# Patient Record
Sex: Male | Born: 1985 | Race: White | Hispanic: No | Marital: Single | Smoking: Current every day smoker
Health system: Southern US, Community
[De-identification: ages and names within clinical notes are randomized; demographics above are authoritative.]

## PROBLEM LIST (undated history)

## (undated) DIAGNOSIS — Z952 Presence of prosthetic heart valve: Secondary | ICD-10-CM

## (undated) DIAGNOSIS — I2699 Other pulmonary embolism without acute cor pulmonale: Secondary | ICD-10-CM

## (undated) DIAGNOSIS — I519 Heart disease, unspecified: Secondary | ICD-10-CM

## (undated) DIAGNOSIS — B1920 Unspecified viral hepatitis C without hepatic coma: Secondary | ICD-10-CM

## (undated) DIAGNOSIS — D649 Anemia, unspecified: Secondary | ICD-10-CM

## (undated) DIAGNOSIS — F32A Depression, unspecified: Secondary | ICD-10-CM

## (undated) DIAGNOSIS — F1910 Other psychoactive substance abuse, uncomplicated: Secondary | ICD-10-CM

## (undated) DIAGNOSIS — Z8679 Personal history of other diseases of the circulatory system: Secondary | ICD-10-CM

## (undated) DIAGNOSIS — Z8674 Personal history of sudden cardiac arrest: Secondary | ICD-10-CM

## (undated) DIAGNOSIS — R519 Headache, unspecified: Secondary | ICD-10-CM

## (undated) DIAGNOSIS — I428 Other cardiomyopathies: Principal | ICD-10-CM

## (undated) DIAGNOSIS — F329 Major depressive disorder, single episode, unspecified: Secondary | ICD-10-CM

## (undated) DIAGNOSIS — F419 Anxiety disorder, unspecified: Secondary | ICD-10-CM

## (undated) DIAGNOSIS — I219 Acute myocardial infarction, unspecified: Secondary | ICD-10-CM

## (undated) DIAGNOSIS — R51 Headache: Secondary | ICD-10-CM

## (undated) HISTORY — DX: Other cardiomyopathies: I42.8

## (undated) HISTORY — DX: Heart disease, unspecified: I51.9

## (undated) HISTORY — DX: Other pulmonary embolism without acute cor pulmonale: I26.99

## (undated) HISTORY — PX: KNEE ARTHROSCOPY: SHX127

## (undated) HISTORY — DX: Presence of prosthetic heart valve: Z95.2

---

## 2013-01-11 ENCOUNTER — Encounter (HOSPITAL_COMMUNITY): Payer: Self-pay | Admitting: Cardiology

## 2013-01-11 ENCOUNTER — Emergency Department (HOSPITAL_COMMUNITY)
Admission: EM | Admit: 2013-01-11 | Discharge: 2013-01-11 | Disposition: A | Discharge status: Home / Self Care | Payer: BC Managed Care – PPO | Attending: Emergency Medicine | Admitting: Emergency Medicine

## 2013-01-11 DIAGNOSIS — F1910 Other psychoactive substance abuse, uncomplicated: Secondary | ICD-10-CM | POA: Insufficient documentation

## 2013-01-11 DIAGNOSIS — F17200 Nicotine dependence, unspecified, uncomplicated: Secondary | ICD-10-CM | POA: Insufficient documentation

## 2013-01-11 LAB — COMPREHENSIVE METABOLIC PANEL
AST: 31 U/L (ref 0–37)
Albumin: 4.4 g/dL (ref 3.5–5.2)
CO2: 31 mEq/L (ref 19–32)
Calcium: 9.7 mg/dL (ref 8.4–10.5)
Creatinine, Ser: 0.89 mg/dL (ref 0.50–1.35)
GFR calc non Af Amer: >90 mL/min (ref >90)
Total Protein: 7.6 g/dL (ref 6.0–8.3)

## 2013-01-11 LAB — CBC
MCH: 29.5 pg (ref 26.0–34.0)
MCHC: 34 g/dL (ref 30.0–36.0)
MCV: 86.9 fL (ref 78.0–100.0)
Platelets: 292 10*3/uL (ref 150–400)
RDW: 13.5 % (ref 11.5–15.5)

## 2013-01-11 LAB — RAPID URINE DRUG SCREEN, HOSP PERFORMED
Amphetamines: NOT DETECTED
Opiates: NOT DETECTED

## 2013-01-11 LAB — SALICYLATE LEVEL: Salicylate Lvl: <2 mg/dL — ABNORMAL LOW (ref 2.8–20.0)

## 2013-01-11 NOTE — ED Provider Notes (Signed)
Medical screening examination/treatment/procedure(s) were performed by non-physician practitioner and as supervising physician I was immediately available for consultation/collaboration.  Doug Sou, MD 01/11/13 [phone]

## 2013-01-11 NOTE — ED Notes (Signed)
PJ with ACT team at bedside.

## 2013-01-11 NOTE — ED Provider Notes (Signed)
History     CSN: [id number]  Arrival date & time 01/11/13  1204   First MD Initiated Contact with Patient 01/11/13 1232      Chief Complaint  Patient presents with  . Medical Clearance    (Consider location/radiation/quality/duration/timing/severity/associated sxs/prior treatment) HPI Comments: Patient is a 27 year old male who presents for medical clearance for substance abuse. Patient reports abusing prescription pain medication such as Percocet. Patient reports using them for 3 years. He also admits to using crystal meth occasionally. Patient denies alcohol abuse. He reports not using since November 29, 2012 but would like to be medically cleared to enter a treatment program. Patient denies SI/HI. No medical problems. No associated symptoms.    History reviewed. No pertinent past medical history.  History reviewed. No pertinent past surgical history.  History reviewed. No pertinent family history.  History  Substance Use Topics  . Smoking status: Current Every Day Smoker  . Smokeless tobacco: Not on file  . Alcohol Use: No      Review of Systems  Psychiatric/Behavioral:       Substance abuse.   All other systems reviewed and are negative.    Allergies  Penicillins  Home Medications  No current outpatient prescriptions on file.  BP 123/73  Pulse 87  Temp 98.1 F (36.7 C) (Oral)  Resp 20  SpO2 99%  Physical Exam  Nursing note and vitals reviewed. Constitutional: He is oriented to person, place, and time. He appears well-developed and well-nourished. No distress.  HENT:  Head: Normocephalic and atraumatic.  Eyes: Conjunctivae normal and EOM are normal.  Neck: Normal range of motion.  Cardiovascular: Normal rate and regular rhythm.  Exam reveals no gallop and no friction rub.   No murmur heard. Pulmonary/Chest: Effort normal and breath sounds normal. He has no wheezes. He has no rales. He exhibits no tenderness.  Abdominal: Soft. There is no tenderness.    Musculoskeletal: Normal range of motion.  Neurological: He is alert and oriented to person, place, and time. Coordination normal.       Speech is goal-oriented. Moves limbs without ataxia.   Skin: Skin is warm and dry.  Psychiatric: He has a normal mood and affect. His behavior is normal.    ED Course  Procedures (including critical care time)  Labs Reviewed  COMPREHENSIVE METABOLIC PANEL - Abnormal; Notable for the following:    Glucose, Bld 133 (*)     All other components within normal limits  SALICYLATE LEVEL - Abnormal; Notable for the following:    Salicylate Lvl <2.0 (*)     All other components within normal limits  ACETAMINOPHEN LEVEL  CBC  ETHANOL  URINE RAPID DRUG SCREEN (HOSP PERFORMED)   No results found.   1. Polysubstance abuse       MDM  3:26 PM Patient is medically cleared and will move to Pod C for ACT team assessment.    3:50 PM ACT team saw the patient who reports the patient would be most appropriate for outpatient resources. He would not benefit from staying here or inpatient treatment. Baxter Hire will talk to the patient to discuss resources and he will be discharged without further evaluation.      Emilia Beck, PA-C 01/11/13 [address], PA-C 01/11/13 1556

## 2013-01-11 NOTE — ED Notes (Signed)
Pt is here for medical clearance to get into a rehab program.

## 2013-01-11 NOTE — ED Notes (Signed)
Blood tube selection confirmed by lindsay/rn.

## 2013-01-11 NOTE — ED Notes (Signed)
Maxwell Lemen (Mom) [phone], Jachob Mcclean (Dad) [phone] request to be contacted if pt finds placement or change in status.

## 2013-01-11 NOTE — ED Notes (Signed)
Pt here stating that he needs to be medically cleared for acceptance into a rehab facility. States he was using prescription drugs but has been clean since December. States he has been looking into places but has not been accepted yet. Denies any SI/HI. Pt calm and cooperative at triage.

## 2013-01-11 NOTE — ED Notes (Signed)
Discharge instructions reviewed. Pt verbalized understanding of follow up instructions.

## 2014-05-23 ENCOUNTER — Other Ambulatory Visit: Payer: Self-pay | Admitting: Radiology

## 2015-05-22 DIAGNOSIS — B1920 Unspecified viral hepatitis C without hepatic coma: Secondary | ICD-10-CM | POA: Insufficient documentation

## 2015-05-24 DIAGNOSIS — R1011 Right upper quadrant pain: Secondary | ICD-10-CM | POA: Insufficient documentation

## 2015-06-19 DIAGNOSIS — R7881 Bacteremia: Secondary | ICD-10-CM | POA: Insufficient documentation

## 2015-10-16 DIAGNOSIS — I5189 Other ill-defined heart diseases: Secondary | ICD-10-CM

## 2015-10-16 DIAGNOSIS — I428 Other cardiomyopathies: Secondary | ICD-10-CM

## 2015-10-16 HISTORY — DX: Other cardiomyopathies: I42.8

## 2015-10-16 HISTORY — PX: TRANSTHORACIC ECHOCARDIOGRAM: SHX275

## 2015-10-16 HISTORY — DX: Other ill-defined heart diseases: I51.89

## 2015-11-07 DIAGNOSIS — Z87898 Personal history of other specified conditions: Secondary | ICD-10-CM | POA: Insufficient documentation

## 2015-11-07 DIAGNOSIS — Z8679 Personal history of other diseases of the circulatory system: Secondary | ICD-10-CM | POA: Insufficient documentation

## 2015-11-07 DIAGNOSIS — E869 Volume depletion, unspecified: Secondary | ICD-10-CM | POA: Insufficient documentation

## 2015-11-07 DIAGNOSIS — J181 Lobar pneumonia, unspecified organism: Secondary | ICD-10-CM

## 2015-11-07 DIAGNOSIS — J189 Pneumonia, unspecified organism: Secondary | ICD-10-CM | POA: Insufficient documentation

## 2015-11-07 DIAGNOSIS — R609 Edema, unspecified: Secondary | ICD-10-CM | POA: Insufficient documentation

## 2015-11-08 DIAGNOSIS — F1110 Opioid abuse, uncomplicated: Secondary | ICD-10-CM | POA: Insufficient documentation

## 2015-11-08 DIAGNOSIS — I2690 Septic pulmonary embolism without acute cor pulmonale: Secondary | ICD-10-CM | POA: Insufficient documentation

## 2015-11-08 DIAGNOSIS — F1990 Other psychoactive substance use, unspecified, uncomplicated: Secondary | ICD-10-CM | POA: Insufficient documentation

## 2015-11-08 DIAGNOSIS — F419 Anxiety disorder, unspecified: Secondary | ICD-10-CM | POA: Insufficient documentation

## 2015-11-10 DIAGNOSIS — Z9889 Other specified postprocedural states: Secondary | ICD-10-CM | POA: Insufficient documentation

## 2015-11-11 DIAGNOSIS — R52 Pain, unspecified: Secondary | ICD-10-CM | POA: Insufficient documentation

## 2015-11-12 DIAGNOSIS — Z22322 Carrier or suspected carrier of Methicillin resistant Staphylococcus aureus: Secondary | ICD-10-CM | POA: Insufficient documentation

## 2015-11-13 DIAGNOSIS — D62 Acute posthemorrhagic anemia: Secondary | ICD-10-CM | POA: Insufficient documentation

## 2015-11-15 DIAGNOSIS — Z952 Presence of prosthetic heart valve: Secondary | ICD-10-CM

## 2015-11-15 HISTORY — DX: Presence of prosthetic heart valve: Z95.2

## 2015-11-15 HISTORY — PX: CARDIOVERSION: SHX1299

## 2015-11-15 HISTORY — PX: CARDIAC VALVE REPLACEMENT: SHX585

## 2015-11-15 HISTORY — PX: CARDIAC CATHETERIZATION: SHX172

## 2015-11-24 DIAGNOSIS — J90 Pleural effusion, not elsewhere classified: Secondary | ICD-10-CM | POA: Insufficient documentation

## 2016-01-16 HISTORY — PX: TRANSTHORACIC ECHOCARDIOGRAM: SHX275

## 2016-01-21 DIAGNOSIS — R0602 Shortness of breath: Secondary | ICD-10-CM | POA: Insufficient documentation

## 2016-04-07 DIAGNOSIS — I480 Paroxysmal atrial fibrillation: Secondary | ICD-10-CM | POA: Insufficient documentation

## 2017-07-22 DIAGNOSIS — A419 Sepsis, unspecified organism: Secondary | ICD-10-CM

## 2017-07-22 DIAGNOSIS — Z952 Presence of prosthetic heart valve: Secondary | ICD-10-CM | POA: Diagnosis not present

## 2017-07-22 DIAGNOSIS — N179 Acute kidney failure, unspecified: Secondary | ICD-10-CM | POA: Diagnosis not present

## 2017-07-22 DIAGNOSIS — F1510 Other stimulant abuse, uncomplicated: Secondary | ICD-10-CM | POA: Diagnosis not present

## 2017-07-22 DIAGNOSIS — E871 Hypo-osmolality and hyponatremia: Secondary | ICD-10-CM

## 2017-07-22 DIAGNOSIS — I2699 Other pulmonary embolism without acute cor pulmonale: Secondary | ICD-10-CM | POA: Diagnosis not present

## 2017-07-23 DIAGNOSIS — I2699 Other pulmonary embolism without acute cor pulmonale: Secondary | ICD-10-CM | POA: Diagnosis not present

## 2017-07-23 DIAGNOSIS — R509 Fever, unspecified: Secondary | ICD-10-CM | POA: Diagnosis not present

## 2017-07-23 DIAGNOSIS — Z952 Presence of prosthetic heart valve: Secondary | ICD-10-CM | POA: Diagnosis not present

## 2017-07-23 DIAGNOSIS — F1510 Other stimulant abuse, uncomplicated: Secondary | ICD-10-CM | POA: Diagnosis not present

## 2017-07-23 DIAGNOSIS — E871 Hypo-osmolality and hyponatremia: Secondary | ICD-10-CM | POA: Diagnosis not present

## 2017-07-23 DIAGNOSIS — N179 Acute kidney failure, unspecified: Secondary | ICD-10-CM | POA: Diagnosis not present

## 2017-07-23 DIAGNOSIS — A419 Sepsis, unspecified organism: Secondary | ICD-10-CM | POA: Diagnosis not present

## 2017-07-24 DIAGNOSIS — N179 Acute kidney failure, unspecified: Secondary | ICD-10-CM | POA: Diagnosis not present

## 2017-07-24 DIAGNOSIS — Z952 Presence of prosthetic heart valve: Secondary | ICD-10-CM | POA: Diagnosis not present

## 2017-07-24 DIAGNOSIS — E871 Hypo-osmolality and hyponatremia: Secondary | ICD-10-CM | POA: Diagnosis not present

## 2017-07-24 DIAGNOSIS — A419 Sepsis, unspecified organism: Secondary | ICD-10-CM | POA: Diagnosis not present

## 2017-07-25 DIAGNOSIS — A419 Sepsis, unspecified organism: Secondary | ICD-10-CM | POA: Diagnosis not present

## 2017-07-25 DIAGNOSIS — Z952 Presence of prosthetic heart valve: Secondary | ICD-10-CM | POA: Diagnosis not present

## 2017-07-25 DIAGNOSIS — E871 Hypo-osmolality and hyponatremia: Secondary | ICD-10-CM | POA: Diagnosis not present

## 2017-07-25 DIAGNOSIS — N179 Acute kidney failure, unspecified: Secondary | ICD-10-CM | POA: Diagnosis not present

## 2017-07-26 ENCOUNTER — Inpatient Hospital Stay (HOSPITAL_COMMUNITY)
Admission: AD | Admit: 2017-07-26 | Discharge: 2017-07-31 | DRG: 314 | Discharge status: Against Medical Advice | Payer: BLUE CROSS/BLUE SHIELD | Source: Other Acute Inpatient Hospital | Attending: Internal Medicine | Admitting: Internal Medicine

## 2017-07-26 ENCOUNTER — Encounter (HOSPITAL_COMMUNITY): Payer: Self-pay | Admitting: Internal Medicine

## 2017-07-26 DIAGNOSIS — T80219A Unspecified infection due to central venous catheter, initial encounter: Secondary | ICD-10-CM | POA: Diagnosis present

## 2017-07-26 DIAGNOSIS — T826XXA Infection and inflammatory reaction due to cardiac valve prosthesis, initial encounter: Principal | ICD-10-CM | POA: Diagnosis present

## 2017-07-26 DIAGNOSIS — G894 Chronic pain syndrome: Secondary | ICD-10-CM | POA: Diagnosis present

## 2017-07-26 DIAGNOSIS — Z952 Presence of prosthetic heart valve: Secondary | ICD-10-CM

## 2017-07-26 DIAGNOSIS — Z880 Allergy status to penicillin: Secondary | ICD-10-CM | POA: Diagnosis not present

## 2017-07-26 DIAGNOSIS — D696 Thrombocytopenia, unspecified: Secondary | ICD-10-CM | POA: Diagnosis present

## 2017-07-26 DIAGNOSIS — I2699 Other pulmonary embolism without acute cor pulmonale: Secondary | ICD-10-CM | POA: Diagnosis present

## 2017-07-26 DIAGNOSIS — Z9114 Patient's other noncompliance with medication regimen: Secondary | ICD-10-CM | POA: Diagnosis not present

## 2017-07-26 DIAGNOSIS — F17200 Nicotine dependence, unspecified, uncomplicated: Secondary | ICD-10-CM | POA: Diagnosis not present

## 2017-07-26 DIAGNOSIS — Y831 Surgical operation with implant of artificial internal device as the cause of abnormal reaction of the patient, or of later complication, without mention of misadventure at the time of the procedure: Secondary | ICD-10-CM | POA: Diagnosis present

## 2017-07-26 DIAGNOSIS — F1590 Other stimulant use, unspecified, uncomplicated: Secondary | ICD-10-CM | POA: Diagnosis present

## 2017-07-26 DIAGNOSIS — R451 Restlessness and agitation: Secondary | ICD-10-CM | POA: Diagnosis not present

## 2017-07-26 DIAGNOSIS — Z8674 Personal history of sudden cardiac arrest: Secondary | ICD-10-CM

## 2017-07-26 DIAGNOSIS — B9561 Methicillin susceptible Staphylococcus aureus infection as the cause of diseases classified elsewhere: Secondary | ICD-10-CM

## 2017-07-26 DIAGNOSIS — N179 Acute kidney failure, unspecified: Secondary | ICD-10-CM | POA: Diagnosis not present

## 2017-07-26 DIAGNOSIS — Y848 Other medical procedures as the cause of abnormal reaction of the patient, or of later complication, without mention of misadventure at the time of the procedure: Secondary | ICD-10-CM | POA: Diagnosis present

## 2017-07-26 DIAGNOSIS — I330 Acute and subacute infective endocarditis: Secondary | ICD-10-CM | POA: Diagnosis present

## 2017-07-26 DIAGNOSIS — F1910 Other psychoactive substance abuse, uncomplicated: Secondary | ICD-10-CM | POA: Diagnosis not present

## 2017-07-26 DIAGNOSIS — Z79899 Other long term (current) drug therapy: Secondary | ICD-10-CM

## 2017-07-26 DIAGNOSIS — L52 Erythema nodosum: Secondary | ICD-10-CM | POA: Diagnosis present

## 2017-07-26 DIAGNOSIS — R197 Diarrhea, unspecified: Secondary | ICD-10-CM | POA: Diagnosis not present

## 2017-07-26 DIAGNOSIS — A419 Sepsis, unspecified organism: Secondary | ICD-10-CM | POA: Diagnosis present

## 2017-07-26 DIAGNOSIS — B182 Chronic viral hepatitis C: Secondary | ICD-10-CM | POA: Diagnosis present

## 2017-07-26 DIAGNOSIS — I5022 Chronic systolic (congestive) heart failure: Secondary | ICD-10-CM | POA: Diagnosis present

## 2017-07-26 DIAGNOSIS — R791 Abnormal coagulation profile: Secondary | ICD-10-CM | POA: Diagnosis present

## 2017-07-26 DIAGNOSIS — A4101 Sepsis due to Methicillin susceptible Staphylococcus aureus: Secondary | ICD-10-CM | POA: Diagnosis not present

## 2017-07-26 DIAGNOSIS — F419 Anxiety disorder, unspecified: Secondary | ICD-10-CM | POA: Diagnosis not present

## 2017-07-26 DIAGNOSIS — D649 Anemia, unspecified: Secondary | ICD-10-CM | POA: Diagnosis not present

## 2017-07-26 DIAGNOSIS — E871 Hypo-osmolality and hyponatremia: Secondary | ICD-10-CM | POA: Diagnosis present

## 2017-07-26 DIAGNOSIS — T82594A Other mechanical complication of infusion catheter, initial encounter: Secondary | ICD-10-CM

## 2017-07-26 DIAGNOSIS — Z954 Presence of other heart-valve replacement: Secondary | ICD-10-CM

## 2017-07-26 DIAGNOSIS — I480 Paroxysmal atrial fibrillation: Secondary | ICD-10-CM | POA: Diagnosis present

## 2017-07-26 DIAGNOSIS — F1123 Opioid dependence with withdrawal: Secondary | ICD-10-CM | POA: Diagnosis not present

## 2017-07-26 DIAGNOSIS — I38 Endocarditis, valve unspecified: Secondary | ICD-10-CM

## 2017-07-26 HISTORY — DX: Anemia, unspecified: D64.9

## 2017-07-26 HISTORY — DX: Unspecified viral hepatitis C without hepatic coma: B19.20

## 2017-07-26 HISTORY — DX: Other psychoactive substance abuse, uncomplicated: F19.10

## 2017-07-26 HISTORY — DX: Personal history of sudden cardiac arrest: Z86.74

## 2017-07-26 LAB — COMPREHENSIVE METABOLIC PANEL
ALBUMIN: 2.9 g/dL — AB (ref 3.5–5.0)
ALK PHOS: 105 U/L (ref 38–126)
ALT: 12 U/L — ABNORMAL LOW (ref 17–63)
AST: 21 U/L (ref 15–41)
Anion gap: 8 (ref 5–15)
BILIRUBIN TOTAL: 2.7 mg/dL — AB (ref 0.3–1.2)
BUN: 9 mg/dL (ref 6–20)
CALCIUM: 8.1 mg/dL — AB (ref 8.9–10.3)
CO2: 24 mmol/L (ref 22–32)
Chloride: 102 mmol/L (ref 101–111)
Creatinine, Ser: 0.93 mg/dL (ref 0.61–1.24)
GFR calc Af Amer: >60 mL/min (ref >60)
GFR calc non Af Amer: >60 mL/min (ref >60)
GLUCOSE: 114 mg/dL — AB (ref 65–99)
POTASSIUM: 3.6 mmol/L (ref 3.5–5.1)
SODIUM: 134 mmol/L — AB (ref 135–145)
Total Protein: 6.2 g/dL — ABNORMAL LOW (ref 6.5–8.1)

## 2017-07-26 LAB — MAGNESIUM: Magnesium: 1.5 mg/dL — ABNORMAL LOW (ref 1.7–2.4)

## 2017-07-26 LAB — PROTIME-INR
INR: 3.8
Prothrombin Time: 38.4 seconds — ABNORMAL HIGH (ref 11.4–15.2)

## 2017-07-26 MED ORDER — ACETAMINOPHEN 325 MG PO TABS
650.0000 mg | ORAL_TABLET | Freq: Four times a day (QID) | ORAL | Status: DC | PRN
  Administered 2017-07-27 (×2): 650 mg via ORAL
  Filled 2017-07-26 (×2): qty 2

## 2017-07-26 MED ORDER — KETOROLAC TROMETHAMINE 15 MG/ML IJ SOLN
15.0000 mg | Freq: Once | INTRAMUSCULAR | Status: AC
  Administered 2017-07-26: 15 mg via INTRAVENOUS
  Filled 2017-07-26: qty 1

## 2017-07-26 MED ORDER — MAGNESIUM SULFATE 4 GM/100ML IV SOLN
4.0000 g | Freq: Once | INTRAVENOUS | Status: AC
  Administered 2017-07-27: 4 g via INTRAVENOUS
  Filled 2017-07-26: qty 100

## 2017-07-26 MED ORDER — MORPHINE SULFATE (PF) 2 MG/ML IV SOLN
2.0000 mg | Freq: Once | INTRAVENOUS | Status: AC
  Administered 2017-07-26: 2 mg via INTRAVENOUS
  Filled 2017-07-26: qty 1

## 2017-07-26 MED ORDER — DEXTROSE 5 % IV SOLN
2.0000 g | Freq: Two times a day (BID) | INTRAVENOUS | Status: DC
  Administered 2017-07-26 – 2017-07-29 (×6): 2 g via INTRAVENOUS
  Filled 2017-07-26 (×8): qty 2

## 2017-07-26 MED ORDER — SODIUM CHLORIDE 0.9 % IV BOLUS (SEPSIS)
1000.0000 mL | Freq: Once | INTRAVENOUS | Status: AC
  Administered 2017-07-27: 1000 mL via INTRAVENOUS

## 2017-07-26 MED ORDER — PROCHLORPERAZINE EDISYLATE 5 MG/ML IJ SOLN
10.0000 mg | Freq: Four times a day (QID) | INTRAMUSCULAR | Status: DC | PRN
  Filled 2017-07-26: qty 2

## 2017-07-26 MED ORDER — POTASSIUM CHLORIDE IN NACL 20-0.9 MEQ/L-% IV SOLN
INTRAVENOUS | Status: DC
  Administered 2017-07-27: 1000 mL via INTRAVENOUS
  Filled 2017-07-26: qty 1000

## 2017-07-26 MED ORDER — POTASSIUM CHLORIDE CRYS ER 20 MEQ PO TBCR
20.0000 meq | EXTENDED_RELEASE_TABLET | Freq: Once | ORAL | Status: AC
  Administered 2017-07-27: 20 meq via ORAL
  Filled 2017-07-26: qty 1

## 2017-07-26 NOTE — H&P (Signed)
History and Physical    Norman Reed RVU:[id number] DOB: 02/17/1986 DOA: 07/26/2017  PCP: No primary care provider on file.   Patient coming from: [facility] ICU.  I have personally briefly reviewed patient's old medical records in [facility] Health Link  Chief Complaint: MSSA endocarditis.  HPI: Norman Reed is a 31 y.o. male with medical history significant of anemia, chronic systolic heart failure, history of hepatitis C, history of cardiac arrest, history of mitral valve and tricuspid valve repair at [facility] 2 years ago by Dr. Lamonte Sakai, history of IVDA and polysubstance abuse who was admitted on 07/22/2017 to [facility] with complaints of fever, chills, malaise, myalgias, emesis and intermittent chest pain for 2-3 days. Prior to that, he was not taking his carvedilol and warfarin for several weeks. He has been using IV heroin and methamphetamines again.   At [facility], he was treated for the past few days with cefazolin, gentamicin and oral rifampin for and MSSA bacteremia after discussion with infectious diseases. Arrangements for transfer to [facility] were made, but unable to be done due to very tight availability of beds. Echocardiogram showed an EF of 25-30%, a septal bounce, a thickened mechanical mitral valve with agitation measuring 14 x 6 mm. The tricuspid valve appeared to have some thickening as well. He was subsequently transferred to this facility for higher level of care, formal consultation with infectious diseases and consultation with cardiothoracic surgery.  When seen, the patient still complaining of intermittent chest pain, fever, chills, fatigue, body aches, mild nausea and malaise. He denies headache, sore throat, productive cough, abdominal pain, melena, hematochezia, dysuria, frequency, hematuria, polyuria, polydipsia or blurred vision.  Review of Systems: As per HPI otherwise 10 point review of systems negative.    Past  Medical History:  Diagnosis Date  . Anemia   . Chronic systolic heart failure (HCC)   . Hepatitis C   . History of cardiac arrest   . IV drug abuse     Past Surgical History:  Procedure Laterality Date  . MITRAL VALVE REPAIR    . TRICUSPID VALVE REPLACEMENT       reports that he has been smoking.  He has been smoking about 0.50 packs per day. He has never used smokeless tobacco. He reports that he uses drugs, including Methamphetamines and Marijuana. He reports that he does not drink alcohol.  Allergies  Allergen Reactions  . Penicillins     Childhood reaction    Family History  Problem Relation Age of Onset  . Hyperlipidemia Maternal Grandmother   . Hyperlipidemia Paternal Grandfather     Prior to Admission medications   Medication Sig Start Date End Date Taking? Authorizing Provider  acetaminophen (TYLENOL) 325 MG tablet Take 650 mg by mouth every 6 (six) hours as needed for mild pain or fever.   Yes [provider]  bisacodyl (DULCOLAX) 5 MG EC tablet Take 10 mg by mouth daily as needed for moderate constipation.   Yes [provider]  ceFAZolin (ANCEF) IVPB Inject 2 g into the vein every 8 (eight) hours.   Yes [provider]  chlordiazePOXIDE (LIBRIUM) 10 MG capsule Take 10 mg by mouth 3 (three) times daily.   Yes [provider]  gentamicin (GARAMYCIN) IVPB Inject 80 mg into the vein every 12 (twelve) hours.   Yes [provider]  hydrOXYzine (ATARAX/VISTARIL) 25 MG tablet Take 25 mg by mouth every 6 (six) hours as needed for anxiety.   Yes [provider]  LORazepam (ATIVAN) 1 MG tablet Take 1 mg by mouth every 4 (four) hours as needed for anxiety.   Yes [provider]  oxycodone (OXY-IR) 5 MG capsule Take 5 mg by mouth every 4 (four) hours as needed for pain.   Yes [provider]  rifampin (RIFADIN) 300 MG capsule Take 300 mg by mouth 3 (three) times daily.   Yes [provider]    warfarin (COUMADIN) 5 MG tablet Take 5 mg by mouth daily.   Yes [provider]    Physical Exam: Vitals:   07/26/17 2130 07/26/17 2342  BP: (!) 100/46 (!) 105/52  Pulse: 94   Resp: (!) 28   Temp: 100 F (37.8 C) 98.7 F (37.1 C)  TempSrc: Oral Oral  SpO2: 100%     Constitutional: Febrile, looks acutely ill. Eyes: PERRL, lids and conjunctivae normal ENMT: Mucous membranes are mildly dry. Posterior pharynx clear of any exudate or lesions. Neck: normal, supple, no masses, no thyromegaly Respiratory: clear to auscultation bilaterally, no wheezing, no crackles. Normal respiratory effort. No accessory muscle use.  Cardiovascular: Regular rate and rhythm, Soft diastolic murmur, no rubs / gallops. No extremity edema. 2+ pedal pulses. No carotid bruits.  Abdomen: Soft, no tenderness, no masses palpated. No hepatosplenomegaly. Bowel sounds positive.  Musculoskeletal: no clubbing / cyanosis. Good ROM, no contractures. Normal muscle tone.  Skin: Scattered small areas of ecchymosis on extremities, no significant rashes, lesions, ulcers otherwise. Neurologic: CN 2-12 grossly intact. Sensation intact, DTR normal. Strength 5/5 in all 4.  Psychiatric: Normal judgment and insight. Alert and oriented x 4. Mildly anxious mood.     Labs on Admission: I have personally reviewed following labs and imaging studies  CBC:  Recent Labs Lab 07/26/17 2235  WBC 7.3  NEUTROABS 4.0  HGB 10.9*  HCT 31.2*  MCV 77.6*  PLT PENDING   Basic Metabolic Panel:  Recent Labs Lab 07/26/17 2235  NA 134*  K 3.6  CL 102  CO2 24  GLUCOSE 114*  BUN 9  CREATININE 0.93  CALCIUM 8.1*  MG 1.5*   GFR: CrCl cannot be calculated (Unknown ideal weight.). Liver Function Tests:  Recent Labs Lab 07/26/17 2235  AST 21  ALT 12*  ALKPHOS 105  BILITOT 2.7*  PROT 6.2*  ALBUMIN 2.9*   No results for input(s): LIPASE, AMYLASE in the last 168 hours. No results for input(s): AMMONIA in the last 168  hours. Coagulation Profile:  Recent Labs Lab 07/26/17 2213  INR 3.80   Cardiac Enzymes: No results for input(s): CKTOTAL, CKMB, CKMBINDEX, TROPONINI in the last 168 hours. BNP (last 3 results) No results for input(s): PROBNP in the last 8760 hours. HbA1C: No results for input(s): HGBA1C in the last 72 hours. CBG: No results for input(s): GLUCAP in the last 168 hours. Lipid Profile: No results for input(s): CHOL, HDL, LDLCALC, TRIG, CHOLHDL, LDLDIRECT in the last 72 hours. Thyroid Function Tests: No results for input(s): TSH, T4TOTAL, FREET4, T3FREE, THYROIDAB in the last 72 hours. Anemia Panel: No results for input(s): VITAMINB12, FOLATE, FERRITIN, TIBC, IRON, RETICCTPCT in the last 72 hours. Urine analysis: No results found for: COLORURINE, APPEARANCEUR, LABSPEC, PHURINE, GLUCOSEU, HGBUR, BILIRUBINUR, KETONESUR, PROTEINUR, UROBILINOGEN, NITRITE, LEUKOCYTESUR  Radiological Exams on Admission: No results found.    EKG: Independently reviewed.   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 Gwen Her Dam who is familiar with the patient, as  he was seen telephonic contact with Dr. Susa Raring at [facility]. Admit to stepdown/inpatient. Continue gentle IV hydration. Continue rifampicin 300 mg by mouth 3 times a day. Continue gentamicin per pharmacy. Start ceftriaxone 2 g IVPB every 12 hours. Request final blood cultures report from [place] when available.  Active Problems:   Chronic systolic heart failure (HCC) Secondary to valvular heart disease and multiple endocarditis episodes. Echocardiogram at [facility] show severe global hypokinesis of the left ventricle with an EF of 25-30%. There was a septal bounce present, a vegetation of the mitral valve and thickened tricuspid valve.  Continue gentle IV hydration with close intake and output  monitoring.    Anemia Monitor hematocrit and hemoglobin.    Hypomagnesemia Replacement ordered. Follow up magnesium level as needed.    Hyponatremia Improved. Gentle IV hydration. Monitor sodium level, intake and output.     History of mitral valve replacement   History of tricuspid valve replacement Poor surgical candidate given her persistent history of IVDA. Please consult cardiothoracic surgery in a.m. for evaluation.    Thrombocytopenia (HCC) There was suspicion for sepsis related thrombocytopenia. Low suspicion for heparin-induced thrombocytopenia. However antibodies were ordered.  Please follow up results, when available from [facility]. Currently on warfarin with supratherapeutic INR. Monitor platelet count and PT/INR daily.    Polysubstance abuse Initially shows signs of opioid withdrawal at [facility]. Continue low-dose opiates, Librium and when necessary lorazepam. Nicotine replacement therapy was ordered.   DVT prophylaxis: On warfarin. Code Status: Full code. Family Communication:  Disposition Plan: Admit to continue IV antibiotic therapy. ID and CTS evaluation in a.m. Consults called: Dr. Gwen Her Dam (infectious diseases).  Admission status: Inpatient/stepdown.    Bobette Mo MD [facility] Pager [phone].  If 7PM-7AM, please contact night-coverage [email or web address] Password [facility]  07/26/2017, 11:50 PM

## 2017-07-26 NOTE — Progress Notes (Signed)
Pt admitted from [facility]. Pt wipe down with CHG wipes. Pt stated does not feel good, says he is in pains and would like some pain meds, denies any headache, cp, no SOB.  Temp 100. No orders have been put in currently. Call light within reach. Advised pt to call if he need help. Will continue to monitor pt.

## 2017-07-26 NOTE — Progress Notes (Signed)
Pharmacy Antibiotic Note  Norman Reed is a 31 y.o. 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  Pharmacy has been consulted for gentamicin dosing.  Was on Ancef at OSH and did well for 2d then spiked temp >> tx'd for formal ID and CVTS consults.  Labs from OSH: WBC 6.7, Hgb 11.1, Plt 84, Na 135, K 3.4, SCr 0.8, alb 3.0,   Plan: Has been on gentamicin 120mg  IV Q12H with trough of 1.2, last dose 8/12 0820.  Will continue for now. Starting on Rocephin 2g IV Q12H per admitting MD.   Temp (24hrs), Avg:100 F (37.8 C), Min:100 F (37.8 C), Max:100 F (37.8 C)   Recent Labs Lab 07/26/17 2235  WBC 7.3  CREATININE 0.93     Allergies  Allergen Reactions  . Penicillins     Childhood reaction    Thank you for allowing pharmacy to be a part of this patient's care.  Vernard Gambles, PharmD, BCPS  07/26/2017 11:35 PM

## 2017-07-27 ENCOUNTER — Inpatient Hospital Stay (HOSPITAL_COMMUNITY): Payer: BLUE CROSS/BLUE SHIELD

## 2017-07-27 ENCOUNTER — Encounter (HOSPITAL_COMMUNITY): Payer: Self-pay | Admitting: *Deleted

## 2017-07-27 DIAGNOSIS — T826XXA Infection and inflammatory reaction due to cardiac valve prosthesis, initial encounter: Secondary | ICD-10-CM | POA: Diagnosis present

## 2017-07-27 DIAGNOSIS — I350 Nonrheumatic aortic (valve) stenosis: Secondary | ICD-10-CM

## 2017-07-27 DIAGNOSIS — Z880 Allergy status to penicillin: Secondary | ICD-10-CM | POA: Diagnosis not present

## 2017-07-27 DIAGNOSIS — F1123 Opioid dependence with withdrawal: Secondary | ICD-10-CM | POA: Diagnosis present

## 2017-07-27 DIAGNOSIS — I480 Paroxysmal atrial fibrillation: Secondary | ICD-10-CM | POA: Diagnosis present

## 2017-07-27 DIAGNOSIS — A419 Sepsis, unspecified organism: Secondary | ICD-10-CM | POA: Diagnosis present

## 2017-07-27 DIAGNOSIS — D649 Anemia, unspecified: Secondary | ICD-10-CM | POA: Diagnosis present

## 2017-07-27 DIAGNOSIS — I2609 Other pulmonary embolism with acute cor pulmonale: Secondary | ICD-10-CM | POA: Diagnosis not present

## 2017-07-27 DIAGNOSIS — T826XXD Infection and inflammatory reaction due to cardiac valve prosthesis, subsequent encounter: Secondary | ICD-10-CM | POA: Diagnosis not present

## 2017-07-27 DIAGNOSIS — T80219A Unspecified infection due to central venous catheter, initial encounter: Secondary | ICD-10-CM | POA: Diagnosis present

## 2017-07-27 DIAGNOSIS — L52 Erythema nodosum: Secondary | ICD-10-CM | POA: Diagnosis present

## 2017-07-27 DIAGNOSIS — A4101 Sepsis due to Methicillin susceptible Staphylococcus aureus: Secondary | ICD-10-CM | POA: Diagnosis present

## 2017-07-27 DIAGNOSIS — F419 Anxiety disorder, unspecified: Secondary | ICD-10-CM | POA: Diagnosis present

## 2017-07-27 DIAGNOSIS — F17200 Nicotine dependence, unspecified, uncomplicated: Secondary | ICD-10-CM | POA: Diagnosis present

## 2017-07-27 DIAGNOSIS — Y831 Surgical operation with implant of artificial internal device as the cause of abnormal reaction of the patient, or of later complication, without mention of misadventure at the time of the procedure: Secondary | ICD-10-CM | POA: Diagnosis present

## 2017-07-27 DIAGNOSIS — D696 Thrombocytopenia, unspecified: Secondary | ICD-10-CM | POA: Diagnosis present

## 2017-07-27 DIAGNOSIS — T82594A Other mechanical complication of infusion catheter, initial encounter: Secondary | ICD-10-CM | POA: Diagnosis not present

## 2017-07-27 DIAGNOSIS — I330 Acute and subacute infective endocarditis: Secondary | ICD-10-CM | POA: Diagnosis present

## 2017-07-27 DIAGNOSIS — Z9114 Patient's other noncompliance with medication regimen: Secondary | ICD-10-CM | POA: Diagnosis not present

## 2017-07-27 DIAGNOSIS — R791 Abnormal coagulation profile: Secondary | ICD-10-CM | POA: Diagnosis present

## 2017-07-27 DIAGNOSIS — R197 Diarrhea, unspecified: Secondary | ICD-10-CM | POA: Diagnosis not present

## 2017-07-27 DIAGNOSIS — G894 Chronic pain syndrome: Secondary | ICD-10-CM | POA: Diagnosis present

## 2017-07-27 DIAGNOSIS — R451 Restlessness and agitation: Secondary | ICD-10-CM | POA: Diagnosis present

## 2017-07-27 DIAGNOSIS — E871 Hypo-osmolality and hyponatremia: Secondary | ICD-10-CM | POA: Diagnosis present

## 2017-07-27 DIAGNOSIS — F1910 Other psychoactive substance abuse, uncomplicated: Secondary | ICD-10-CM | POA: Diagnosis present

## 2017-07-27 DIAGNOSIS — B182 Chronic viral hepatitis C: Secondary | ICD-10-CM | POA: Diagnosis present

## 2017-07-27 DIAGNOSIS — Z954 Presence of other heart-valve replacement: Secondary | ICD-10-CM

## 2017-07-27 DIAGNOSIS — F1590 Other stimulant use, unspecified, uncomplicated: Secondary | ICD-10-CM | POA: Diagnosis present

## 2017-07-27 DIAGNOSIS — M79609 Pain in unspecified limb: Secondary | ICD-10-CM | POA: Diagnosis not present

## 2017-07-27 DIAGNOSIS — Z952 Presence of prosthetic heart valve: Secondary | ICD-10-CM | POA: Diagnosis not present

## 2017-07-27 DIAGNOSIS — I2699 Other pulmonary embolism without acute cor pulmonale: Secondary | ICD-10-CM | POA: Diagnosis present

## 2017-07-27 DIAGNOSIS — I339 Acute and subacute endocarditis, unspecified: Secondary | ICD-10-CM

## 2017-07-27 DIAGNOSIS — B9561 Methicillin susceptible Staphylococcus aureus infection as the cause of diseases classified elsewhere: Secondary | ICD-10-CM | POA: Diagnosis not present

## 2017-07-27 DIAGNOSIS — I38 Endocarditis, valve unspecified: Secondary | ICD-10-CM

## 2017-07-27 DIAGNOSIS — I5022 Chronic systolic (congestive) heart failure: Secondary | ICD-10-CM | POA: Diagnosis present

## 2017-07-27 DIAGNOSIS — I351 Nonrheumatic aortic (valve) insufficiency: Secondary | ICD-10-CM | POA: Diagnosis not present

## 2017-07-27 DIAGNOSIS — Y848 Other medical procedures as the cause of abnormal reaction of the patient, or of later complication, without mention of misadventure at the time of the procedure: Secondary | ICD-10-CM | POA: Diagnosis present

## 2017-07-27 LAB — CBC WITH DIFFERENTIAL/PLATELET
BASOS ABS: 0 10*3/uL (ref 0.0–0.1)
Basophils Absolute: 0.1 K/uL (ref 0.0–0.1)
Basophils Relative: 0 %
Basophils Relative: 1 %
EOS ABS: 0.1 10*3/uL (ref 0.0–0.7)
Eosinophils Absolute: 0.1 K/uL (ref 0.0–0.7)
Eosinophils Relative: 1 %
Eosinophils Relative: 1 %
HCT: 29.8 % — ABNORMAL LOW (ref 39.0–52.0)
HEMATOCRIT: 31.2 % — AB (ref 39.0–52.0)
HEMOGLOBIN: 10.9 g/dL — AB (ref 13.0–17.0)
Hemoglobin: 10.3 g/dL — ABNORMAL LOW (ref 13.0–17.0)
Lymphocytes Relative: 21 %
Lymphocytes Relative: 16 %
Lymphs Abs: 1.5 10*3/uL (ref 0.7–4.0)
Lymphs Abs: 1.5 K/uL (ref 0.7–4.0)
MCH: 27.1 pg (ref 26.0–34.0)
MCH: 26.9 pg (ref 26.0–34.0)
MCHC: 34.9 g/dL (ref 30.0–36.0)
MCHC: 34.6 g/dL (ref 30.0–36.0)
MCV: 77.6 fL — ABNORMAL LOW (ref 78.0–100.0)
MCV: 77.8 fL — ABNORMAL LOW (ref 78.0–100.0)
Monocytes Absolute: 1.6 10*3/uL — ABNORMAL HIGH (ref 0.1–1.0)
Monocytes Absolute: 2 K/uL — ABNORMAL HIGH (ref 0.1–1.0)
Monocytes Relative: 23 %
Monocytes Relative: 22 %
NEUTROS ABS: 4 10*3/uL (ref 1.7–7.7)
NEUTROS PCT: 55 %
Neutro Abs: 5.4 K/uL (ref 1.7–7.7)
Neutrophils Relative %: 60 %
Platelets: 107 10*3/uL — ABNORMAL LOW (ref 150–400)
Platelets: 123 K/uL — ABNORMAL LOW (ref 150–400)
RBC: 4.02 MIL/uL — ABNORMAL LOW (ref 4.22–5.81)
RBC: 3.83 MIL/uL — ABNORMAL LOW (ref 4.22–5.81)
RDW: 15 % (ref 11.5–15.5)
RDW: 15 % (ref 11.5–15.5)
WBC: 7.3 10*3/uL (ref 4.0–10.5)
WBC: 9.1 K/uL (ref 4.0–10.5)

## 2017-07-27 LAB — PROTIME-INR
INR: 3.13
INR: 2.89
PROTHROMBIN TIME: 30.9 s — AB (ref 11.4–15.2)
Prothrombin Time: 32.9 seconds — ABNORMAL HIGH (ref 11.4–15.2)

## 2017-07-27 LAB — MRSA PCR SCREENING: MRSA by PCR: NEGATIVE

## 2017-07-27 LAB — BASIC METABOLIC PANEL
Anion gap: 7 (ref 5–15)
BUN: 9 mg/dL (ref 6–20)
CHLORIDE: 103 mmol/L (ref 101–111)
CO2: 23 mmol/L (ref 22–32)
CREATININE: 1 mg/dL (ref 0.61–1.24)
Calcium: 7.9 mg/dL — ABNORMAL LOW (ref 8.9–10.3)
GFR calc Af Amer: >60 mL/min (ref >60)
GFR calc non Af Amer: >60 mL/min (ref >60)
GLUCOSE: 114 mg/dL — AB (ref 65–99)
POTASSIUM: 3.7 mmol/L (ref 3.5–5.1)
SODIUM: 133 mmol/L — AB (ref 135–145)

## 2017-07-27 LAB — HIV ANTIBODY (ROUTINE TESTING W REFLEX): HIV Screen 4th Generation wRfx: NONREACTIVE

## 2017-07-27 MED ORDER — KETOROLAC TROMETHAMINE 15 MG/ML IJ SOLN
15.0000 mg | Freq: Four times a day (QID) | INTRAMUSCULAR | Status: DC | PRN
  Administered 2017-07-27 – 2017-07-31 (×11): 15 mg via INTRAVENOUS
  Filled 2017-07-27 (×12): qty 1

## 2017-07-27 MED ORDER — LORAZEPAM 1 MG PO TABS
1.0000 mg | ORAL_TABLET | ORAL | Status: DC | PRN
  Administered 2017-07-27 (×2): 1 mg via ORAL
  Filled 2017-07-27 (×2): qty 1

## 2017-07-27 MED ORDER — VITAMIN B-1 100 MG PO TABS
100.0000 mg | ORAL_TABLET | Freq: Every day | ORAL | Status: DC
  Administered 2017-07-27 – 2017-07-31 (×5): 100 mg via ORAL
  Filled 2017-07-27 (×5): qty 1

## 2017-07-27 MED ORDER — CLONIDINE HCL 0.1 MG PO TABS: 0.1000 mg | ORAL_TABLET | ORAL | Status: DC | PRN

## 2017-07-27 MED ORDER — BISACODYL 5 MG PO TBEC: 10.0000 mg | DELAYED_RELEASE_TABLET | Freq: Every day | ORAL | Status: DC | PRN

## 2017-07-27 MED ORDER — ADULT MULTIVITAMIN W/MINERALS CH
1.0000 | ORAL_TABLET | Freq: Every day | ORAL | Status: DC
  Administered 2017-07-27 – 2017-07-31 (×5): 1 via ORAL
  Filled 2017-07-27 (×6): qty 1

## 2017-07-27 MED ORDER — LORAZEPAM 2 MG/ML IJ SOLN: 2.0000 mg | INTRAMUSCULAR | Status: DC | PRN

## 2017-07-27 MED ORDER — IBUPROFEN 200 MG PO TABS
400.0000 mg | ORAL_TABLET | ORAL | Status: DC | PRN
  Administered 2017-07-29 – 2017-07-31 (×5): 400 mg via ORAL
  Filled 2017-07-27 (×5): qty 2

## 2017-07-27 MED ORDER — RIFAMPIN 300 MG PO CAPS
300.0000 mg | ORAL_CAPSULE | Freq: Three times a day (TID) | ORAL | Status: DC
  Administered 2017-07-27 – 2017-07-31 (×14): 300 mg via ORAL
  Filled 2017-07-27 (×18): qty 1

## 2017-07-27 MED ORDER — IOPAMIDOL (ISOVUE-370) INJECTION 76%
INTRAVENOUS | Status: AC
  Administered 2017-07-27: 100 mL via INTRAVENOUS
  Filled 2017-07-27: qty 100

## 2017-07-27 MED ORDER — NICOTINE 14 MG/24HR TD PT24
14.0000 mg | MEDICATED_PATCH | Freq: Every day | TRANSDERMAL | Status: DC | PRN
  Administered 2017-07-27 – 2017-07-28 (×2): 14 mg via TRANSDERMAL
  Filled 2017-07-27 (×3): qty 1

## 2017-07-27 MED ORDER — ACETAMINOPHEN 325 MG PO TABS
650.0000 mg | ORAL_TABLET | Freq: Three times a day (TID) | ORAL | Status: DC
  Administered 2017-07-27 – 2017-07-31 (×11): 650 mg via ORAL
  Filled 2017-07-27 (×12): qty 2

## 2017-07-27 MED ORDER — GENTAMICIN SULFATE 40 MG/ML IJ SOLN
120.0000 mg | INTRAVENOUS | Status: AC
  Administered 2017-07-27: 120 mg via INTRAVENOUS
  Filled 2017-07-27: qty 3

## 2017-07-27 MED ORDER — FOLIC ACID 1 MG PO TABS
1.0000 mg | ORAL_TABLET | Freq: Every day | ORAL | Status: DC
Start: 2017-07-27 — End: 2017-07-31
  Administered 2017-07-27 – 2017-07-31 (×5): 1 mg via ORAL
  Filled 2017-07-27 (×5): qty 1

## 2017-07-27 MED ORDER — GENTAMICIN SULFATE 40 MG/ML IJ SOLN
120.0000 mg | Freq: Two times a day (BID) | INTRAVENOUS | Status: DC
  Administered 2017-07-27 – 2017-07-28 (×4): 120 mg via INTRAVENOUS
  Filled 2017-07-27 (×5): qty 3

## 2017-07-27 MED ORDER — CHLORDIAZEPOXIDE HCL 5 MG PO CAPS
10.0000 mg | ORAL_CAPSULE | Freq: Three times a day (TID) | ORAL | Status: DC
  Administered 2017-07-27 (×2): 10 mg via ORAL
  Filled 2017-07-27 (×2): qty 2

## 2017-07-27 MED ORDER — PANTOPRAZOLE SODIUM 40 MG PO TBEC
40.0000 mg | DELAYED_RELEASE_TABLET | Freq: Every day | ORAL | Status: DC
  Administered 2017-07-27 – 2017-07-31 (×6): 40 mg via ORAL
  Filled 2017-07-27 (×6): qty 1

## 2017-07-27 MED ORDER — HYDROXYZINE HCL 25 MG PO TABS
25.0000 mg | ORAL_TABLET | Freq: Four times a day (QID) | ORAL | Status: DC | PRN
  Administered 2017-07-27 – 2017-07-28 (×2): 25 mg via ORAL
  Filled 2017-07-27 (×2): qty 1

## 2017-07-27 MED ORDER — OXYCODONE HCL 5 MG PO TABS
5.0000 mg | ORAL_TABLET | ORAL | Status: DC | PRN
  Administered 2017-07-27 (×3): 5 mg via ORAL
  Filled 2017-07-27 (×3): qty 1

## 2017-07-27 NOTE — Consult Note (Signed)
[facility]    Date of Admission:  07/26/2017   Total days of antibiotics at [facility] 1        Day 2 Ceftriaxone        Day 2 Gentamicin        Day 2 Rifampin       Reason for Consult: MSSA Endocarditis    Referring Provider: Dr. 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  He is pcn allergic from childhood and therefore being treated with gentamicin, ceftriaxone, and rifampin for mssa infective endocarditis. The patient does not complain of pain in any joints or bones for possible metastatic spread. The source of the patient's infection is likely to be due to ivdu. -Pending blood culture report from [facility]  -Continue rifampin, gentamicin, and ceftriaxone -Get TEE for better visualization of heart valves -Consult cardiology for evaluation regarding vegetations on valve -The patient currently has a tunneled cath in place that should be removed once peripheral access it obtained. If peripheral access is not able to be obtained then may consider po zyvox and rifampin. -MRI to be done today 8/13 to determine if spread to cns. Can d/c ceftriaxone if negative. -Perform repeat blood cultures to note clearance      [place] Antimicrobial Management Team Staphylococcus aureus bacteremia   Staphylococcus aureus bacteremia (SAB) is associated with a high rate of complications and mortality.  Specific aspects of clinical management are critical to optimizing the outcome of patients with SAB.  Therefore, the [facility] Antimicrobial Management Team [facility]) has initiated an intervention aimed at improving the management of SAB at [facility].  To do so, Infectious Diseases physicians are providing an evidence-based consult for the management of all patients with SAB.     Yes No Comments  Perform follow-up blood  cultures (even if the patient is afebrile) to ensure clearance of bacteremia [x]  []    Remove vascular catheter and obtain follow-up blood cultures after the removal of the catheter [x]  []    Perform echocardiography to evaluate for endocarditis (transthoracic ECHO is 40-50% sensitive, TEE is > 90% sensitive) [x]  []  Please keep in mind, that neither test can definitively EXCLUDE endocarditis, and that should clinical suspicion remain high for endocarditis the patient should then still be treated with an "endocarditis" duration of therapy = 6 weeks  Consult electrophysiologist to evaluate implanted cardiac device (pacemaker, ICD) []  []  Does not have a pacemaker  Ensure source control []  []  Have all abscesses been drained effectively? Have deep seeded infections (septic joints or osteomyelitis) had appropriate surgical debridement?  Investigate for "metastatic" sites of infection []  [x]  Does the patient have ANY symptom or physical exam finding that would suggest a deeper infection (back or neck pain that may be suggestive of vertebral osteomyelitis or epidural abscess, muscle pain that could be a symptom of pyomyositis)?  Keep in mind that for deep seeded infections MRI imaging with contrast is preferred rather than other often insensitive tests such as plain x-rays, especially early in a patient's presentation.  Change antibiotic therapy to __________________ []  []  Beta-lactam antibiotics are preferred for MSSA due to higher cure rates.   If on Vancomycin, goal trough should be 15 - 20 mcg/mL  Estimated duration of IV antibiotic therapy:   []  []  Consult case management for probably prolonged outpatient IV antibiotic therapy     .  chlordiazePOXIDE  10 mg Oral TID  . pantoprazole  40 mg Oral Daily  . rifampin  300 mg Oral TID    HPI: Norman Reed is a 31 y.o. male with pmh of substance abuse (percocet, crystal meth), chronic systolic heart failure, mitral and tricuspid valve repair in 2016, history  of hepatitis c, and history of cardiac arrest who presented from [facility] with complaints of fever/chills, myalgia, fatigue, and chest pain. The chest pain was intermittent in nature and present for 2-3 days prior to him going to [facility]. While at [facility] the patient was treated for mssa bacteremia with cefazolin, gentamicin, and oral rifampin due to penicillin allerty. Cardiac echo was done and he was found to have an ef=25-30%, thickened mechanical mitral and tricuspid bioprosthetic valves with a 14 x6 mm vegetation on the mitral valve. The patient has not been compliant with his carvedilol and warfarin medication.   The patient stated that he has some sob today 07/27/17, but denied any chest pain or myalgias.   Review of Systems: ROS  as above  Past Medical History:  Diagnosis Date  . Anemia   . Chronic systolic heart failure (HCC)   . Hepatitis C   . History of cardiac arrest   . IV drug abuse     Social History  Substance Use Topics  . Smoking status: Current Every Day Smoker    Packs/day: 0.50  . Smokeless tobacco: Never Used  . Alcohol use No    Family History  Problem Relation Age of Onset  . Hyperlipidemia Maternal Grandmother   . Hyperlipidemia Paternal Grandfather    Allergies  Allergen Reactions  . Penicillins Other (See Comments)    Childhood reaction    OBJECTIVE: Blood pressure (!) 115/41, pulse 100, temperature 98.7 F (37.1 C), temperature source Oral, resp. rate (!) 28, SpO2 98 %.  Physical Exam  Constitutional: He appears lethargic. He appears not jaundiced. He has a sickly appearance.  HENT:  Head: Normocephalic and atraumatic.  Cardiovascular: Normal rate, regular rhythm, normal heart sounds and intact distal pulses.   Pulmonary/Chest: Effort normal and breath sounds normal. No respiratory distress. He has no wheezes.  Abdominal: Soft. Bowel sounds are normal. There is tenderness (generalized).  Neurological: He appears lethargic.    Skin: No rash noted. No erythema.  Psychiatric: Mood, memory, affect and judgment normal.    Lab Results Lab Results  Component Value Date   WBC 7.3 07/26/2017   HGB 10.9 (L) 07/26/2017   HCT 31.2 (L) 07/26/2017   MCV 77.6 (L) 07/26/2017   PLT 107 (L) 07/26/2017    Lab Results  Component Value Date   CREATININE 0.93 07/26/2017   BUN 9 07/26/2017   NA 134 (L) 07/26/2017   K 3.6 07/26/2017   CL 102 07/26/2017   CO2 24 07/26/2017    Lab Results  Component Value Date   ALT 12 (L) 07/26/2017   AST 21 07/26/2017   ALKPHOS 105 07/26/2017   BILITOT 2.7 (H) 07/26/2017     Microbiology: Recent Results (from the past 240 hour(s))  MRSA PCR Screening     Status: None   Collection Time: 07/26/17  9:51 PM  Result Value Ref Range Status   MRSA by PCR NEGATIVE NEGATIVE Final    Comment:        The GeneXpert MRSA Assay (FDA approved for NASAL specimens only), is one component of a comprehensive MRSA colonization surveillance program. It is not intended to diagnose MRSA infection nor  to guide or monitor treatment for MRSA infections.     Lorenso Courier, MD [facility] [phone] pager   [phone] cell 07/27/2017, 8:10 AM

## 2017-07-27 NOTE — Progress Notes (Signed)
Patient threatening to leave if not given morphine for pain. Paged MD and will continue to monitor.

## 2017-07-27 NOTE — Progress Notes (Addendum)
PROGRESS NOTE  Norman Reed  ZOX:[id number] DOB: [birth year] DOA: 07/26/2017 PCP: No primary care provider on file.  Brief Narrative:   Norman Reed is a 31 y.o. 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 Lamonte Sakai, history of IVDA and polysubstance abuse who was admitted on 07/22/2017 to [facility] with complaints of fever, chills, malaise, myalgias, emesis and intermittent chest pain for 2-3 days. Prior to that, he was not taking his carvedilol and warfarin for several weeks. He has been using IV heroin and methamphetamines again.  He last used 2 days prior to arrival at [place].    At [facility], he was treated for the past few days with cefazolin, gentamicin and oral rifampin for and MSSA bacteremia after discussion with infectious diseases. Arrangements for transfer to [facility] were made, but unable to be done due to very tight availability of beds. Echocardiogram showed an EF of 25-30%, a septal bounce, a thickened mitral valve with 14 x 6 mm vegetation but no evidence of regurgitation. The tricuspid valve appeared to have some thickening as well with trace TR.  He was subsequently transferred to this facility for higher level of care, formal consultation with infectious diseases and consultation with cardiothoracic surgery.    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 -  Continue gentamicin per pharmacy. -  Continue ceftriaxone 2 g IVPB every 12 hours.  -  Will follow up with [facility] regarding blood cultures 8/10:  1 prelim no growth 8/9 2 sets:  1 Prelim no growth, 1 MSSA 8/8 2/2 sets:  MSSA  -  CT surgery consulted  -  CT surgery recommending TEE and repeat CT angio chest  Active Problems:   Chronic systolic heart failure (HCC) Secondary to valvular heart disease and multiple endocarditis episodes. Echocardiogram at [facility] show severe global hypokinesis of the left ventricle with an EF of 25-30%. There was a septal bounce present, a vegetation of the mitral valve and thickened tricuspid valve.  -  D/c IVF    Anemia, hemoglobin approximately stable near 10g/dl Monitor hematocrit and hemoglobin.    Hypomagnesemia Replacement ordered. Follow up magnesium level as needed.    Hyponatremia Improved. - d/c IVF Monitor sodium level, intake and output.     History of mitral valve replacement   History of tricuspid valve replacement Poor surgical candidate given his persistent history of IVDA.    Thrombocytopenia (HCC) There was suspicion for sepsis related thrombocytopenia. Low suspicion for heparin-induced thrombocytopenia.  -  antibodies were ordered at [facility] and were 0.188 negative. -  Dc warfarin    Polysubstance abuse, with increasing agitation -  shows signs of opioid withdrawal at [facility]. -  COWS with clonidine prn -  Start CIWA with q1h prn ativan -  D/c  librium Nicotine replacement therapy was ordered.  DVT prophylaxis:  D/c warfarin Code Status:  Full code Family Communication:  Patient alone Disposition Plan:  Awaiting ID and CT surgery consultations. He will need placement for IV antibiotics due to ongoing IVDA.  Spoke with [facility] transfer center again on  07/27/2017.  The patient's case was reviewed including ECHO results by [facility]'s Drs. Heney and Pickett who jointly declined the patient's transfer stating that he did not need urgent surgery and should be treated as an outpatient with IV antibiotics with follow up in clinic.    Consultants:   ID consult  CT surgery  Procedures:  none  Antimicrobials:  Anti-infectives    Start     Dose/Rate Route Frequency Ordered Stop   07/27/17 1200  gentamicin (GARAMYCIN) 120 mg in dextrose 5 % 50 mL IVPB     120 mg 106 mL/hr over 30 Minutes Intravenous Every 12 hours 07/27/17 0001     07/27/17 0015  gentamicin (GARAMYCIN) 120 mg in dextrose 5 % 50 mL IVPB     120 mg 106 mL/hr over 30 Minutes Intravenous STAT 07/27/17 0001 07/27/17 0141   07/27/17 0015  rifampin (RIFADIN) capsule 300 mg     300 mg Oral 3 times daily 07/27/17 0009     07/26/17 2315  cefTRIAXone (ROCEPHIN) 2 g in dextrose 5 % 50 mL IVPB    Comments:  He was on cefazolin at [facility].   2 g 100 mL/hr over 30 Minutes Intravenous Every 12 hours 07/26/17 2301         Subjective:  Denies SOB, chest pain.  Having some nausea and diarrhea and anxiety.  Last used 6-7 days ago.    Objective: Vitals:   07/26/17 2130 07/26/17 2342 07/27/17 0338 07/27/17 0741  BP: (!) 100/46 (!) 105/52 107/60 (!) 115/41  Pulse: 94 98 93 100  Resp: (!) 28 (!) 26 (!) 26 (!) 28  Temp: 100 F (37.8 C) 98.7 F (37.1 C) 98.4 F (36.9 C) 98.7 F (37.1 C)  TempSrc: Oral Oral Oral Oral  SpO2: 100% 100% 97% 98%    Intake/Output Summary (Last 24 hours) at 07/27/17 0817 Last data filed at 07/27/17 0600  Gross per 24 hour  Intake              720 ml  Output              400 ml  Net              320 ml   There were no vitals filed for this visit.  Examination:  General exam:  Adult male.  No acute distress.  HEENT:  NCAT, MMM Respiratory system:  Rales at the bilateral bases, no rhonchi or wheeze Cardiovascular system: Regular rate and  rhythm, systolic and diastolic murmurs present with a gallop.   Gastrointestinal system: Normal active bowel sounds, soft, nondistended, nontender. MSK:  Normal tone and bulk, 1+ lower extremity edema Neuro:  Grossly intact  Derm:  Track marks on bilateral arms.  Was difficult IV access and has RIGHT IJ in place, placed in ER at [facility] on date of admission    Data Reviewed: I have personally reviewed following labs and imaging studies  CBC:  Recent Labs Lab 07/26/17 2235 07/27/17 0752  WBC 7.3 9.1  NEUTROABS 4.0 PENDING  HGB 10.9* 10.3*  HCT 31.2* 29.8*  MCV 77.6* 77.8*  PLT 107* 123*   Basic Metabolic Panel:  Recent Labs Lab 07/26/17 2235  NA 134*  K 3.6  CL 102  CO2 24  GLUCOSE 114*  BUN 9  CREATININE 0.93  CALCIUM 8.1*  MG 1.5*   GFR: CrCl cannot be calculated (Unknown ideal weight.). Liver Function Tests:  Recent Labs Lab 07/26/17 2235  AST 21  ALT 12*  ALKPHOS 105  BILITOT 2.7*  PROT 6.2*  ALBUMIN 2.9*   No results for input(s): LIPASE, AMYLASE in the last 168 hours. No results for input(s): AMMONIA in the last 168 hours. Coagulation Profile:  Recent Labs Lab 07/26/17 2213  INR 3.80   Sepsis Labs: @LABRCNTIP (procalcitonin:4,lacticidven:4)  ) Recent Results (from the past 240 hour(s))  MRSA PCR Screening     Status: None   Collection Time: 07/26/17  9:51 PM  Result Value Ref Range Status   MRSA by PCR NEGATIVE NEGATIVE Final    Comment:        The GeneXpert MRSA Assay (FDA approved for NASAL specimens only), is one component of a comprehensive MRSA colonization surveillance program. It is not intended to diagnose MRSA infection nor to guide or monitor treatment for MRSA infections.       Radiology Studies: No results found.   Scheduled Meds: . chlordiazePOXIDE  10 mg Oral TID  . pantoprazole  40 mg Oral Daily  . rifampin  300 mg Oral TID   Continuous Infusions: . 0.9 % NaCl with KCl 20 mEq / L 1,000 mL (07/27/17 0319)   . cefTRIAXone (ROCEPHIN)  IV Stopped (07/27/17 0027)  . gentamicin       LOS: 1 day    Time spent: 30 min    Renae Fickle, MD [facility] Pager [phone]  If 7PM-7AM, please contact night-coverage [email or web address] Password [facility] 07/27/2017, 8:17 AM

## 2017-07-27 NOTE — Consult Note (Signed)
[address]       [place] [address]             [phone]        Norman Reed [facility] Medical Record #[id number] Date of Birth: 04/04/1986  Referring: Renae Fickle Primary Care: No primary care provider on file.  Chief Complaint:  Endocarditis  History of Present Illness:      Norman Reed is a 31 yo white male with known history of IV Drug abuse, anemia, Chronic Systolic HF, Hepatitis C, and S/P Mitral Valve (tissue )  and Tricuspid Valve replacement and placement of permanent epicardial leads performed at [facility] in 2016 bu Dr. Lamonte Sakai.  The patient presented to [facility] on 07/22/2017 with complaints of fever, chills, myalgias, nausea, vomiting and intermittent chest pain.  These symptoms had been present for 2-3 days at time of presentation.  Workup during that hospitalization found the patient to have MSSA bacteremia.  He was started on Cefazolin, gentamicin and oral rifampin.  He underwent CT scan which showed Pulmonary embolism to the segmental and subsegmental pulmonary arteries of the right lower lobe, small right pleural effusion, and a right upper lobe subpleural nodular density.   He also underwent Transthoracic echocardiogram which showed severe global hypokinesis of LV, significantly reduced EF of 25-30%, there was no mitral regurgitation, there was a 14mm x 6 mm vegetation on a thickened mitral valve, mild TV thickening, and mild aortic eccentric regurgitation and vegetation could not be ruled out.  Transfer to [facility] was arranged and he was accepted by Dr. Lamonte Sakai, however upon attempted transfer, they were told they would not be able to accept the patient.  He was transferred to [facility] hospital for a higher level of care. Since primary care team discussed with Dr Ambrose Mantle who did not think needed urgent surgery    Patient is a poor participant in evaluation today.  He was in and out of sleep at times and would not answer all questions.  He did admit that  he has been an IV drug abuser off and on for the past 8 years.  He states his first heart surgery performed in 2016 was due to Endocarditis from IV drug abuse.  He continues to abuse IV drugs currently, admitting to using Methamphetamines and Subaxone.  He last used 2 days prior to admission and his toxicology screen was positive on admission.  At [facility], he states his suffered his cardiac arrest after surgery, after building up too much fluid.  It appears he was taken back to OR for re-exploration and sub xiphoid window at that time.  He states he remained clean about 6 months after that hospital discharge, but has used regularly off and on since.  He stopped taking his coumadin and coreg weeks ago.    Current Activity/ Functional Status: Patient is independent with mobility/ambulation, transfers, ADL's, IADL's.   Zubrod Score: At the time of surgery this patient's most appropriate activity status/level should be described as: []     0    Normal activity, no symptoms [x]     1    Restricted in physical strenuous activity but ambulatory, able to do out light work []     2    Ambulatory and capable of self care, unable to do work activities, up and about                 more than 50%  Of the time                            []   3    Only limited self care, in bed greater than 50% of waking hours []     4    Completely disabled, no self care, confined to bed or chair []     5    Moribund  Past Medical History:  Diagnosis Date  . Anemia   . Chronic systolic heart failure (HCC)   . Hepatitis C   . History of cardiac arrest   . IV drug abuse     Past Surgical History:  Procedure Laterality Date  . MITRAL VALVE REPAIR    . TRICUSPID VALVE REPLACEMENT      History  Smoking Status  . Current Every Day Smoker  . Packs/day: 0.50  Smokeless Tobacco  . Never Used    History  Alcohol Use No    Social History   Social History  . Marital status: Single    Spouse name: N/A  . Number of  children: N/A  . Years of education: N/A   Occupational History  . Not on file.   Social History Main Topics  . Smoking status: Current Every Day Smoker    Packs/day: 0.50  . Smokeless tobacco: Never Used  . Alcohol use No  . Drug use: Yes    Types: Methamphetamines, Marijuana  . Sexual activity: Yes   Other Topics Concern  . Not on file   Social History Narrative  . No narrative on file    Allergies  Allergen Reactions  . Penicillins Other (See Comments)    Childhood reaction    Current Facility-Administered Medications  Medication Dose Route Frequency Provider Last Rate Last Dose  . acetaminophen (TYLENOL) tablet 650 mg  650 mg Oral Q6H PRN Bobette Mo, MD   650 mg at 07/27/17 0603  . bisacodyl (DULCOLAX) EC tablet 10 mg  10 mg Oral Daily PRN Bobette Mo, MD      . cefTRIAXone (ROCEPHIN) 2 g in dextrose 5 % 50 mL IVPB  2 g Intravenous Q12H Bobette Mo, MD   Stopped at 07/27/17 [phone]  . chlordiazePOXIDE (LIBRIUM) capsule 10 mg  10 mg Oral TID Bobette Mo, MD      . gentamicin (GARAMYCIN) 120 mg in dextrose 5 % 50 mL IVPB  120 mg Intravenous Q12H Juliette Mangle, RPH      . hydrOXYzine (ATARAX/VISTARIL) tablet 25 mg  25 mg Oral Q6H PRN Bobette Mo, MD      . LORazepam (ATIVAN) tablet 1 mg  1 mg Oral Q4H PRN Bobette Mo, MD      . nicotine (NICODERM CQ - dosed in mg/24 hours) patch 14 mg  14 mg Transdermal Daily PRN Bobette Mo, MD   14 mg at 07/27/17 [phone]  . oxyCODONE (Oxy IR/ROXICODONE) immediate release tablet 5 mg  5 mg Oral Q4H PRN Bobette Mo, MD   5 mg at 07/27/17 5409  . pantoprazole (PROTONIX) EC tablet 40 mg  40 mg Oral Daily Bobette Mo, MD   40 mg at 07/27/17 0102  . prochlorperazine (COMPAZINE) injection 10 mg  10 mg Intravenous Q6H PRN Bobette Mo, MD      . rifampin (RIFADIN) capsule 300 mg  300 mg Oral TID Bobette Mo, MD   300 mg at 07/27/17 8119    Prescriptions Prior to  Admission  Medication Sig Dispense Refill Last Dose  . acetaminophen (TYLENOL) 325 MG tablet Take 650 mg by mouth every 6 (six) hours as needed for  mild pain or fever.     . bisacodyl (DULCOLAX) 5 MG EC tablet Take 10 mg by mouth daily as needed for moderate constipation.     Marland Kitchen ceFAZolin (ANCEF) IVPB Inject 2 g into the vein every 8 (eight) hours.     . chlordiazePOXIDE (LIBRIUM) 10 MG capsule Take 10 mg by mouth 3 (three) times daily.     Marland Kitchen gentamicin (GARAMYCIN) IVPB Inject 80 mg into the vein every 12 (twelve) hours.     . hydrOXYzine (ATARAX/VISTARIL) 25 MG tablet Take 25 mg by mouth every 6 (six) hours as needed for anxiety.     Marland Kitchen LORazepam (ATIVAN) 1 MG tablet Take 1 mg by mouth every 4 (four) hours as needed for anxiety.     Marland Kitchen oxycodone (OXY-IR) 5 MG capsule Take 5 mg by mouth every 4 (four) hours as needed for pain.     . rifampin (RIFADIN) 300 MG capsule Take 300 mg by mouth 3 (three) times daily.     Marland Kitchen warfarin (COUMADIN) 5 MG tablet Take 5 mg by mouth daily.       Family History  Problem Relation Age of Onset  . Hyperlipidemia Maternal Grandmother   . Hyperlipidemia Paternal Grandfather    Review of Systems:  Pertinent items are noted in HPI.     Cardiac Review of Systems: Y or N  Chest Pain [  y  ]  Resting SOB Cove.Etienne   ] Exertional SOB  [ y ]  Orthopnea [  ]   Pedal Edema [ y  ]    Palpitations [  ] Syncope  [  ]   Presyncope [   ]  General Review of Systems: [Y] = yes [  ]=no Constitional: recent weight change [  ]; anorexia [  ]; fatigue [  ]; nausea [ y ]; night sweats [  ]; fever Cove.Etienne  ]; or chills Cove.Etienne  ]                                                               Dental: poor dentition[ y ]; Last Dentist visit:   Eye : blurred vision [  ]; diplopia [   ]; vision changes [  ];  Amaurosis fugax[  ]; Resp: cough Cove.Etienne  ];  wheezing[  ];  hemoptysis[  ]; shortness of breath[ y ]; paroxysmal nocturnal dyspnea[  ]; dyspnea on exertion[  y]; or orthopnea[  ];  GI:  gallstones[  ],  vomiting[y  ];  dysphagia[  ]; melena[  ];  hematochezia [  ]; heartburn[  ];   Hx of  Colonoscopy[  ]; GU: kidney stones [  ]; hematuria[  ];   dysuria [  ];  nocturia[  ];  history of     obstruction [  ]; urinary frequency [  ]             Skin: rash, swelling[  ];, hair loss[  ];  peripheral edema[ y ];  or itching[  ]; Musculosketetal: myalgias[  ];  joint swelling[  ];  joint erythema[  ];  joint pain[ y ];  back pain[  ];  Heme/Lymph: bruising[  ];  bleeding[  ];  anemia[  ];  Neuro: TIA[  ];  headaches[  ];  stroke[  ];  vertigo[  ];  seizures[  ];   paresthesias[  ];  difficulty walking[  ];  Psych:depression[  ]; anxiety[  ];  Endocrine: diabetes[  ];  thyroid dysfunction[  ];  Immunizations: Flu [  ]; Pneumococcal[  ];  Other:  Physical Exam: BP (!) 115/41 (BP Location: Left Arm)   Pulse 100   Temp 98.7 F (37.1 C) (Oral)   Resp (!) 28   SpO2 98%   General appearance: alert, uncooperative and appears ill Head: Normocephalic, without obvious abnormality, atraumatic Neck: no adenopathy, no carotid bruit, no JVD, supple, symmetrical, trachea midline and thyroid not enlarged, symmetric, no tenderness/mass/nodules Resp: clear to auscultation bilaterally Cardio: regular rate and rhythm and diminshed valve click heard, no murmurs appreciated GI: soft, non-tender; bowel sounds normal; no masses,  no organomegaly Extremities: edema mild bilaterally, painful to palpation over lower extremities  Neurologic: Grossly normal  Diagnostic Studies & Laboratory data:     Recent Radiology Findings:    CLINICAL DATA: Chest pain and dyspnea with fever times 24 hours. Intravenous drug use.  EXAM: CT ANGIOGRAPHY CHEST WITH CONTRAST  TECHNIQUE: Multidetector CT imaging of the chest was performed using the standard protocol during bolus administration of intravenous contrast. Multiplanar CT image reconstructions and MIPs were obtained to evaluate the vascular anatomy.  CONTRAST: 70 cc  Isovue 370 IV  COMPARISON: 01/24/2016 CT  FINDINGS: Cardiovascular: The segmental and subsegmental pulmonary arteries to the right lower lobe appear occluded consistent with pulmonary emboli. Dilatation of the main pulmonary artery to 3.5 cm consistent pulmonary hypertension. No evidence of right heart strain. IJ central line catheter terminates in the distal SVC. Heart is enlarged with evidence of prior tricuspid and mitral valvular replacements. No pericardial effusion. Median sternotomy sutures are noted. The thoracic aorta is not aneurysmal. No aortic dissection.  Mediastinum/Nodes: 12 mm short axis right lower paratracheal and 2 cm short axis subcarinal lymphadenopathy possibly reactive in etiology are identified. Smaller prevascular lymph nodes are present the largest approximately 1 cm short axis. Small bilateral hilar lymph nodes are present. These appear stable to slightly smaller than on the 2017 exam. Trachea mainstem bronchi are patent. No acute esophageal abnormality. No thyromegaly.  Lungs/Pleura: 5 mm nodular subpleural density in the right upper lobe possibly representing some residual scarring since prior exam as it appears much smaller than on prior. Small right effusion with right lower lobe bronchiectasis, atelectasis and/or scarring. Left lung is relatively clear apart from some left basilar atelectasis or scarring.  Upper Abdomen: No acute abnormality.  Musculoskeletal: No chest wall abnormality. No acute or significant osseous findings.  Review of the MIP images confirms the above findings.  IMPRESSION: 1. Unopacified segmental and subsegmental pulmonary arteries to the right lower lobe consistent with pulmonary emboli. No right heart strain. Critical Value/emergent results were called by telephone at the time of interpretation on 07/22/2017 at 8:10 pm to Dr. Janeece Fitting DO, who verbally acknowledged these results. 2. Status post mitral and tricuspid  valve replacement. 3. Small right effusion with atelectasis and bronchiectasis. 4. Stable to smaller mediastinal and hilar adenopathy possibly reactive. 5. 5 mm right upper lobe subpleural nodular density may reflect a small residual focus of scarring and postinflammatory change since prior. Small pulmonary nodule is not excluded. No follow-up needed if patient is low-risk. Non-contrast chest CT can be considered in 12 months if patient is high-risk. This recommendation follows the consensus statement: Guidelines for Management of Incidental Pulmonary Nodules Detected on CT Images:  From the Fleischner Society 2017; Radiology 2017; [phone].   Electronically Signed By: Tollie Eth M.D. On: 07/22/2017 20:10  Patient has xray finding or tissue mitral rep[lacewment , and placement of permenent epicardial leads , the xray of tricuspid replacement is not evident  I have independently reviewed the above radiologic studies.  Recent Lab Findings: Lab Results  Component Value Date   WBC 9.1 07/27/2017   HGB 10.3 (L) 07/27/2017   HCT 29.8 (L) 07/27/2017   PLT 123 (L) 07/27/2017   GLUCOSE 114 (H) 07/27/2017   ALT 12 (L) 07/26/2017   AST 21 07/26/2017   NA 133 (L) 07/27/2017   K 3.7 07/27/2017   CL 103 07/27/2017   CREATININE 1.00 07/27/2017   BUN 9 07/27/2017   CO2 23 07/27/2017   INR 3.13 07/27/2017   Assessment / Plan:      1. Mitral Valve endocarditis- reduced EF, no mitral insufficiency---- patient is S/P Mitral Valve Replacement with tissue  MVR, Tricuspid valve repair---- stopped taking coumadin, coreg, several weeks ago 2. Right side Pulmonary embolism- not on anticoagulation.. Will defer to primary service, but appears more segment clot rather then septic embloi  3. ID- following, patient is afebrile, remains on broad spectrum ABX coverage 4. Joint pain- likely has septic emboli 5. IV Drug abuse- ongoing over past 8 years, current use 2 days prior to admission 6. Dispo-  Consider repeat ct a of chest , will need to decide if needs anticoagulation, consider TEE , But would treat patient with IV anabiotics for endocarditis , with ongoing iv drug use would not re operate on the patient. Follow up at [facility] outpatient clinic   To make record clear  Patient has tissue mitral valve replacement no mechanical based on review of chest xray   I  spent45 minutes counseling the patient face to face and 50% or more the  time was spent in counseling and coordination of care. The total time spent in the appointment was 60 minutes.  Delight Ovens MD      [address] [place] [place] [address] Office [phone]   Beeper [phone]

## 2017-07-27 NOTE — Progress Notes (Signed)
Pt. exhibiting signs of withdrawal including diaphoresis, anxiety, agitation, generalized pain, tremors, and tachycardia. Will continue to monitor.

## 2017-07-27 NOTE — Progress Notes (Addendum)
ANTICOAGULATION CONSULT NOTE - Initial Consult  Pharmacy Consult for warfarin  Indication: mechanical MVR/TVR  Vital Signs: Temp: 98.4 F (36.9 C) (08/13 0338) Temp Source: Oral (08/13 0338) BP: 107/60 (08/13 0338) Pulse Rate: 93 (08/13 0338)  Labs:  Recent Labs  07/26/17 2213 07/26/17 2235  HGB  --  10.9*  HCT  --  31.2*  PLT  --  107*  LABPROT 38.4*  --   INR 3.80  --   CREATININE  --  0.93    CrCl cannot be calculated (Unknown ideal weight.).   Medical History: Past Medical History:  Diagnosis Date  . Anemia   . Chronic systolic heart failure (HCC)   . Hepatitis C   . History of cardiac arrest   . IV drug abuse    Assessment: 31 yo male admitted with fever, chills, malaise, and chest pain. Pharmacy consulted to continue PTA warfarin for history of mechanical valve. Patient transferred from [facility], with last dose of warfarin (10 mg) documented on 8/11. Per OSH records, patient also has recent CT indicating possible PE vs. septic embolus.   INR at OSH was 1.8 on 8/11 and 5.9 on 8/12. Of note, PO rifampin was recently started. Records also report patient was previously on heparin bridge until INR >2, which was changed to Arixtra for drop in platelets. Heparin dose was held on 8/12 due to increase in INR.   INR today remains supratherapeutic at 3.8. Hgb is slightly low at 10.9 and platelets low at 107. No overt s/s bleeding noted.    PTA dose per OSH from [place]: 8 mg daily   Goal of Therapy:  INR 2-3 per paper records (need to confirm INR goal)  Monitor platelets by anticoagulation protocol: Yes   Plan:  No warfarin dose at this time  Repeat INR this afternoon Monitor daily INR and CBC as needed Watch for s/s bleeding   York Cerise, PharmD Clinical Pharmacist 07/27/17 5:47 AM

## 2017-07-28 ENCOUNTER — Inpatient Hospital Stay (HOSPITAL_COMMUNITY): Payer: BLUE CROSS/BLUE SHIELD

## 2017-07-28 DIAGNOSIS — A4101 Sepsis due to Methicillin susceptible Staphylococcus aureus: Secondary | ICD-10-CM

## 2017-07-28 LAB — BASIC METABOLIC PANEL
ANION GAP: 7 (ref 5–15)
BUN: 9 mg/dL (ref 6–20)
CALCIUM: 8.1 mg/dL — AB (ref 8.9–10.3)
CHLORIDE: 102 mmol/L (ref 101–111)
CO2: 25 mmol/L (ref 22–32)
Creatinine, Ser: 0.94 mg/dL (ref 0.61–1.24)
GFR calc non Af Amer: >60 mL/min (ref >60)
GLUCOSE: 110 mg/dL — AB (ref 65–99)
Potassium: 3.8 mmol/L (ref 3.5–5.1)
Sodium: 134 mmol/L — ABNORMAL LOW (ref 135–145)

## 2017-07-28 LAB — CBC WITH DIFFERENTIAL/PLATELET
BASOS PCT: 1 %
Basophils Absolute: 0.1 10*3/uL (ref 0.0–0.1)
EOS ABS: 0.1 10*3/uL (ref 0.0–0.7)
EOS PCT: 1 %
HEMATOCRIT: 30.2 % — AB (ref 39.0–52.0)
HEMOGLOBIN: 10.2 g/dL — AB (ref 13.0–17.0)
LYMPHS PCT: 20 %
Lymphs Abs: 1.7 10*3/uL (ref 0.7–4.0)
MCH: 26.5 pg (ref 26.0–34.0)
MCHC: 33.8 g/dL (ref 30.0–36.0)
MCV: 78.4 fL (ref 78.0–100.0)
MONOS PCT: 16 %
Monocytes Absolute: 1.3 10*3/uL — ABNORMAL HIGH (ref 0.1–1.0)
NEUTROS ABS: 5.2 10*3/uL (ref 1.7–7.7)
NEUTROS PCT: 62 %
PLATELETS: 180 10*3/uL (ref 150–400)
RBC: 3.85 MIL/uL — ABNORMAL LOW (ref 4.22–5.81)
RDW: 15.4 % (ref 11.5–15.5)
WBC: 8.4 10*3/uL (ref 4.0–10.5)

## 2017-07-28 LAB — PROTIME-INR
INR: 2.07
Prothrombin Time: 23.6 seconds — ABNORMAL HIGH (ref 11.4–15.2)

## 2017-07-28 LAB — PATHOLOGIST SMEAR REVIEW

## 2017-07-28 MED ORDER — IOPAMIDOL (ISOVUE-300) INJECTION 61%
INTRAVENOUS | Status: AC
  Administered 2017-07-28: 75 mL
  Filled 2017-07-28: qty 100

## 2017-07-28 MED ORDER — SODIUM CHLORIDE 0.9% FLUSH: 10.0000 mL | INTRAVENOUS | Status: DC | PRN

## 2017-07-28 MED ORDER — BUPRENORPHINE HCL-NALOXONE HCL 8-2 MG SL SUBL
1.0000 | SUBLINGUAL_TABLET | Freq: Two times a day (BID) | SUBLINGUAL | Status: DC
  Administered 2017-07-29 – 2017-07-31 (×4): 1 via SUBLINGUAL
  Filled 2017-07-28 (×5): qty 1

## 2017-07-28 MED ORDER — BUPRENORPHINE HCL-NALOXONE HCL 2-0.5 MG SL SUBL
2.0000 | SUBLINGUAL_TABLET | Freq: Once | SUBLINGUAL | Status: AC
  Administered 2017-07-28: 2 via SUBLINGUAL
  Filled 2017-07-28 (×2): qty 2

## 2017-07-28 MED ORDER — BUPRENORPHINE HCL-NALOXONE HCL 2-0.5 MG SL SUBL
2.0000 | SUBLINGUAL_TABLET | Freq: Once | SUBLINGUAL | Status: AC
  Administered 2017-07-28: 2 via SUBLINGUAL
  Filled 2017-07-28: qty 2

## 2017-07-28 NOTE — Care Management Note (Signed)
Case Management Note  Patient Details  Name: Norman Reed MRN: [id number] Date of Birth: Jun 04, 1986  Subjective/Objective:     Pt transferred from [facility] with endocarditis and withdrawl              Action/Plan:  PTA from home.  IVDA.  [facility] referred during time at [facility] however pt has was denied LTACH benefit on 8/13,   Attending has declined to perform peer to peer.  CM will continue to follow for discharge needs   Expected Discharge Date:                  Expected Discharge Plan:     In-House Referral:     Discharge planning Services  CM Consult  Post Acute Care Choice:    Choice offered to:     DME Arranged:    DME Agency:     HH Arranged:    HH Agency:     Status of Service:     If discussed at [employer], dates discussed:    Additional Comments:  Cherylann Parr, RN 07/28/2017, 12:10 PM

## 2017-07-28 NOTE — Progress Notes (Addendum)
PROGRESS NOTE [facility]   Norman Reed   AVW:[id number] DOB: November 20, 1986  DOA: 07/26/2017 PCP: No primary care provider on file.   Brief Narrative:  Norman Reed a 31 y.o.malewith medical history significant of anemia, chronic systolic heart failure, history of hepatitis C, history of cardiac arrest, history of [facility]rost[facility] Dr. Lamonte Sakai, history of IVDA and polysubstance abusewho was admitted on 07/22/2017 to [facility] with complaints of fever, chills, malaise, myalgias, emesis and intermittent chest pain for 2-3 days. Prior to that, he was not taking his carvedilol and warfarin for several weeks. He has been using IV heroin and methamphetamines again.  He last used 2 days prior to arrival at [facility].    At [facility], he was treated for the past few days with cefazolin, gentamicin and oral rifampin for and MSSAbacteremia after discussion with infectious diseases. Arrangements for transfer to [facility] were made, but unable to be done due to very tight availability of beds. Echocardiogram showed an EF of 25-30%,a septal bounce, a thickened mitral valve with 14 x 6 mm vegetation but no evidence of regurgitation. The tricuspid valve appeared to havesome thickening as well with trace TR.  He was subsequently transferred to this facility for higher level of care, formal consultation with infectious diseases and consultation with cardiothoracic surgery. TEE was ordered, on schedule for Thursday.     Subjective: Patient seen and examined, he is feeling anxious, nauseous and diaphoretic, he is going to be started on Suboxone by Dr Para March.  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 No safe to discharge patient home with a PICC line given ongoing IV drug use  Chronic systolic heart failure Secondary to valvular heart disease and multiple endocarditis episodes. Echocardiogram at [facility] shows severe global hypokinesis of the left ventricle with EF of 25-30%. No signs of decompensated heart failure. Monitor daily weights If there are signs of CHF exacerbation will consider cardiology consult  Right sided pulmonary embolism ? Septic embolus versus actual PE Likely need anticoagulation, although we'll hold starting now, as patient will need a PICC line. Probably can start Eliquis or Xarelto after PICC line placement, patient does not need to be on warfarin as he does not have a mechanical valve as per CT surgery INR 2.07 so he is somewhat anticoagulated Monitor INR daily  History of mitral valve and tricuspid valve replacement CT surgeon would not operate given ongoing IV drug use  Thrombocytopenia - resolved Likely secondary to infection   Polysubstance abuse Patient was showing signs of opiate withdrawal He has been started on Suboxone by Dr. Para March Continue to monitor Nicotine patch CIWA protocol  DVT prophylaxis: SCDs Code Status: Full code Family Communication: None at bedside Disposition Plan: SNF when medically stable  Consultants:   Cardiothoracic surgeon  Infectious disease  Addiction medicine  Procedures:   None  Antimicrobials: Anti-infectives    Start     Dose/Rate Route Frequency Ordered Stop   07/27/17 1200  gentamicin (GARAMYCIN) 120 mg in dextrose 5 % 50 mL IVPB  120 mg 106 mL/hr over 30 Minutes Intravenous Every 12 hours 07/27/17 0001     07/27/17 0015  gentamicin (GARAMYCIN) 120 mg in  dextrose 5 % 50 mL IVPB     120 mg 106 mL/hr over 30 Minutes Intravenous STAT 07/27/17 0001 07/27/17 0141   07/27/17 0015  rifampin (RIFADIN) capsule 300 mg     300 mg Oral 3 times daily 07/27/17 0009     07/26/17 2315  cefTRIAXone (ROCEPHIN) 2 g in dextrose 5 % 50 mL IVPB    Comments:  He was on cefazolin at [facility].   2 g 100 mL/hr over 30 Minutes Intravenous Every 12 hours 07/26/17 2301           Objective: Vitals:   07/27/17 2024 07/28/17 0012 07/28/17 0331 07/28/17 0821  BP: (!) 88/64 98/73 (!) 102/47 114/66  Pulse: 85 99 87 86  Resp: (!) 29 (!) 26 (!) 24 (!) 25  Temp: 99.1 F (37.3 C) 99.2 F (37.3 C) 98.9 F (37.2 C) 98.9 F (37.2 C)  TempSrc: Oral Oral Oral Oral  SpO2: 100% 100% 100% 100%    Intake/Output Summary (Last 24 hours) at 07/28/17 0908 Last data filed at 07/28/17 1478  Gross per 24 hour  Intake              856 ml  Output             3545 ml  Net            -2689 ml   There were no vitals filed for this visit.  Examination:  General exam: Mild anxious  HEENT: OP moist and clear Respiratory system: Clear to auscultation. No wheezes,crackle or rhonchi Cardiovascular system: S1S2 systolic murmur, no rub  Gastrointestinal system: Abdomen is nondistended, soft and nontender.  Central nervous system: Alert and oriented.  Extremities: No LE pedal edema.  Skin: No rashes, lesions or ulcers Psychiatry: Judgement and insight appear normal. Mood & affect appropriate.    Data Reviewed: I have personally reviewed following labs and imaging studies  CBC:  Recent Labs Lab 07/26/17 2235 07/27/17 0752 07/28/17 0450  WBC 7.3 9.1 8.4  NEUTROABS 4.0 5.4 PENDING  HGB 10.9* 10.3* 10.2*  HCT 31.2* 29.8* 30.2*  MCV 77.6* 77.8* 78.4  PLT 107* 123* 180   Basic Metabolic Panel:  Recent Labs Lab 07/26/17 2235 07/27/17 0752 07/28/17 0450  NA 134* 133* 134*  K 3.6 3.7 3.8  CL 102 103 102  CO2 24 23 25   GLUCOSE 114* 114* 110*  BUN 9 9 9     CREATININE 0.93 1.00 0.94  CALCIUM 8.1* 7.9* 8.1*  MG 1.5*  --   --    GFR: CrCl cannot be calculated (Unknown ideal weight.). Liver Function Tests:  Recent Labs Lab 07/26/17 2235  AST 21  ALT 12*  ALKPHOS 105  BILITOT 2.7*  PROT 6.2*  ALBUMIN 2.9*   No results for input(s): LIPASE, AMYLASE in the last 168 hours. No results for input(s): AMMONIA in the last 168 hours. Coagulation Profile:  Recent Labs Lab 07/26/17 2213 07/27/17 0752 07/27/17 1738 07/28/17 0450  INR 3.80 3.13 2.89 2.07   Cardiac Enzymes: No results for input(s): CKTOTAL, CKMB, CKMBINDEX, TROPONINI in the last 168 hours. BNP (last 3 results) No results for input(s): PROBNP in the last 8760 hours. HbA1C: No results for input(s): HGBA1C in the last 72 hours. CBG: No results for input(s): GLUCAP in the last 168 hours. Lipid Profile: No results for  input(s): CHOL, HDL, LDLCALC, TRIG, CHOLHDL, LDLDIRECT in the last 72 hours. Thyroid Function Tests: No results for input(s): TSH, T4TOTAL, FREET4, T3FREE, THYROIDAB in the last 72 hours. Anemia Panel: No results for input(s): VITAMINB12, FOLATE, FERRITIN, TIBC, IRON, RETICCTPCT in the last 72 hours. Sepsis Labs: No results for input(s): PROCALCITON, LATICACIDVEN in the last 168 hours.  Recent Results (from the past 240 hour(s))  MRSA PCR Screening     Status: None   Collection Time: 07/26/17  9:51 PM  Result Value Ref Range Status   MRSA by PCR NEGATIVE NEGATIVE Final    Comment:        The GeneXpert MRSA Assay (FDA approved for NASAL specimens only), is one component of a comprehensive MRSA colonization surveillance program. It is not intended to diagnose MRSA infection nor to guide or monitor treatment for MRSA infections.          Radiology Studies: Ct Angio Chest Pe W Or Wo Contrast  Result Date: 07/28/2017 CLINICAL DATA:  History of IV drug abuse, valvular replacement, tachycardia and dyspnea, history of known pulmonary emboli EXAM:  CT ANGIOGRAPHY CHEST WITH CONTRAST TECHNIQUE: Multidetector CT imaging of the chest was performed using the standard protocol during bolus administration of intravenous contrast. Multiplanar CT image reconstructions and MIPs were obtained to evaluate the vascular anatomy. CONTRAST:  100 mL Isovue 370 intravenous COMPARISON:  CT 08/08/ 2018 FINDINGS: Cardiovascular: Satisfactory opacification of the pulmonary arteries to the segmental level. Non filling of right lower lobe, segmental and subsegmental arterial branches, presumed occluded. Not much interval change compared to the prior CT. Under filling of subsegmental left lower lobe pulmonary arterial branches but suspect that this is related to excessive respiratory motion. Non aneurysmal aorta. Cardiomegaly. Small pericardial effusion. Mitral and tricuspid valve replacements. Central venous catheter tip in the SVC. Mediastinum/Nodes: Midline trachea. No thyroid mass. Increasing mediastinal adenopathy, 1.9 cm right paratracheal lymph node. Multiple enlarged paratracheal, AP window, and precarinal nodes. Esophagus within normal limits. Lungs/Pleura: Small bilateral pleural effusions, worsened in the interim. Increasing air space consolidation in the right middle and lower lobes compared to the prior CT. No pneumothorax. Upper Abdomen: No acute abnormality is seen Musculoskeletal: Post sternotomy changes. No acute or suspicious bone lesion Review of the MIP images confirms the above findings. IMPRESSION: 1. Nonfilling of right lower lobe, segmental and subsegmental pulmonary arterial branches which are presumably occluded, uncertain if this is acute or chronic but no interval change from recent comparison CT. No convincing evidence for new embolus. 2. Worsening bilateral pleural effusions. Worsening right middle lobe and right lower lobe airspace consolidation, possibly pneumonia 3. Increasing mediastinal adenopathy, could be reactive 4. Cardiomegaly with continued  small pericardial effusion Electronically Signed   By: Jasmine Pang M.D.   On: 07/28/2017 02:02      Scheduled Meds: . acetaminophen  650 mg Oral TID  . buprenorphine-naloxone  2 tablet Sublingual Once  . folic acid  1 mg Oral Daily  . multivitamin with minerals  1 tablet Oral Daily  . pantoprazole  40 mg Oral Daily  . rifampin  300 mg Oral TID  . thiamine  100 mg Oral Daily   Continuous Infusions: . cefTRIAXone (ROCEPHIN)  IV Stopped (07/27/17 2334)  . gentamicin Stopped (07/28/17 0029)     LOS: 2 days    Time spent: Total of 25 minutes spent with pt, greater than 50% of which was spent in discussion of  treatment, counseling and coordination of care    Latrelle Dodrill,  MD Pager: Text Page via [email or web address]  [phone]  If 7PM-7AM, please contact night-coverage [email or web address] Password [email or web address] 07/28/2017, 9:08 AM

## 2017-07-28 NOTE — Care Management (Addendum)
CM spoke with pharmacy to clarify consult regarding sending pt home on Zyvox (PO or IV).  Per pharmacy the final plan for antibiotics at discharge has not been determined.  Pt may need IV antibiotics or PO - the specific antibiotic may also change.  CM placed Zyvox discount card on shadow chart (pt has active insurance verified in [facility] and therefore MATCH can not be utilized) in case final discharge plan is PO Zyvox.  A new CM consult will be ordered once discharge antibiotics is determined

## 2017-07-28 NOTE — Progress Notes (Addendum)
Date: 07/28/2017  Patient name: Norman Reed  Medical record number: [id number]  Date of birth: 05/01/1986   I was asked to see Norman Reed by Dr. Daiva Eves for evaluation of acute opioid withdrawal and consideration of treatment with buprenorphine. [name] is a 31 year old man who has had a severe opioid use disorder since the age of 53. He tells me that it started with pills and then he transitioned to injecting heroin. 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 He was abstinent from opioids for about 6 months after that acute illness, however did not go through a formal treatment program. Over the last year he has been treating himself with Suboxone that he obtains from illicit sources. Tells me that he uses between 4-8 mg daily and generally feels well. There are days when he injects the Suboxone to give himself more coverage. He also reports injecting methamphetamines on occasion. Denies illicit benzo use.  He was admitted to [facility] 5 days ago with fevers. Blood cultures have grown a methicillin sensitive staph aureus. Reportedly he has a vegetation on his mechanical mitral valve seen on transthoracic echo. He also has new reduced left ventricular function. He has not used Suboxone or other opioids since approximately 2 days prior to presenting to [facility]. He was not treated with Suboxone as an inpatient. He reports currently feeling significant withdrawal type symptoms. He feels very nauseous, having vomiting, feels agitated. He's been unable to sleep. Last night he threatened to leave AMA if he was not given morphine, according to the chart. He was given 3 doses of oxycodone 5 mg yesterday, last dose around noon. He is eager to restart Suboxone and says "he knows it will help."  Patient lives in [place]. 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 His parents live in [place] and  they are aware of his opioid use disorder. He says that his mother is supportive. It's unclear what his living arrangements are currently, but he says that when he is ready to leave here he wants to find a halfway house for sober living. He is willing to do medication assisted therapy as an outpatient.  On exam his temperature is 98.9, heart rate 86, blood pressure 114/66. He is anxious appearing, diaphoretic. Appears very uncomfortable. Abdomen is soft and nontender. Extremities are warm and well-perfused with no joint swelling, no erythema or abscess. Heart is regular rate and rhythm. Lungs are clear throughout.  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  OUD with Acute Withdrawal: I think he is a good candidate to restart Suboxone. His COWS score is 17 consistent with moderate withdrawal. Last dose of short acting opioid was yesterday midday. I think it's okay to start Suboxone 4 mg now. I talked with Dr. Edward Jolly from [facility] who is in agreement. I will check back up with him in a few hours, we can repeat the Suboxone 2-4 milligram dose if he is still having withdrawal symptoms later today. I think our goal Suboxone dose would be 16 mg daily which we can probably start tomorrow. I discontinued oxycodone, I don't think he'll need short-acting opioids, he does not have an acute pain generator. I'm hopeful that the Suboxone will help the patient engage in his care and be less likely to leave the hospital before completing treatment.  [name], MD 07/28/2017, 8:44 AM  Pager: [phone]

## 2017-07-28 NOTE — Progress Notes (Signed)
  Date: 07/28/2017  Patient name: Norman Reed  Medical record number: [id number]  Date of birth: December 18, 1985   I evaluated [name] again about 2 hours after his induction dose of suboxone, which we are using to treat his acute withdrawal from severe opioid use disorder. He is looking much better, he feels his withdrawal symptoms are now controlled. He was very grateful for restarting suboxone. Agitation, diaphoresis, nausea are all resolved. So far today he has had 4mg  of Suboxone, I am going to give him another 4mg  dose this afternoon. Tomorrow he will start on Suboxone 8mg  bid. The pharmacist alerted me that Rifampin will cause reduced buprenorphine concentrations by up to 70%, so he may require a higher dose than usual of Suboxone while he is on Rifampin. I will keep an eye on his dosing and check in with him tomorrow. Please call me if you have any questions.    [name], MD 07/28/2017, 11:36 AM Pager: [phone]

## 2017-07-28 NOTE — Progress Notes (Signed)
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  -Blood culture collected 8/8 shows mssa -Blood culture collected 8/9 shows 1/2 staph aureus growth  -Blood culture collected 8/0 shows 2/2 no growth -Continue gentamicin, ceftriaxone, and rifampin   -renal function stable cr=0.94  -does not complain of any dizziness or hearing loss  Has tunneled cath in place currently, needs peripheral access if can be obtained -Would appreciate IV team assistance to get peripheral access  The patient does not complain of pain in any joints or bones for possible metastatic spread.   MRI could not be completed due to presence of epicardial leads. Ordered CT with contrast. Will d/c ceftriaxone if negative.  TTE on 8/9 showed severe global hypokinesis of lv, ef of 25-30%, mild aortic valve eccentric regurgitation, mitral valve ve31get208 ErneLinwo64od [place]: 1[facility]30les270-ErneLinwo24od [place]: 1[facility]47829Al61mtaTerrebonne Gene52raB78erTelecare SaGeanni 7019mKoreaStepR92micBaystate 63MaB55erBrooks CouGeanni 55502(250) ErneLinwo57od [place] [employer]: 1[facility]47829Al78mtaSurgery Center Of Eye Special73isB2erGunnison ValGean 92i06223-ErneLinwo33od [place] [employer]: 1[facility]47829Al64mtaHiLLCr59esB21erMadison Valley MeGeanni 70205938-ErneLinwo22od [place] [employer]: 1[facility]47829Al35mtaMontclair Hospi59taB61erFairfax CommunGeanni 38607(267)ErneLinwo55od [place] [employer]: 1[facility]47829Al68mtaWi57ndB84erKosciusko CommunGeanni 2303940 ErneLinwo42od [place] [employer]: 1[facility]47829Al67mtaBaton Rouge B2ehB72erSelect Specialty Hospital - ClevelGeanni 67206(320)ErneLinwo32od [place] [employer]: 1[facility]47829Al46mtaCr74osB65erMemorial Hermann Surgery CentGeanni 7062mKor632: 1[facility] Jack19son(351)ErneLinwo46od [place] [employer]: 1[facility]47829Al54mtaSouthcoast Hospitals Group - S24t.B79erPriscilla Chan & [facility] & TGeanni<MEASUREMENT30>20867 ErneLinwo41od [place] [employer]: 1[facility] Ce97nte(251)ErneLinwoo22d DLaurence Co: 1[facility]47829Al50mtaPlano56 SB33erAdventhealthGeannie( 34102(512)ErneLinwo82od [place] [employer]: 1[facility]47829Al23mtaAvalon Surgery And 55RoB40erSt [facility] Geanni 70504602-ErneLinwo68od [place] [employer]: 1[facility]47829Al12mtaMerced Ambulator34y B58erSan Diego EndoGeanni<MEASUREMENT55>40959-ErneLinwo41od [place] [employer]: 1[facility]47829Al41mtaEast Bay Division - Martinez32 OB79erOrthopaedicGeannie<MEASUREMENT41>90682-ErneLinwo70od Laurence [employer]: 1[facility]47829Al39mtaWyomin14g B9erAz West EndoscopGeanni 67704617-ErneLinwo58od Lau[place] [employer]: 1[facility]47829Al6mtaDigestive He66alB34erEastern Long IslGean 65i01970-ErneLinwo70od [place] [employer]: 1[facility]47829Al103mtaUnio46n B74erSebastian River MeGeannie 6083mKoreaStepR2micSt Anthony 63CoB65erNovi SuGean 60i09252-ErneLinwo23od [place] [employer]: 1[facility]47829Al37mtaCompass67 BB71erSt. JaGeannie 67507352-ErneLinwo81od [place] [employer]: 1[facility]47829Al77mtaMason51 DB59erPanola MeGeanni 55205(847)ErneLinwo81od [place] [employer]: 1[facility]47829Al88mtaEncompass Health [facility] Rehab53ilB39erAmbulatory Surgery Center OfGeanni 62804(253) ErneLinwo37od [place] [employer]: 1[facility]47829Al89mtaKentfield Rehab57ilB45erNorth Star Hospital - BGeanni 73802585-ErneLinwo45od [place] [employer]: 1[facility]47829Al77mtaColorectal Surgical And Gastroent18erB55erSurgicare Surgical Associates Of EnglewooGeanni 780565ErneLinwo29od [place] [employer]: 1[facility]47829Al81mtaSelect Speciality59 HB86erLegacy TransplGeannie 87907570 ErneLinwo68od [place] [employer]: 1[facility]47829Al63mtaMen[place]44 SB71erScripps GrGeannie( 87101559-ErneLinwo28od Laurence [employer]: 1[facility]47829Al18mtaSanford Health [facility] Abe10rdB74erSerenity Springs SpeciaGean 75i0261ErneLinwo21od [place]: 1[facility]47829Al64mtaBluegrass Surger61y B13erTri-City MeGeanni 53506(916)ErneLinwo93od [place]: 1[facility]47829Al71mtaVa Medical Cent75erB24erCalifornia Pacific Med Ctr-PaGeannie 24907815 ErneLinwo46od [place]: 1[facility] SapuLPa47829Al35mtaTristar Por42tlB78erMedstar Saint MarGeanni 19503(352)ErneLinwo82od [place]: 1[facility]47829Al25mtaBanner Goldfi75elB56erTexas Health Presbyterian HospiGeanni<MEASUREMENT6>8061ErneLinwo33od [place] [employer]: 1[facility]47829Al46mtaCarolinas Continuecare93 AB44erSt. Mary'S [facility], SGeanni 639010630-ErneLinwo9od [place]: 1[facility]47829Al57mtaSt Francis75 MB51erRegional Hand Center Of Central CaGeannie 4039mKoreaStepRicharda ONewell Coralid regurgitation and some thickening present on tv. Per cardiology 8/13 patient will not undergo surgical intervention due to his repeated ivdu and they recommended continue iv antibiotics.  The patient has epicardial leads (screw lead in RV and bipolar lead in RA) being used to stimulate the myocardium post his complete heart block note in 12/16 at [facility]. The epicardial leads are unlikely to get infected.  -The patient currently has a tunneled cath in place that should be removed once peripheral access it obtained. If peripheral access is not able to be obtained then may consider po zyvox and rifampin.  -Patient has been started on suboxone therapy. Will need to monitor dosage requirements due to concomitant rifampin use which can interfere.      . acetaminophen  650 mg Oral TID  . folic acid  1 mg Oral Daily  . multivitamin with minerals  1 tablet Oral Daily  . pantoprazole  40 mg Oral Daily  . rifampin  300 mg Oral TID  . thiamine  100 mg Oral Daily    SUBJECTIVE: Mr. Christoe was seen sitting up in his bed. He states that he has some sob, myalgias, and was feeling chilly yesterday. He also mentioned that he has epigastric and ruq pain. He denies any chest pain, isolated joint pain.  Review of Systems: ROS as above   Allergies  Allergen Reactions  . Penicillins Other (See Comments)    Childhood reaction    OBJECTIVE: Vitals:   07/27/17 1740 07/27/17 2024 07/28/17 0012 07/28/17 0331  BP: (!) 112/58 (!) 88/64 98/73 (!) 102/47  Pulse: 93 85 99 87  Resp: (!) 29 (!) 29 (!) 26 (!) 24  Temp:  99.1 F (37.3 C) 99.2 F (37.3 C) 98.9 F (37.2 C)  TempSrc:  Oral Oral Oral  SpO2: 98% 100% 100% 100%   There is no height or weight on file to calculate BMI.  Physical Exam  Constitutional: He has a sickly appearance. He appears distressed.  HENT:  Head: Normocephalic and atraumatic.  Cardiovascular: Normal rate and regular rhythm.   Pulmonary/Chest: Breath sounds normal. No respiratory distress.  Abdominal: Bowel sounds are normal. There is tenderness in the right upper quadrant and epigastric area.  Palpable epicardial leads  Skin: No  rash noted.  Psychiatric: His mood appears not anxious. His affect is not inappropriate. He is not agitated.    Lab Results Lab Results  Component Value Date   WBC 8.4 07/28/2017   HGB 10.2 (L) 07/28/2017   HCT 30.2 (L) 07/28/2017   MCV 78.4 07/28/2017   PLT 180 07/28/2017    Lab Results  Component Value Date   CREATININE 0.94 07/28/2017   BUN 9 07/28/2017   NA 134 (L) 07/28/2017   K 3.8 07/28/2017   CL 102 07/28/2017   CO2 25 07/28/2017    Lab Results  Component Value Date   ALT 12 (L) 07/26/2017   AST 21 07/26/2017   ALKPHOS 105 07/26/2017   BILITOT 2.7 (H)  07/26/2017     Microbiology: Recent Results (from the past 240 hour(s))  MRSA PCR Screening     Status: None   Collection Time: 07/26/17  9:51 PM  Result Value Ref Range Status   MRSA by PCR NEGATIVE NEGATIVE Final    Comment:        The GeneXpert MRSA Assay (FDA approved for NASAL specimens only), is one component of a comprehensive MRSA colonization surveillance program. It is not intended to diagnose MRSA infection nor to guide or monitor treatment for MRSA infections.     Lorenso Courier, MD [facility] [phone] pager   [phone] cell 07/28/2017, 8:17 AM

## 2017-07-28 NOTE — Progress Notes (Signed)
Per Dr. Chase Picket (radiologist) he does not want to proceed at this time with the MRI due to the leads in pt's chest. Leads are not attached to a generator therefore making them incompatible with MRI.

## 2017-07-29 ENCOUNTER — Inpatient Hospital Stay (HOSPITAL_COMMUNITY): Payer: BLUE CROSS/BLUE SHIELD

## 2017-07-29 DIAGNOSIS — D649 Anemia, unspecified: Secondary | ICD-10-CM

## 2017-07-29 DIAGNOSIS — T80219A Unspecified infection due to central venous catheter, initial encounter: Secondary | ICD-10-CM

## 2017-07-29 DIAGNOSIS — T826XXD Infection and inflammatory reaction due to cardiac valve prosthesis, subsequent encounter: Secondary | ICD-10-CM

## 2017-07-29 DIAGNOSIS — D696 Thrombocytopenia, unspecified: Secondary | ICD-10-CM

## 2017-07-29 LAB — CBC WITH DIFFERENTIAL/PLATELET
Basophils Absolute: 0 10*3/uL (ref 0.0–0.1)
Basophils Relative: 0 %
EOS PCT: 1 %
Eosinophils Absolute: 0.1 10*3/uL (ref 0.0–0.7)
HEMATOCRIT: 30.6 % — AB (ref 39.0–52.0)
Hemoglobin: 10.2 g/dL — ABNORMAL LOW (ref 13.0–17.0)
LYMPHS PCT: 18 %
Lymphs Abs: 1.8 10*3/uL (ref 0.7–4.0)
MCH: 26.4 pg (ref 26.0–34.0)
MCHC: 33.3 g/dL (ref 30.0–36.0)
MCV: 79.1 fL (ref 78.0–100.0)
MONO ABS: 1.2 10*3/uL — AB (ref 0.1–1.0)
Monocytes Relative: 12 %
NEUTROS PCT: 69 %
Neutro Abs: 6.7 10*3/uL (ref 1.7–7.7)
PLATELETS: 278 10*3/uL (ref 150–400)
RBC: 3.87 MIL/uL — AB (ref 4.22–5.81)
RDW: 15.5 % (ref 11.5–15.5)
WBC: 9.8 10*3/uL (ref 4.0–10.5)

## 2017-07-29 LAB — BASIC METABOLIC PANEL
ANION GAP: 8 (ref 5–15)
BUN: 11 mg/dL (ref 6–20)
CHLORIDE: 103 mmol/L (ref 101–111)
CO2: 24 mmol/L (ref 22–32)
CREATININE: 0.97 mg/dL (ref 0.61–1.24)
Calcium: 8.2 mg/dL — ABNORMAL LOW (ref 8.9–10.3)
GFR calc non Af Amer: >60 mL/min (ref >60)
Glucose, Bld: 105 mg/dL — ABNORMAL HIGH (ref 65–99)
POTASSIUM: 4.2 mmol/L (ref 3.5–5.1)
SODIUM: 135 mmol/L (ref 135–145)

## 2017-07-29 LAB — PROTIME-INR
INR: 1.31
Prothrombin Time: 16.4 seconds — ABNORMAL HIGH (ref 11.4–15.2)

## 2017-07-29 MED ORDER — GENTAMICIN IN SALINE 1.6-0.9 MG/ML-% IV SOLN
80.0000 mg | Freq: Three times a day (TID) | INTRAVENOUS | Status: DC
  Administered 2017-07-29 – 2017-07-30 (×3): 80 mg via INTRAVENOUS
  Filled 2017-07-29 (×6): qty 50

## 2017-07-29 MED ORDER — CEFAZOLIN SODIUM-DEXTROSE 2-4 GM/100ML-% IV SOLN
2.0000 g | Freq: Three times a day (TID) | INTRAVENOUS | Status: DC
  Administered 2017-07-30 – 2017-07-31 (×5): 2 g via INTRAVENOUS
  Filled 2017-07-29 (×7): qty 100

## 2017-07-29 MED ORDER — GENTAMICIN IN SALINE 1.6-0.9 MG/ML-% IV SOLN
80.0000 mg | Freq: Three times a day (TID) | INTRAVENOUS | Status: DC
  Filled 2017-07-29 (×2): qty 50

## 2017-07-29 MED ORDER — CEFAZOLIN SODIUM-DEXTROSE 2-4 GM/100ML-% IV SOLN
2.0000 g | Freq: Three times a day (TID) | INTRAVENOUS | Status: DC
  Filled 2017-07-29: qty 100

## 2017-07-29 MED ORDER — CHLORHEXIDINE GLUCONATE 4 % EX LIQD
CUTANEOUS | Status: AC
  Filled 2017-07-29: qty 15

## 2017-07-29 MED ORDER — LIDOCAINE HCL (PF) 1 % IJ SOLN
INTRAMUSCULAR | Status: AC
  Filled 2017-07-29: qty 30

## 2017-07-29 MED ORDER — SODIUM CHLORIDE 0.9 % IV SOLN
INTRAVENOUS | Status: DC
  Administered 2017-07-29: 23:00:00 via INTRAVENOUS

## 2017-07-29 NOTE — Progress Notes (Signed)
PROGRESS NOTE    Norman Reed  ZOX:[id number] DOB: 06-25-1986 DOA: 07/26/2017 PCP: No primary care provider on file.    Brief Narrative:  31 year old male who presented with MSSA endocarditis, transferred from [facility]. Patient is known to have chronic anemia, systolic heart failure, hepatitis C, history of mitral and tricuspid valve repair, and history of drug abuse. Patient was admitted on August 8th at the [facility], with a working diagnosis of endocarditis, apparently his mitral and tricuspid valve were involved, his ejection fraction was 25-30% of the left ventricle. Patient was transferred for further infection disease and cardiothoracic surgical evaluation.  On initial physical examination blood pressure 100/46, pulse rate 94, respiratory rate 28, temperature 100, oxygen saturation 100%. He was acutely ill, mucous membranes were dry, lungs were clear to auscultation bilaterally, heart S1-S2 present, soft diastolic murmur, the abdomen was soft nontender, no lower extremity edema.  Patient was admitted to hospital with acute endocarditis.  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 Acute infectious endocarditis due to MSSA. 14 mm to 6mm vegetation on thickened mitral valve, tricuspid valve thickening, mild aortic eccentric regurgitation, vegetation cannot rule out. Case discussed with Dr Daiva Eves, will attempt peripheral IV to continue IV antibiotic if possible. Patient has remained  afebrile.  2. Systolic heart failure. Clinically euvolemic, will continue to hold on IV fluids, continue telemetry monitoring, patient will have TEE in am, depending on LV function, patient may benefit from b blocker and ace inhibitor.   3. Mitral and tricuspid valve replacement. No clinical signs of valvular dysfunction, positive diastolic murmur at the mitral position. Will follow on TEE. Patient not candidate for new valve replacement due to risk of infection.  4. Polysubstance abuse. No clinical signs of withdrawal will continue with as needed lorazepam, patient has been placed on Suboxone with food toletration.   5. Chronic pain syndrome. Patient resumed on suboxone, with improvement of symptoms  6. Pulmonary embolism. CT chest suggestive embolism, patient with significant endocarditis, and high likely to be septic embolism, will repeat CT chest before discharge to define the need for anticoagulation.    DVT prophylaxis: scd Code Status: full  Family Communication: Disposition Plan:    Consultants:   I.D  Cardiothoracic Surgery   Addiction medicine  Procedures:     Antimicrobials:    cefazolin  Gnetamicin  Rifampin.    Subjective: No chest pain, no nausea or vomiting. No dyspnea.   Objective: Vitals:   07/29/17 0003 07/29/17 0433 07/29/17 0858 07/29/17 1121  BP: 117/62 109/60    Pulse:    (!) 102  Resp: 19   14  Temp: 99.2 F (37.3 C) (!) 100.5 F (38.1 C) (!) 97.5 F (36.4 C) 100.2 F (37.9 C)  TempSrc: Oral Oral Oral Oral  SpO2:      Weight:      Height:        Intake/Output Summary (Last 24 hours) at 07/29/17 1355 Last data filed at 07/29/17 1122  Gross per 24 hour  Intake              446 ml  Output             1575 ml  Net            -1129 ml   Filed Weights   07/28/17 1100  Weight: 81.3 kg (179 lb 3.7 oz)    Examination:  General exam: deconditioned E ENT: mild pallor, no icterus, oral mucosa moist.  Respiratory system: No  wheezing, rales or rhonchi,  Cardiovascular system: S1 & S2 heard, RRR. No JVD, positive diastolic murmur at the left lower sternal border, 3/6, no radiation, rubs, gallops or clicks. No pedal edema. Gastrointestinal system: Abdomen is nondistended, soft and nontender. No organomegaly or masses felt. Normal bowel sounds heard. Central nervous system: Alert and oriented. No focal neurological deficits. Extremities: Symmetric 5 x 5 power. Skin: No rashes, lesions or ulcers    Data Reviewed: I have personally reviewed following labs and imaging studies  CBC:  Recent Labs Lab 07/26/17 2235 07/27/17 0752 07/28/17 0450 07/29/17 0535  WBC 7.3 9.1 8.4 9.8  NEUTROABS 4.0 5.4 5.2 6.7  HGB 10.9* 10.3* 10.2* 10.2*  HCT 31.2* 29.8* 30.2* 30.6*  MCV 77.6* 77.8* 78.4 79.1  PLT 107* 123* 180 278   Basic Metabolic Panel:  Recent Labs Lab 07/26/17 2235 07/27/17 0752 07/28/17 0450 07/29/17 0535  NA 134* 133* 134* 135  K 3.6 3.7 3.8 4.2  CL 102 103 102 103  CO2 24 23 25 24   GLUCOSE 114* 114* 110* 105*  BUN 9 9 9 11   CREATININE 0.93 1.00 0.94 0.97  CALCIUM 8.1* 7.9* 8.1* 8.2*  MG 1.5*  --   --   --    GFR: Estimated Creatinine Clearance: 123.3 mL/min (by C-G formula based on SCr of 0.97 mg/dL). Liver Function Tests:  Recent Labs Lab 07/26/17 2235  AST 21  ALT 12*  ALKPHOS 105  BILITOT 2.7*  PROT 6.2*  ALBUMIN 2.9*   No results for input(s): LIPASE, AMYLASE in the last 168 hours. No results for input(s): AMMONIA in the last 168 hours. Coagulation Profile:  Recent Labs Lab 07/26/17 2213 07/27/17 0752 07/27/17 1738 07/28/17 0450 07/29/17 0535  INR 3.80 3.13 2.89 2.07 1.31   Cardiac Enzymes: No results for input(s): CKTOTAL, CKMB, CKMBINDEX, TROPONINI in the last 168 hours. BNP (last 3 results) No results for input(s): PROBNP in the last 8760 hours. HbA1C: No results for input(s): HGBA1C in the last 72 hours. CBG: No results for input(s): GLUCAP in the last 168  hours. Lipid Profile: No results for input(s): CHOL, HDL, LDLCALC, TRIG, CHOLHDL, LDLDIRECT in the last 72 hours. Thyroid Function Tests: No results for input(s): TSH, T4TOTAL, FREET4, T3FREE, THYROIDAB in the last 72 hours. Anemia Panel: No results for input(s): VITAMINB12, FOLATE, FERRITIN, TIBC, IRON, RETICCTPCT in the last 72 hours. Sepsis Labs: No results for input(s): PROCALCITON, LATICACIDVEN in the last 168 hours.  Recent Results (from the past 240 hour(s))  MRSA PCR Screening     Status: None   Collection Time: 07/26/17  9:51 PM  Result Value Ref Range Status   MRSA by PCR NEGATIVE NEGATIVE Final    Comment:        The GeneXpert MRSA Assay (FDA approved for NASAL specimens only), is one component of a comprehensive MRSA colonization surveillance program. It is not intended to diagnose MRSA infection nor to guide or monitor treatment for MRSA infections.  Radiology Studies: Ct Head W Contrast  Result Date: 07/28/2017 CLINICAL DATA:  Headaches and bacteremia EXAM: CT HEAD WITH CONTRAST TECHNIQUE: Contiguous axial images were obtained from the base of the skull through the vertex with intravenous contrast. CONTRAST:  75mL ISOVUE-300 IOPAMIDOL (ISOVUE-300) INJECTION 61% COMPARISON:  None. FINDINGS: Brain: No mass lesion, intraparenchymal hemorrhage or extra-axial collection. No evidence of acute cortical infarct. Brain parenchyma and CSF-containing spaces are normal for age. Vascular: No hyperdense vessel or unexpected calcification. Skull: Normal visualized skull base, calvarium and extracranial soft tissues. Sinuses/Orbits: No sinus fluid levels or advanced mucosal thickening. No mastoid effusion. Normal orbits. IMPRESSION: Normal head CT.  No abnormal enhancement. Electronically Signed   By: Deatra Robinson M.D.   On: 07/28/2017 19:30   Ct Angio Chest Pe W Or Wo Contrast  Result Date: 07/28/2017 CLINICAL DATA:  History of IV drug abuse, valvular replacement,  tachycardia and dyspnea, history of known pulmonary emboli EXAM: CT ANGIOGRAPHY CHEST WITH CONTRAST TECHNIQUE: Multidetector CT imaging of the chest was performed using the standard protocol during bolus administration of intravenous contrast. Multiplanar CT image reconstructions and MIPs were obtained to evaluate the vascular anatomy. CONTRAST:  100 mL Isovue 370 intravenous COMPARISON:  CT 08/08/ 2018 FINDINGS: Cardiovascular: Satisfactory opacification of the pulmonary arteries to the segmental level. Non filling of right lower lobe, segmental and subsegmental arterial branches, presumed occluded. Not much interval change compared to the prior CT. Under filling of subsegmental left lower lobe pulmonary arterial branches but suspect that this is related to excessive respiratory motion. Non aneurysmal aorta. Cardiomegaly. Small pericardial effusion. Mitral and tricuspid valve replacements. Central venous catheter tip in the SVC. Mediastinum/Nodes: Midline trachea. No thyroid mass. Increasing mediastinal adenopathy, 1.9 cm right paratracheal lymph node. Multiple enlarged paratracheal, AP window, and precarinal nodes. Esophagus within normal limits. Lungs/Pleura: Small bilateral pleural effusions, worsened in the interim. Increasing air space consolidation in the right middle and lower lobes compared to the prior CT. No pneumothorax. Upper Abdomen: No acute abnormality is seen Musculoskeletal: Post sternotomy changes. No acute or suspicious bone lesion Review of the MIP images confirms the above findings. IMPRESSION: 1. Nonfilling of right lower lobe, segmental and subsegmental pulmonary arterial branches which are presumably occluded, uncertain if this is acute or chronic but no interval change from recent comparison CT. No convincing evidence for new embolus. 2. Worsening bilateral pleural effusions. Worsening right middle lobe and right lower lobe airspace consolidation, possibly pneumonia 3. Increasing  mediastinal adenopathy, could be reactive 4. Cardiomegaly with continued small pericardial effusion Electronically Signed   By: Jasmine Pang M.D.   On: 07/28/2017 02:02        Scheduled Meds: . acetaminophen  650 mg Oral TID  . buprenorphine-naloxone  1 tablet Sublingual BID  . chlorhexidine      . folic acid  1 mg Oral Daily  . lidocaine (PF)      . multivitamin with minerals  1 tablet Oral Daily  . pantoprazole  40 mg Oral Daily  . rifampin  300 mg Oral TID  . thiamine  100 mg Oral Daily   Continuous Infusions: .  ceFAZolin (ANCEF) IV    . gentamicin       LOS: 3 days      Adelia Baptista [name], MD [facility] Pager [phone]  If 7PM-7AM, please contact night-coverage [email or web address] Password [email or web address] 07/29/2017, 1:55 PM

## 2017-07-29 NOTE — Progress Notes (Signed)
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  -Blood culture collected 8/8 shows mssa -Blood culture collected 8/9 shows 1/2 staph aureus growth  -Blood culture collected 8/0 shows 2/2 no growth  Patient's tunneled line removed today 8/15. Peripheral iv access placed -continue with iv gentamicin, iv cefazolin, and po rifampin  CT with contrast 8/14 was normal with no indication of an intracranial process.  -changed ceftriaxone to cefazolin  Repeat blood culture for clearance now that patient is on "line holiday"   Superficial Lower Extremity Nodules The patient is noted to have two small tender 1-2 cm nodules present on the posterior aspect of his left calf. Unlikely to be a alternate presentation of oslers nodes. Can possibly be due to superficial clot or possible abscess from site of ivdu.  -currently not on any anticoagulation. Previously there was suspicion for sepsis induced thrombocytopenia, however platelet levels are normal today (8/15) at 278   IVDU -Continue patient's suboxone therapy to prevent withdrawal. Close monitoring of dose is required due to concomitant rifampin use which can interfere.    Marland Kitchen acetaminophen  650 mg Oral TID  . buprenorphine-naloxone  1 tablet Sublingual BID  . folic acid  1 mg Oral Daily  . multivitamin with minerals  1 tablet Oral Daily  . pantoprazole  40 mg Oral Daily  . rifampin  300 mg Oral TID  . thiamine  100 mg Oral Daily    SUBJECTIVE: Mr. Laudon was asleep in his bed with his friends at his bedside. Overnight his temperature was 99-100 and he stated that he has been having chills. He has also noted two 1-2cm nodules on the  posterior aspect of his left calf. He denied any headaches, sob, chest pain, or anxiety.   Review of Systems: ROS as above  Allergies  Allergen Reactions  . Penicillins Other (See Comments)    Childhood reaction    OBJECTIVE: Vitals:   07/28/17 2000 07/29/17 0000 07/29/17 0003 07/29/17 0433  BP: (!) 122/59 117/62 117/62 109/60  Pulse: 77 86    Resp: 18  19   Temp: 98.8 F (37.1 C)  99.2 F (37.3 C) (!) 100.5 F (38.1 C)  TempSrc: Oral  Oral Oral  SpO2: 100% 100%    Weight:      Height:       Body mass index is 24.31 kg/m.  Physical Exam  Constitutional: He appears lethargic. He appears not jaundiced. He appears healthy.  Non-toxic appearance. He does not have a sickly appearance. No distress.  HENT:  Head: Normocephalic and atraumatic.  Cardiovascular: Normal rate, regular rhythm, normal heart sounds and intact distal pulses.   Pulmonary/Chest: Effort normal and breath sounds normal. No respiratory distress. He has no wheezes.  Abdominal: Soft. Bowel sounds are normal. He exhibits no distension. There is no tenderness.  Neurological: He appears lethargic.  Skin:  Two 1-2 cm nodules noted on the posterior aspect of the left calf.  Psychiatric: Mood, memory, affect and judgment normal.    Lab Results Lab Results  Component Value Date   WBC 9.8 07/29/2017  HGB 10.2 (L) 07/29/2017   HCT 30.6 (L) 07/29/2017   MCV 79.1 07/29/2017   PLT 278 07/29/2017    Lab Results  Component Value Date   CREATININE 0.97 07/29/2017   BUN 11 07/29/2017   NA 135 07/29/2017   K 4.2 07/29/2017   CL 103 07/29/2017   CO2 24 07/29/2017    Lab Results  Component Value Date   ALT 12 (L) 07/26/2017   AST 21 07/26/2017   ALKPHOS 105 07/26/2017   BILITOT 2.7 (H) 07/26/2017     Microbiology: Recent Results (from the past 240 hour(s))  MRSA PCR Screening     Status: None   Collection Time: 07/26/17  9:51 PM  Result Value Ref Range Status   MRSA by PCR NEGATIVE NEGATIVE Final     Comment:        The GeneXpert MRSA Assay (FDA approved for NASAL specimens only), is one component of a comprehensive MRSA colonization surveillance program. It is not intended to diagnose MRSA infection nor to guide or monitor treatment for MRSA infections.     Lorenso Courier, MD [facility] [phone] pager   [phone] cell 07/29/2017, 8:52 AM

## 2017-07-29 NOTE — Progress Notes (Signed)
    [facility] has been requested to perform a transesophageal echocardiogram on 07/30/17 for Bacteremia.  After careful review of history and examination, the risks and benefits of transesophageal echocardiogram have been explained including risks of esophageal damage, perforation (1:10,000 risk), bleeding, pharyngeal hematoma as well as other potential complications associated with conscious sedation including aspiration, arrhythmia, respiratory failure and death. Alternatives to treatment were discussed, questions were answered. Patient is willing to proceed.   Laverda Page, NP-C 07/29/2017 2:20 PM

## 2017-07-29 NOTE — Progress Notes (Addendum)
Pharmacy Antibiotic Note  Norman Reed is a 31 y.o. 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  Pharmacy managing gentamicin dosing.   WBC wnl, Tmax 100.81F, with stable renal function   Plan: Continue gentamicin 120mg  IV Q12H with trough of 1.2 at OSH Will redraw trough tomorrow to confirm OSH results and r/o accumulation  Continue ceftriaxone 2 gm IV Q 12 hours Continue rifampin 300 mg three times daily    Temp (24hrs), Avg:98.9 F (37.2 C), Min:97.5 F (36.4 C), Max:100.5 F (38.1 C)   Recent Labs Lab 07/26/17 2235 07/27/17 0752 07/28/17 0450 07/29/17 0535  WBC 7.3 9.1 8.4 9.8  CREATININE 0.93 1.00 0.94 0.97     Allergies  Allergen Reactions  . Penicillins Other (See Comments)    Childhood reaction    Thank you for allowing pharmacy to be a part of this patient's care.  Vinnie Level, PharmD., BCPS Clinical Pharmacist Pager [phone]  Addendum: Will change gentamicin to 1 mg/kg (80 mg) IV Q 8 hours as this is optimal synergy dosing. Will check levels to ensure appropriate troughs. Per ID, will also change ceftriaxone to cefazolin 2 gm IV Q 8 hours for bacteremia.  Vinnie Level, PharmD., BCPS Clinical Pharmacist Pager [phone]

## 2017-07-29 NOTE — Progress Notes (Signed)
Went down  to IR for removal of cvc cath.

## 2017-07-29 NOTE — Progress Notes (Signed)
Transferred to 3East room20, stable, report given to RN, belongings with pt.

## 2017-07-29 NOTE — Procedures (Addendum)
Requested to remove tunneled central line placed at outside facility. Pt brought to radiology holding. Standard non-tunneled triple lumen central venous catheter identified in (R)IJ  Sterile prep and drape and standard protocol followed. Catheter removed without difficulty. Pressure applied to achieve hemostasis. Dressing applied.  Brayton El PA-C Interventional Radiology 07/29/2017 12:31 PM

## 2017-07-29 NOTE — Progress Notes (Addendum)
      INFECTIOUS DISEASE ATTENDING ADDENDUM:   Date: 07/29/2017  Patient name: Norman Reed  Medical record number: [id number]  Date of birth: 07/19/1986    Mr. Crisco feels a bit better today. His CT of the head was negative for CNS infection so he can go back from ceftriaxone to cefazolin.  On exam he is fairly comfortable his lungs were actually relatively clear to auscultation. Cardiac exam did not reveal new murmurs  His CT of the chest had shown fairly extensive pulmonary disease and pleural effusions that I think may need to be drained for diagnostic and more reportedly therapeutic reasons.  Today I'm hoping that interventional radiology can remove his tunneled central line.  When that happens we will have him on oral Zyvox and oral rifampin and see if we can document clearance of his bacteremia with out any plastic in his bloodstream.  Once this has happened we can then proceed to place an IV again and resume more textbook antibiotics with cefazolin and rifampin and gentamicin. He claims to be open to being discharged to a skilled nursing facility to be treated with IV antibiotics.   NOTE SINCE TIME OF WRITING THIS NOTE, IV TEAM WERE ABLE TO GAIN ACCESS.  THEREFORE WE WILL CONTINUE IV ANCEF, GENT AND ORAL RIFAMPIN RATHER THAN TRY ORAL ZYVOX  REPEAT BLOOD CULTURES NOW THAT LINE IS OUT  Cardiology to perform TEE  Would also ask CCM vs IR re therapeutic and  diagnostic thoracentesis  Dr. Campbell take over the service tomorrow.  Cornelius Van Dam 07/29/2017, 11:56 AM  

## 2017-07-29 NOTE — Progress Notes (Signed)
Patient just started complaining of sudden cramping pain in the left calf. There was no swelling or redness noted, but 2 small knots could be felt right below the popliteal fossa. Toradol given as ordered for pain and hospitalist paged. Will continue to monitor. 

## 2017-07-29 NOTE — Progress Notes (Signed)
Cane back from the IR right neck dressing dry and intact. Continue to monitor.

## 2017-07-30 ENCOUNTER — Inpatient Hospital Stay (HOSPITAL_COMMUNITY): Payer: BLUE CROSS/BLUE SHIELD | Admitting: Anesthesiology

## 2017-07-30 ENCOUNTER — Inpatient Hospital Stay (HOSPITAL_COMMUNITY): Payer: BLUE CROSS/BLUE SHIELD

## 2017-07-30 ENCOUNTER — Encounter (HOSPITAL_COMMUNITY): Payer: Self-pay | Admitting: *Deleted

## 2017-07-30 ENCOUNTER — Encounter (HOSPITAL_COMMUNITY)
Admission: AD | Discharge status: Against Medical Advice | Payer: Self-pay | Source: Other Acute Inpatient Hospital | Attending: Internal Medicine

## 2017-07-30 DIAGNOSIS — M79609 Pain in unspecified limb: Secondary | ICD-10-CM

## 2017-07-30 DIAGNOSIS — A4101 Sepsis due to Methicillin susceptible Staphylococcus aureus: Secondary | ICD-10-CM

## 2017-07-30 DIAGNOSIS — I2699 Other pulmonary embolism without acute cor pulmonale: Secondary | ICD-10-CM

## 2017-07-30 DIAGNOSIS — I351 Nonrheumatic aortic (valve) insufficiency: Secondary | ICD-10-CM

## 2017-07-30 HISTORY — PX: TEE WITHOUT CARDIOVERSION: SHX5443

## 2017-07-30 LAB — CBC WITH DIFFERENTIAL/PLATELET
BASOS ABS: 0 10*3/uL (ref 0.0–0.1)
Basophils Relative: 0 %
EOS PCT: 1 %
Eosinophils Absolute: 0.2 10*3/uL (ref 0.0–0.7)
HCT: 33.5 % — ABNORMAL LOW (ref 39.0–52.0)
Hemoglobin: 11.5 g/dL — ABNORMAL LOW (ref 13.0–17.0)
LYMPHS PCT: 16 %
Lymphs Abs: 1.9 10*3/uL (ref 0.7–4.0)
MCH: 27.3 pg (ref 26.0–34.0)
MCHC: 34.3 g/dL (ref 30.0–36.0)
MCV: 79.6 fL (ref 78.0–100.0)
MONO ABS: 1.6 10*3/uL — AB (ref 0.1–1.0)
Monocytes Relative: 13 %
Neutro Abs: 8.3 10*3/uL — ABNORMAL HIGH (ref 1.7–7.7)
Neutrophils Relative %: 70 %
Platelets: 329 10*3/uL (ref 150–400)
RBC: 4.21 MIL/uL — ABNORMAL LOW (ref 4.22–5.81)
RDW: 15.4 % (ref 11.5–15.5)
WBC: 11.9 10*3/uL — ABNORMAL HIGH (ref 4.0–10.5)

## 2017-07-30 LAB — BASIC METABOLIC PANEL
ANION GAP: 10 (ref 5–15)
BUN: 11 mg/dL (ref 6–20)
CALCIUM: 8.7 mg/dL — AB (ref 8.9–10.3)
CO2: 23 mmol/L (ref 22–32)
CREATININE: 0.96 mg/dL (ref 0.61–1.24)
Chloride: 100 mmol/L — ABNORMAL LOW (ref 101–111)
GFR calc non Af Amer: >60 mL/min (ref >60)
Glucose, Bld: 97 mg/dL (ref 65–99)
Potassium: 4.4 mmol/L (ref 3.5–5.1)
SODIUM: 133 mmol/L — AB (ref 135–145)

## 2017-07-30 LAB — PROTIME-INR
INR: 1.18
Prothrombin Time: 15.1 seconds (ref 11.4–15.2)

## 2017-07-30 SURGERY — ECHOCARDIOGRAM, TRANSESOPHAGEAL
Anesthesia: Monitor Anesthesia Care

## 2017-07-30 MED ORDER — SODIUM CHLORIDE 0.9 % IV SOLN: INTRAVENOUS | Status: DC | PRN

## 2017-07-30 MED ORDER — LIDOCAINE HCL (CARDIAC) 20 MG/ML IV SOLN
INTRAVENOUS | Status: DC | PRN
  Administered 2017-07-30: 40 mg via INTRATRACHEAL

## 2017-07-30 MED ORDER — PROPOFOL 10 MG/ML IV BOLUS
INTRAVENOUS | Status: DC | PRN
  Administered 2017-07-30: 40 mg via INTRAVENOUS
  Administered 2017-07-30: 50 mg via INTRAVENOUS
  Administered 2017-07-30: 40 mg via INTRAVENOUS
  Administered 2017-07-30: 50 mg via INTRAVENOUS

## 2017-07-30 MED ORDER — LACTATED RINGERS IV SOLN
INTRAVENOUS | Status: DC | PRN
  Administered 2017-07-30: 13:00:00 via INTRAVENOUS

## 2017-07-30 MED ORDER — IOPAMIDOL (ISOVUE-370) INJECTION 76%
INTRAVENOUS | Status: AC
  Administered 2017-07-30: 100 mL
  Filled 2017-07-30: qty 100

## 2017-07-30 MED ORDER — PROPOFOL 500 MG/50ML IV EMUL
INTRAVENOUS | Status: DC | PRN
  Administered 2017-07-30: 100 ug/kg/min via INTRAVENOUS

## 2017-07-30 MED ORDER — GENTAMICIN IN SALINE 1.6-0.9 MG/ML-% IV SOLN
80.0000 mg | Freq: Three times a day (TID) | INTRAVENOUS | Status: DC
  Administered 2017-07-30 – 2017-07-31 (×3): 80 mg via INTRAVENOUS
  Filled 2017-07-30 (×4): qty 50

## 2017-07-30 MED ORDER — PHENYLEPHRINE HCL 10 MG/ML IJ SOLN
INTRAMUSCULAR | Status: DC | PRN
  Administered 2017-07-30: 40 ug via INTRAVENOUS
  Administered 2017-07-30 (×2): 80 ug via INTRAVENOUS

## 2017-07-30 NOTE — H&P (View-Only) (Signed)
      INFECTIOUS DISEASE ATTENDING ADDENDUM:   Date: 07/29/2017  Patient name: Norman Reed  Medical record number: [id number]  Date of birth: [birth year]    Mr. Lollie Marrow feels a bit better today. His CT of the head was negative for CNS infection so he can go back from ceftriaxone to cefazolin.  On exam he is fairly comfortable his lungs were actually relatively clear to auscultation. Cardiac exam did not reveal new murmurs  His CT of the chest had shown fairly extensive pulmonary disease and pleural effusions that I think may need to be drained for diagnostic and more reportedly therapeutic reasons.  Today I'm hoping that interventional radiology can remove his tunneled central line.  When that happens we will have him on oral Zyvox and oral rifampin and see if we can document clearance of his bacteremia with out any plastic in his bloodstream.  Once this has happened we can then proceed to place an IV again and resume more textbook antibiotics with cefazolin and rifampin and gentamicin. He claims to be open to being discharged to a skilled nursing facility to be treated with IV antibiotics.   NOTE SINCE TIME OF WRITING THIS NOTE, IV TEAM WERE ABLE TO GAIN ACCESS.  THEREFORE WE WILL CONTINUE IV ANCEF, GENT AND ORAL RIFAMPIN RATHER THAN TRY ORAL ZYVOX  REPEAT BLOOD CULTURES NOW THAT LINE IS OUT  Cardiology to perform TEE  Would also ask CCM vs IR re therapeutic and  diagnostic thoracentesis  Dr. Orvan Falconer take over the service tomorrow.  Acey Lav 07/29/2017, 11:56 AM

## 2017-07-30 NOTE — Interval H&P Note (Signed)
History and Physical Interval Note:  07/30/2017 1:47 PM  Norman Reed  has presented today for surgery, with the diagnosis of BACTEREMIA  The various methods of treatment have been discussed with the patient and family. After consideration of risks, benefits and other options for treatment, the patient has consented to  Procedure(s): TRANSESOPHAGEAL ECHOCARDIOGRAM (TEE) (N/A) as a surgical intervention .  The patient's history has been reviewed, patient examined, no change in status, stable for surgery.  I have reviewed the patient's chart and labs.  Questions were answered to the patient's satisfaction.     Bensimhon, Reuel Boom

## 2017-07-30 NOTE — Progress Notes (Signed)
*  Preliminary Results* Bilateral lower extremity venous duplex completed. Bilateral lower extremities are negative for deep vein thrombosis. There is no evidence of Baker's cyst bilaterally.  07/30/2017 4:19 PM Gertie Fey, BS, RVT, RDCS, RDMS

## 2017-07-30 NOTE — Transfer of Care (Signed)
Immediate Anesthesia Transfer of Care Note  Patient: Norman Reed  Procedure(s) Performed: Procedure(s): TRANSESOPHAGEAL ECHOCARDIOGRAM (TEE) (N/A)  Patient Location: Endoscopy Unit  Anesthesia Type:MAC  Level of Consciousness: awake, alert  and oriented  Airway & Oxygen Therapy: Patient Spontanous Breathing and Patient connected to nasal cannula oxygen  Post-op Assessment: Report given to RN, Post -op Vital signs reviewed and stable and Patient moving all extremities X 4  Post vital signs: Reviewed and stable  Last Vitals:  Vitals:   07/30/17 1200 07/30/17 1307  BP: (!) 120/58 (!) 125/53  Pulse: 94   Resp: 20 17  Temp: 36.8 C 36.9 C  SpO2: 96% 100%    Last Pain:  Vitals:   07/30/17 1307  TempSrc: Oral  PainSc:       Patients Stated Pain Goal: 2 (74/93/55 2174)  Complications: No apparent anesthesia complications

## 2017-07-30 NOTE — Anesthesia Preprocedure Evaluation (Signed)
Anesthesia Evaluation  Patient identified by MRN, date of birth, ID band Patient awake    Reviewed: Allergy & Precautions, NPO status , Patient's Chart, lab work & pertinent test results, reviewed documented beta blocker date and time   Airway Mallampati: I  TM Distance: >3 FB Neck ROM: Full    Dental  (+) Teeth Intact   Pulmonary neg pulmonary ROS, Current Smoker,    breath sounds clear to auscultation       Cardiovascular Pt. on home beta blockers +CHF   Rhythm:Regular Rate:Normal     Neuro/Psych negative neurological ROS  negative psych ROS   GI/Hepatic negative GI ROS, (+) Hepatitis -, C  Endo/Other  negative endocrine ROS  Renal/GU negative Renal ROS     Musculoskeletal negative musculoskeletal ROS (+)   Abdominal   Peds  Hematology negative hematology ROS (+)   Anesthesia Other Findings Day of surgery medications reviewed with the patient.  Reproductive/Obstetrics                             Anesthesia Physical Anesthesia Plan  ASA: III  Anesthesia Plan: MAC   Post-op Pain Management:    Induction: Intravenous  PONV Risk Score and Plan: Propofol infusion  Airway Management Planned: Natural Airway  Additional Equipment:   Intra-op Plan:   Post-operative Plan:   Informed Consent: I have reviewed the patients History and Physical, chart, labs and discussed the procedure including the risks, benefits and alternatives for the proposed anesthesia with the patient or authorized representative who has indicated his/her understanding and acceptance.     Plan Discussed with: CRNA  Anesthesia Plan Comments:         Anesthesia Quick Evaluation

## 2017-07-30 NOTE — CV Procedure (Signed)
    TRANSESOPHAGEAL ECHOCARDIOGRAM   NAME:  Norman Reed   MRN: [id number] DOB:  1986/07/08   ADMIT DATE: 07/26/2017  INDICATIONS:   PROCEDURE:   Informed consent was obtained prior to the procedure. The risks, benefits and alternatives for the procedure were discussed and the patient comprehended these risks.  Risks include, but are not limited to, cough, sore throat, vomiting, nausea, somnolence, esophageal and stomach trauma or perforation, bleeding, low blood pressure, aspiration, pneumonia, infection, trauma to the teeth and death.    After a procedural time-out, the patient was sedated by the anesthesia service. Once an appropriate level of sedation was achieved, the transesophageal probe was inserted in the esophagus and stomach without difficulty and multiple views were obtained.    COMPLICATIONS:    There were no immediate complications.  FINDINGS:  LEFT VENTRICLE: EF = 40-45%. Global HK  Dilated. 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   RIGHT ATRIUM: Dialted  AORTIC VALVE:  Trileaflet. There seems to be an area of malcoaptation between the [facility] and NCC with moderate AI. No evidence of vegetation, abscess or valve destruction.   MITRAL VALVE:    Mechanical bioprosthesis. Functioning normally. There is a small mobile vegetation on the MV prosthesis. Trivial MR  TRICUSPID VALVE: Mechanical bioprosthesis. Functionally normally. No vegetation. No TR  PULMONIC VALVE: Grossly normal. No vegetation   INTERATRIAL SEPTUM: No PFO or ASD.  PERICARDIUM: No effusion  DESCENDING AORTA: Trivial plaque   CONCLUSION:  Small mobile vegetation on mechanical mitral valve.   Bensimhon, Daniel,MD 2:31 PM

## 2017-07-30 NOTE — Progress Notes (Signed)
  Echocardiogram Echocardiogram Transesophageal has been performed.  Arvil Chaco 07/30/2017, 2:32 PM

## 2017-07-30 NOTE — Anesthesia Postprocedure Evaluation (Signed)
Anesthesia Post Note  Patient: Norman Reed  Procedure(s) Performed: Procedure(s) (LRB): TRANSESOPHAGEAL ECHOCARDIOGRAM (TEE) (N/A)     Patient location during evaluation: PACU Anesthesia Type: MAC Level of consciousness: awake and alert Pain management: pain level controlled Vital Signs Assessment: post-procedure vital signs reviewed and stable Respiratory status: spontaneous breathing, nonlabored ventilation, respiratory function stable and patient connected to nasal cannula oxygen Cardiovascular status: stable and blood pressure returned to baseline Anesthetic complications: no    Last Vitals:  Vitals:   07/30/17 1307 07/30/17 1420  BP: (!) 125/53 (!) 94/40  Pulse:    Resp: 17 18  Temp: 36.9 C 37.1 C  SpO2: 100% 100%    Last Pain:  Vitals:   07/30/17 1420  TempSrc: Oral  PainSc:                  [name]

## 2017-07-30 NOTE — Anesthesia Procedure Notes (Signed)
Procedure Name: MAC Date/Time: 07/30/2017 1:37 PM Performed by: Neldon Newport Pre-anesthesia Checklist: Timeout performed, Patient being monitored, Suction available, Emergency Drugs available and Patient identified Patient Re-evaluated:Patient Re-evaluated prior to induction Oxygen Delivery Method: Nasal cannula

## 2017-07-30 NOTE — Progress Notes (Addendum)
Benefit check sent for Linezolid 600mg  BID x 30 days  So team knows what to expect with cost to assist in making decision about abx at DC   Lia Hopping, RN        S/W TELENA @ [facility] # [phone]   LINEZOLID 600 MG BID    COVER- YES  CO-PAY- $ 80.00  TIER- 2 DRUG  PRIOR APPROVAL- NO   PREFERRED PHARMACY : [facility]  MAIL-ORDER FOR 90 DAYS SUPPLY $ 100.11
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
## 2017-07-30 NOTE — Progress Notes (Signed)
PROGRESS NOTE    Norman Reed  ZOX:[id number] DOB: [birth year] DOA: 07/26/2017 PCP: No primary care provider on file.    Brief Narrative:  31 year old male who presented with MSSA endocarditis, transferred from [facility]. Patient is known to have chronic anemia, systolic heart failure, hepatitis C, history of mitral and tricuspid valve repair, and history of drug abuse. Patient was admitted on August 8th at the [facility], with a working diagnosis of endocarditis, apparently his mitral and tricuspid valve were involved, his ejection fraction was 25-30% of the left ventricle. Patient was transferred for further infection disease and cardiothoracic surgical evaluation.  On initial physical examination blood pressure 100/46, pulse rate 94, respiratory rate 28, temperature 100, oxygen saturation 100%. He was acutely ill, mucous membranes were dry, lungs were clear to auscultation bilaterally, heart S1-S2 present, soft diastolic murmur, the abdomen was soft nontender, no lower extremity edema.  Patient was admitted to hospital with acute endocarditis.   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 Acute infectious endocarditis due to MSSA. Patient for TEE today, evaluation and follow up on valvular vegetation, mitral, tricuspid and possible aortic. Noted erythema nodosum on the right lower extremity. Will continue to follow up on blood cultures, will continue on cefazolin, gentamycin and rifampin,  with close follow up of renal function.   2. Systolic heart failure. Continue clinically euvolemic, continue telemetry monitoring. Follow on LV systolic function per TEE.  3. Mitral and tricuspid valve replacement. Will continue to follow CT surgery recommendations, no signs of decompensation, apparently not candidate for second surgical intervention.   4. Polysubstance abuse. No clinical signs of withdrawal, on as needed lorazepam. On Suboxone with good toletration.   5. Chronic pain syndrome. On suboxone.   6. Pulmonary embolism. Will repeat chest CT, and do dopplers US, to assess the need for anticoagulation. Patient oxygenating well, no chest pain or hypotension. High pretest probability for septic emboli.   DVT prophylaxis: scd Code Status: full  Family Communication: Disposition Plan:    Consultants:   I.D  Cardiothoracic Surgery   Addiction medicine  Procedures:     Antimicrobials:    cefazolin  Gnetamicin  Rifampin  Subjective: Patient with significant pain on his right lower extremity noted new painful nodules at the inner leg, no erythema but trace edema. No dyspnea or chest pain, no nausea or vomiting, has been NPO after midnight, positive headache.   Objective: Vitals:   07/29/17 1900 07/29/17 1959 07/30/17 0521 07/30/17 0755  BP: 117/61 (!) 119/59 (!) 102/57 114/62  Pulse: 88 (!) 175 95 87  Resp: 20 20 18 18   Temp: 99.2 F (37.3 C) 99.7 F (37.6 C) 98.1 F (36.7 C) 98.4 F (36.9 C)  TempSrc: Oral Oral Oral Oral  SpO2: 100% 100% 100% 100%  Weight: 80.3 kg (177 lb)  79 kg (174 lb 3.2 oz)   Height: 6' (1.829 m)       Intake/Output Summary (Last 24 hours) at 07/30/17 1106 Last data filed at 07/30/17 1042  Gross per 24 hour  Intake              200 ml  Output             2975 ml  Net            -2775 ml   Filed Weights   07/28/17 1100 07/29/17 1900 07/30/17 0521  Weight: 81.3 kg (179 lb 3.7 oz) 80.3 kg (177 lb) 79 kg (174 lb 3.2  oz)    Examination:  General exam: not in pain or dyspnea E ENT. Mild pallor, no icterus, oral mucosa moist.  Respiratory system: Clear to auscultation. Respiratory effort normal. Decreased breath sounds at the right side, no wheezing, rales or rhonchi.  Cardiovascular system: S1 & S2 heard, RRR. No JVD, + diastolic murmur at the left sternal border, rubs, gallops or clicks. No pedal edema. Gastrointestinal system: Abdomen is nondistended, soft and nontender. No organomegaly or masses felt. Normal bowel sounds heard. Central nervous system: Alert and oriented. No focal neurological deficits. Extremities: Symmetric 5 x 5 power. Skin: positive tender nodules at the right leg, about 1 cm diameter, with no rash or increase local temperature.     Data Reviewed: I have personally reviewed following labs and imaging studies  CBC:  Recent Labs Lab 07/26/17 2235 07/27/17 0752 07/28/17 0450 07/29/17 0535 07/30/17 0450  WBC 7.3 9.1 8.4 9.8 11.9*  NEUTROABS 4.0 5.4 5.2 6.7 8.3*  HGB 10.9* 10.3* 10.2* 10.2* 11.5*  HCT 31.2* 29.8* 30.2* 30.6* 33.5*  MCV 77.6* 77.8* 78.4 79.1 79.6  PLT 107* 123* 180 278 329   Basic Metabolic Panel:  Recent Labs Lab 07/26/17 2235 07/27/17 0752 07/28/17 0450 07/29/17 0535 07/30/17 0450  NA 134* 133* 134* 135 133*  K 3.6 3.7 3.8 4.2 4.4  CL 102 103 102 103 100*  CO2 24 23 25 24 23   GLUCOSE 114* 114* 110* 105* 97  BUN 9 9 9 11 11   CREATININE 0.93 1.00 0.94 0.97 0.96  CALCIUM 8.1* 7.9* 8.1* 8.2* 8.7*  MG 1.5*  --   --   --   --    GFR: Estimated Creatinine Clearance: 124.6 mL/min (by C-G formula based on SCr of 0.96 mg/dL). Liver Function Tests:  Recent Labs Lab 07/26/17 2235  AST 21  ALT 12*  ALKPHOS 105  BILITOT 2.7*  PROT 6.2*  ALBUMIN 2.9*   No results for input(s): LIPASE, AMYLASE in the last 168 hours. No results for input(s): AMMONIA in the last 168 hours. Coagulation Profile:  Recent Labs Lab 07/27/17 0752 07/27/17 1738  07/28/17 0450 07/29/17 0535 07/30/17 0450  INR 3.13 2.89 2.07 1.31 1.18   Cardiac Enzymes: No results for input(s): CKTOTAL, CKMB, CKMBINDEX, TROPONINI in the last 168 hours. BNP (last 3 results) No results for input(s): PROBNP in the last 8760 hours. HbA1C: No results for input(s): HGBA1C in the last 72 hours. CBG: No results for input(s): GLUCAP in the last 168 hours. Lipid Profile: No results for input(s): CHOL, HDL, LDLCALC, TRIG, CHOLHDL, LDLDIRECT in the last 72 hours. Thyroid Function Tests: No results for input(s): TSH, T4TOTAL, FREET4, T3FREE, THYROIDAB in the last 72 hours. Anemia Panel: No results for input(s): VITAMINB12, FOLATE, FERRITIN, TIBC, IRON, RETICCTPCT in the last 72 hours. Sepsis Labs: No results for input(s): PROCALCITON, LATICACIDVEN in the last 168 hours.  Recent Results (from the past 240 hour(s))  MRSA PCR Screening     Status: None   Collection Time: 07/26/17  9:51 PM  Result Value Ref Range Status   MRSA by PCR NEGATIVE NEGATIVE Final    Comment:        The GeneXpert MRSA Assay (FDA approved for NASAL specimens only), is one component of a comprehensive MRSA colonization surveillance program. It is not intended to diagnose MRSA infection nor to guide or monitor treatment for MRSA infections.   Culture, blood (routine x 2)     Status: None (Preliminary result)   Collection Time: 07/29/17  3:27 PM  Result Value Ref Range Status   Specimen Description BLOOD LEFT ARM  Final   Special Requests IN PEDIATRIC BOTTLE Blood Culture adequate volume  Final   Culture NO GROWTH < 24 HOURS  Final   Report Status PENDING  Incomplete  Culture, blood (routine x 2)     Status: None (Preliminary result)   Collection Time: 07/29/17  3:32 PM  Result Value Ref Range Status   Specimen Description BLOOD LEFT HAND  Final   Special Requests IN PEDIATRIC BOTTLE Blood Culture adequate volume  Final   Culture NO GROWTH < 24 HOURS  Final   Report Status PENDING   Incomplete         Radiology Studies: Ct Head W Contrast  Result Date: 07/28/2017 CLINICAL DATA:  Headaches and bacteremia EXAM: CT HEAD WITH CONTRAST TECHNIQUE: Contiguous axial images were obtained from the base of the skull through the vertex with intravenous contrast. CONTRAST:  6mL ISOVUE-300 IOPAMIDOL (ISOVUE-300) INJECTION 61% COMPARISON:  None. FINDINGS: Brain: No mass lesion, intraparenchymal hemorrhage or extra-axial collection. No evidence of acute cortical infarct. Brain parenchyma and CSF-containing spaces are normal for age. Vascular: No hyperdense vessel or unexpected calcification. Skull: Normal visualized skull base, calvarium and extracranial soft tissues. Sinuses/Orbits: No sinus fluid levels or advanced mucosal thickening. No mastoid effusion. Normal orbits. IMPRESSION: Normal head CT.  No abnormal enhancement. Electronically Signed   By: Deatra Robinson M.D.   On: 07/28/2017 19:30        Scheduled Meds: . acetaminophen  650 mg Oral TID  . buprenorphine-naloxone  1 tablet Sublingual BID  . folic acid  1 mg Oral Daily  . multivitamin with minerals  1 tablet Oral Daily  . pantoprazole  40 mg Oral Daily  . rifampin  300 mg Oral TID  . thiamine  100 mg Oral Daily   Continuous Infusions: . sodium chloride 20 mL/hr at 07/29/17 2302  .  ceFAZolin (ANCEF) IV Stopped (07/30/17 0713)  . gentamicin 80 mg (07/30/17 1034)     LOS: 4 days     Kendy Haston [name], MD [facility] Pager [phone]  If 7PM-7AM, please contact night-coverage [email or web address] Password [email or web address] 07/30/2017, 11:06 AM

## 2017-07-30 NOTE — Plan of Care (Signed)
Problem: Education: Goal: Knowledge of [facility] Education information/materials will improve Outcome: Progressing POC reviewed with pt.; NPO at [place]n for TEE.  Problem: Pain Managment: Goal: General experience of comfort will improve Outcome: Progressing Pain management discussed with pt.

## 2017-07-31 ENCOUNTER — Encounter (HOSPITAL_COMMUNITY): Payer: Self-pay | Admitting: Internal Medicine

## 2017-07-31 DIAGNOSIS — I2609 Other pulmonary embolism with acute cor pulmonale: Secondary | ICD-10-CM

## 2017-07-31 LAB — CBC WITH DIFFERENTIAL/PLATELET
BASOS ABS: 0 10*3/uL (ref 0.0–0.1)
BASOS PCT: 0 %
EOS PCT: 1 %
Eosinophils Absolute: 0.1 10*3/uL (ref 0.0–0.7)
HCT: 31.3 % — ABNORMAL LOW (ref 39.0–52.0)
Hemoglobin: 10.5 g/dL — ABNORMAL LOW (ref 13.0–17.0)
Lymphocytes Relative: 15 %
Lymphs Abs: 1.4 10*3/uL (ref 0.7–4.0)
MCH: 27.1 pg (ref 26.0–34.0)
MCHC: 33.5 g/dL (ref 30.0–36.0)
MCV: 80.9 fL (ref 78.0–100.0)
MONO ABS: 0.9 10*3/uL (ref 0.1–1.0)
Monocytes Relative: 10 %
Neutro Abs: 6.8 10*3/uL (ref 1.7–7.7)
Neutrophils Relative %: 74 %
PLATELETS: 342 10*3/uL (ref 150–400)
RBC: 3.87 MIL/uL — ABNORMAL LOW (ref 4.22–5.81)
RDW: 15.8 % — AB (ref 11.5–15.5)
WBC: 9.3 10*3/uL (ref 4.0–10.5)

## 2017-07-31 LAB — PROTIME-INR
INR: 1.17
PROTHROMBIN TIME: 15 s (ref 11.4–15.2)

## 2017-07-31 LAB — BASIC METABOLIC PANEL
ANION GAP: 8 (ref 5–15)
BUN: 15 mg/dL (ref 6–20)
CALCIUM: 8.4 mg/dL — AB (ref 8.9–10.3)
CO2: 24 mmol/L (ref 22–32)
Chloride: 100 mmol/L — ABNORMAL LOW (ref 101–111)
Creatinine, Ser: 1.02 mg/dL (ref 0.61–1.24)
GFR calc Af Amer: >60 mL/min (ref >60)
GLUCOSE: 137 mg/dL — AB (ref 65–99)
Potassium: 4.4 mmol/L (ref 3.5–5.1)
SODIUM: 132 mmol/L — AB (ref 135–145)

## 2017-07-31 LAB — GENTAMICIN LEVEL, TROUGH: GENTAMICIN TR: 1.8 ug/mL (ref 0.5–2.0)

## 2017-07-31 MED ORDER — ENOXAPARIN SODIUM 80 MG/0.8ML ~~LOC~~ SOLN
80.0000 mg | Freq: Two times a day (BID) | SUBCUTANEOUS | Status: DC
  Filled 2017-07-31: qty 0.8

## 2017-07-31 MED ORDER — RIVAROXABAN 15 MG PO TABS: 15.0000 mg | ORAL_TABLET | Freq: Two times a day (BID) | ORAL | Status: DC

## 2017-07-31 MED ORDER — GENTAMICIN IN SALINE 1.6-0.9 MG/ML-% IV SOLN
80.0000 mg | Freq: Two times a day (BID) | INTRAVENOUS | Status: DC
  Filled 2017-07-31: qty 50

## 2017-07-31 NOTE — Progress Notes (Signed)
Benefit check for Lovenox pending

## 2017-07-31 NOTE — Progress Notes (Signed)
Pt wants to leave AMA, called MD  MD returned page and is now talking to pt via phone

## 2017-07-31 NOTE — Progress Notes (Signed)
Patient provided w $1 copay card for Zyvox
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
## 2017-07-31 NOTE — Progress Notes (Signed)
  Date: 07/31/2017  Patient name: Norman Reed  Medical record number: [id number]  Date of birth: 1986-07-08   I am following up on [name] who is a 31 year old man with severe opioid use disorder complicated by MSSA endocarditis of a mechanical mitral valve. I started him on Suboxone on 8/14 for medication assisted therapy of opioid withdrawal. This week he has done very well at the 16 mg daily dose. Currently he denies any withdrawal symptoms, and says that his cravings are very well controlled. It looks like he is going to be discharged to home today. He is hopeful that he will be going to a facility for further medications, but it looks like by chart review that a facility placement is not an option for him. So it looks like he is going to be doing oral Linezolid and Rifampin at home. He tells me that he will go live with his parents in [place] for the time being. Eventually he wants to move back to [place]. He is very interested in pursuing treatment for his opioid use disorder. He feels very stable on current Suboxone dosing and would prefer to continue with this instead of transitioning to methadone. We talked about the various treatment options in [place] including [facility]. I think he is also a good candidate for our Suboxone clinic in the [facility]. He would prefer to follow up with [place].   I gave Chalmus a paper prescription for Suboxone 8 mg film was, take 2 films daily, dispense #24 for a 12 day supply. He is to follow up with [place] in the [facility] on Tuesday 8/28 at 8:15am. I gave him directions to our clinic and our phone number if he has any questions. He has insurance so filling the suboxone prescription should be fine.    [name], MD 07/31/2017, 3:06 PM

## 2017-07-31 NOTE — Plan of Care (Signed)
Problem: Pain Managment: Goal: General experience of comfort will improve Outcome: Progressing Monitored pain level and provided medication.   Reassessed pt afterward.  Pt is satisfied with pain level at this time.

## 2017-07-31 NOTE — Progress Notes (Signed)
PROGRESS NOTE    Norman Reed  BJY:[id number] DOB: 09-12-1986 DOA: 07/26/2017 PCP: No primary care provider on file.    Brief Narrative:  31 year old male who presented with MSSA endocarditis,transferred from [facility]. Patient is known to have chronic anemia, systolic heart failure, hepatitis C, history of mitral and tricuspid valve repair, and history of drug abuse.Patient was admitted on August 8th at the [facility], with a working diagnosis of endocarditis, apparently his mitral and tricuspid valve were involved, his ejection fraction was 25-30% of the left ventricle. Patient was transferred for further infection disease and cardiothoracic surgical evaluation. On initial physical examination blood pressure 100/46, pulse rate 94, respiratory rate 28, temperature 100, oxygen saturation 100%. He was acutely ill, mucous membranes were dry, lungs were clear to auscultation bilaterally, heart S1-S2 present, soft diastolic murmur, the abdomen was soft nontender, no lower extremity edema.  Patient was admitted to hospital with acute endocarditis.   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 Acute infectious endocarditis due to MSSA. Patient had TEE today, evaluation and follow up on valvular vegetation, mitral, tricuspid and possible aortic. Patient will need to continue IV antibiotic therapy, will plan to discharge to facility.   2. Systolic heart failure. Clinically euvolemic, will continue  to close monitoring of volume status, follow on echocardography.  3.Pulmonary embolism.  CT chest with pulmonary embolism and changes suggestive pulmonary hypertension, will start patient on therapeutic enoxaparin for anticoagulation, not able to to rivaroxaban due to interaction with rifampin.   4. Polysubstance abuse.  On Suboxone with good toletration. No agitation or confusion.   5. Chronic pain syndrome. Continue with  suboxone.    Late entry: Patient decided to leave the hospital AGAINST MEDICAL ADVICE. I personally talked to him about his decision  of leaving the hospital, he claims that he needs to take care of his belongings, and needs to leave the hospital immediately. I did offer help in terms of out-of-hospital arrangements, offered social services assistance, that he declined. I explain him in detail the severity of his infection and deep vein thrombosis with high risk of complication including death. He fully understands the consequences but he is adamant about leaving the hospital. I spent significant amount of time counseling patient about not leaving the hospital, importance of completing medical therapy, risk of death. He fully understands the consequences.   DVT prophylaxis:scd Code Status:full  Family Communication: Disposition Plan:   Consultants:  I.D  Cardiothoracic Surgery   Addiction medicine  Procedures:    Antimicrobials:   cefazolin  Gnetamicin  Rifampin  Subjective: Patient feeling better, chest pain has improved, no nausea or vomiting.   Objective: Vitals:   07/30/17 1420 07/30/17 2028 07/31/17 0015 07/31/17 0451  BP: (!) 94/40 (!) 113/56 102/64 113/61  Pulse:  90 73 83  Resp: 18 18 18 18   Temp: 98.7 F (37.1 C) 98 F (36.7 C) 98.6 F (37 C) 98.4 F (36.9 C)  TempSrc: Oral Oral Oral Oral  SpO2: 100% 100% 100% 100%  Weight:    78.9 kg (173 lb 14.4 oz)  Height:        Intake/Output Summary (Last 24 hours) at  07/31/17 1221 Last data filed at 07/31/17 0931  Gross per 24 hour  Intake           544.85 ml  Output              935 ml  Net          -390.15 ml   Filed Weights   07/29/17 1900 07/30/17 0521 07/31/17 0451  Weight: 80.3 kg (177 lb) 79 kg (174 lb 3.2 oz) 78.9 kg (173 lb 14.4 oz)    Examination:  General exam: not in pain or dyspnea E ENT. No pallor or icterus Respiratory system: Clear to auscultation. Respiratory effort normal. Cardiovascular system: S1 & S2 heard, RRR. No JVD, positive diastolic murmur at the right sternal border, rubs, gallops or clicks. No pedal edema. Gastrointestinal system: Abdomen is nondistended, soft and nontender. No organomegaly or masses felt. Normal bowel sounds heard. Central nervous system: Alert and oriented. No focal neurological deficits. Extremities: Symmetric 5 x 5 power. Skin: No rashes, lesions or ulcers     Data Reviewed: I have personally reviewed following labs and imaging studies  CBC:  Recent Labs Lab 07/27/17 0752 07/28/17 0450 07/29/17 0535 07/30/17 0450 07/31/17 0440  WBC 9.1 8.4 9.8 11.9* 9.3  NEUTROABS 5.4 5.2 6.7 8.3* 6.8  HGB 10.3* 10.2* 10.2* 11.5* 10.5*  HCT 29.8* 30.2* 30.6* 33.5* 31.3*  MCV 77.8* 78.4 79.1 79.6 80.9  PLT 123* 180 278 329 342   Basic Metabolic Panel:  Recent Labs Lab 07/26/17 2235 07/27/17 0752 07/28/17 0450 07/29/17 0535 07/30/17 0450 07/31/17 0440  NA 134* 133* 134* 135 133* 132*  K 3.6 3.7 3.8 4.2 4.4 4.4  CL 102 103 102 103 100* 100*  CO2 24 23 25 24 23 24   GLUCOSE 114* 114* 110* 105* 97 137*  BUN 9 9 9 11 11 15   CREATININE 0.93 1.00 0.94 0.97 0.96 1.02  CALCIUM 8.1* 7.9* 8.1* 8.2* 8.7* 8.4*  MG 1.5*  --   --   --   --   --    GFR: Estimated Creatinine Clearance: 117.3 mL/min (by C-G formula based on SCr of 1.02 mg/dL). Liver Function Tests:  Recent Labs Lab 07/26/17 2235  AST 21  ALT 12*  ALKPHOS 105  BILITOT 2.7*  PROT 6.2*  ALBUMIN 2.9*   No results for input(s):  LIPASE, AMYLASE in the last 168 hours. No results for input(s): AMMONIA in the last 168 hours. Coagulation Profile:  Recent Labs Lab 07/27/17 1738 07/28/17 0450 07/29/17 0535 07/30/17 0450 07/31/17 0440  INR 2.89 2.07 1.31 1.18 1.17   Cardiac Enzymes: No results for input(s): CKTOTAL, CKMB, CKMBINDEX, TROPONINI in the last 168 hours. BNP (last 3 results) No results for input(s): PROBNP in the last 8760 hours. HbA1C: No results for input(s): HGBA1C in the last 72 hours. CBG: No results for input(s): GLUCAP in the last 168 hours. Lipid Profile: No results for input(s): CHOL, HDL, LDLCALC, TRIG, CHOLHDL, LDLDIRECT in the last 72 hours. Thyroid Function Tests: No results for input(s): TSH, T4TOTAL, FREET4, T3FREE, THYROIDAB in the last 72 hours. Anemia Panel: No results for input(s): VITAMINB12, FOLATE, FERRITIN, TIBC, IRON, RETICCTPCT in the last 72 hours. Sepsis Labs: No results for input(s): PROCALCITON, LATICACIDVEN in the last 168 hours.  Recent Results (from the past 240 hour(s))  MRSA PCR Screening  Status: None   Collection Time: 07/26/17  9:51 PM  Result Value Ref Range Status   MRSA by PCR NEGATIVE NEGATIVE Final    Comment:        The GeneXpert MRSA Assay (FDA approved for NASAL specimens only), is one component of a comprehensive MRSA colonization surveillance program. It is not intended to diagnose MRSA infection nor to guide or monitor treatment for MRSA infections.   Culture, blood (routine x 2)     Status: None (Preliminary result)   Collection Time: 07/29/17  3:27 PM  Result Value Ref Range Status   Specimen Description BLOOD LEFT ARM  Final   Special Requests IN PEDIATRIC BOTTLE Blood Culture adequate volume  Final   Culture NO GROWTH < 24 HOURS  Final   Report Status PENDING  Incomplete  Culture, blood (routine x 2)     Status: None (Preliminary result)   Collection Time: 07/29/17  3:32 PM  Result Value Ref Range Status   Specimen Description  BLOOD LEFT HAND  Final   Special Requests IN PEDIATRIC BOTTLE Blood Culture adequate volume  Final   Culture NO GROWTH < 24 HOURS  Final   Report Status PENDING  Incomplete         Radiology Studies: Ct Angio Chest Pe W Or Wo Contrast  Result Date: 07/30/2017 CLINICAL DATA:  31 year old male with history of right-sided endocarditis. Evaluate for septic emboli. EXAM: CT ANGIOGRAPHY CHEST WITH CONTRAST TECHNIQUE: Multidetector CT imaging of the chest was performed using the standard protocol during bolus administration of intravenous contrast. Multiplanar CT image reconstructions and MIPs were obtained to evaluate the vascular anatomy. CONTRAST:  100 mL of Isovue 370. COMPARISON:  Chest CTA 07/28/2017. FINDINGS: Comment: Portions of the study are limited by considerable patient respiratory motion, most notable for the inferior aspect of the left lower lobe which is completely obscured by motion, which limits assessment for pulmonary embolism in affected areas. Cardiovascular: There is again abrupt termination of pulmonary artery branches to the right lower lobe, including lobar, segmental and subsegmental sized branches. The right lower lobe appears completely avascular at this time. In addition, there is termination of a segmental sized branch to the medial segment of the right middle lobe which appears completely occluded. These findings are unchanged compared to the recent prior examinations. Other pulmonary artery branches otherwise appear widely patent bilaterally, although branches in the inferior aspect of the left lower lobe cannot be adequately evaluated secondary to patient respiratory motion. Heart size is mildly enlarged. There is no significant pericardial fluid, thickening or pericardial calcification. Dilatation of the pulmonic trunk (3.7 cm in diameter). There is aortic atherosclerosis, as well as atherosclerosis of the great vessels of the mediastinum and the coronary arteries, including  calcified atherosclerotic plaque in the left anterior descending coronary artery. Status post tricuspid and mitral valve replacements. Epicardial pacing wires noted overlying the right atrium and right ventricle in the region of the right atrioventricular groove. Mediastinum/Nodes: Multiple borderline enlarged and mildly enlarged mediastinal and bilateral hilar lymph nodes measuring up to 1.4 cm in short axis in the right paratracheal nodal station, similar to the prior examination. Esophagus is unremarkable in appearance. No axillary lymphadenopathy. Lungs/Pleura: Extensive thickening of the peribronchovascular interstitium again noted throughout the right middle and lower lobes. Considerable volume loss is present within the right lower lobe, similar to the prior study. Small areas of subsegmental atelectasis are noted elsewhere throughout the lung bases, particularly in the left lower lobe. Trace bilateral pleural  effusions lying dependently. 9 mm nodular lesion in the periphery of the right upper lobe anteriorly (axial image 75 of series 6) is stable compared to the prior examination. Likewise, a pleural-based lesion near the apex of the left upper lobe (axial image 18 of series 6) measuring 1 cm also unchanged. 6 mm nodule in the periphery of the right upper lobe (axial image 55 of series 6) is new compared to the prior examination. No other new pulmonary nodules. Upper Abdomen: Mild diffuse low attenuation throughout the visualized hepatic parenchyma, indicative of hepatic steatosis. Musculoskeletal: Median sternotomy wires. There are no aggressive appearing lytic or blastic lesions noted in the visualized portions of the skeleton. Review of the MIP images confirms the above findings. IMPRESSION: 1. No definite acute pulmonary embolism identified. 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 2. Three small nodular areas are noted in the lungs bilaterally, one of which is new compared to the prior examination (6 mm nodule in the periphery of the right upper lobe). These are nonspecific, but favored to be infectious/inflammatory in etiology. The possibility of septic emboli is not excluded, but there are no distinguishing features of these to strongly indicate septic emboli at this time. 3. Trace bilateral pleural effusions are unchanged. 4. Dilatation of the pulmonic trunk (3.7 cm in diameter), concerning for pulmonary arterial hypertension. 5. Mild hepatic steatosis. 6. Aortic atherosclerosis, in addition to left anterior descending coronary artery disease. Please note that although the presence of coronary artery calcium documents the presence of coronary artery disease, the severity of this disease and any potential stenosis cannot be assessed on this non-gated CT examination. Assessment for potential risk factor modification, dietary therapy or pharmacologic therapy may be warranted, if clinically indicated. 7. Additional incidental findings, as above. Aortic Atherosclerosis (ICD10-I70.0). Electronically Signed   By: Trudie Reed M.D.   On: 07/30/2017 17:44        Scheduled Meds: . acetaminophen  650 mg Oral TID  . buprenorphine-naloxone  1 tablet Sublingual BID  . folic acid  1 mg Oral Daily  . multivitamin with minerals  1 tablet Oral Daily  . pantoprazole  40 mg Oral Daily  . rifampin  300 mg Oral TID  . thiamine  100 mg Oral Daily   Continuous Infusions: .  ceFAZolin (ANCEF) IV 2 g (07/31/17 0616)  . gentamicin Stopped (07/31/17 1152)     LOS: 5 days     Josslin Sanjuan [name], MD [facility] Pager [phone]  If 7PM-7AM, please contact night-coverage [email or web address] Password [facility] 07/31/2017, 12:21 PM

## 2017-07-31 NOTE — Progress Notes (Addendum)
Pt signed AMA form , witness Joni Reining, [place] MD aware Pt states he sent money to safe, informed pt that there was no form for belongings sent to safe Pt then states "my family took it home" Pt left AMA, pt has all belongings

## 2017-08-03 LAB — CULTURE, BLOOD (ROUTINE X 2)
CULTURE: NO GROWTH
CULTURE: NO GROWTH
SPECIAL REQUESTS: ADEQUATE
Special Requests: ADEQUATE

## 2017-08-04 MED ORDER — RIFAMPIN 300 MG PO CAPS: 300.0000 mg | ORAL_CAPSULE | Freq: Two times a day (BID) | ORAL | 0 refills | Status: DC

## 2017-08-04 MED ORDER — ENOXAPARIN SODIUM 40 MG/0.4ML ~~LOC~~ SOLN: 1.0000 mg/kg | Freq: Two times a day (BID) | SUBCUTANEOUS | 0 refills | Status: DC

## 2017-08-04 MED ORDER — LINEZOLID 600 MG PO TABS: 600.0000 mg | ORAL_TABLET | Freq: Two times a day (BID) | ORAL | 0 refills | Status: DC

## 2017-08-04 NOTE — Discharge Summary (Signed)
Physician Discharge Summary  Norman Reed VWU:[id number] DOB: [birth year] DOA: 07/26/2017  PCP: Norman Morton, MD  Admit date: 07/26/2017 Discharge date: 08/04/2017  Admitted From: Home Disposition:  Patient left against medical advice  Recommendations for Outpatient Follow-up:  Patient decided to leave the hospital AGAINST MEDICAL ADVICE. I personally talked to him about his decision  of leaving the hospital, he claims that he needs to take care of his belongings, and needs to leave the hospital immediately. I did offer help in terms of out-of-hospital arrangements, offered social services assistance, that he declined. I explain him in detail the severity of his infection and deep vein thrombosis with high risk of complication including death. He fully understands the consequences but he is adamant about leaving the hospital. I spent significant amount of time counseling patient about not leaving the hospital, importance of completing medical therapy, risk of death. He fully understands the consequences.    Brief/Interim Summary: 31 year old male who presented with MSSA endocarditis,transferred from [facility]. Patient is known to have chronic anemia, systolic heart failure, hepatitis C, history of mitral and tricuspid valve repair, and history of drug abuse.Patient was admitted on August 8th at the [facility], with a working diagnosis of endocarditis, apparently his mitral and tricuspid valve were involved, his ejection fraction was 25-30% of the left ventricle. Patient was transferred for further infection disease and cardiothoracic surgical evaluation. On initial physical examination blood pressure 100/46, pulse rate 94, respiratory rate 28, temperature 100, oxygen saturation 100%. He was acutely ill, mucous membranes were dry, lungs were clear to auscultation bilaterally, heart S1-S2 present, soft diastolic murmur, the abdomen was soft nontender, no lower extremity  edema.  Patient was admitted to hospital with acute endocarditis.  1. Acute infectious endocarditis due to MSSA. The patient was treated with IV antibiotics, he left AGAINST MEDICAL ADVICE before completing therapy, he had 11 days of therapy, going through the records after patient left the hospital, there is recommendations from infectious disease about oral antibiotics, I did call Norman Reed and his his mother Norman Reed, I explain them about the antibiotic therapy, patient is agreeable to take oral antibiotics, I did call the prescription in to the pharmacy of their choice, I spoke personally with the pharmacist to assure patient will get the medication. I also explained about the pulmonary embolism, need for anticoagulation. The patient informed me that he has used Lovenox in the past, as well as warfarin. I did call in a prescription for Lovenox therapeutic doses, I did explain him that he needs to follow-up with his primary care physician as soon as possible to start warfarin therapy.   I also relayed this information to his mother, who fully understands the instructions. I did gave them my personal phone number and the infection disease clinic number.   2. Pulmonary embolism. It is possible that this is related to septic emboli, cannot rule out thrombotic event, and an Lovenox has been prescribed as above.  3. Polysubstance abuse. Patient was advised to avoid illegal substances.  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) Active Problems:   Chronic systolic heart failure (HCC)   Anemia   Hypomagnesemia   Hyponatremia   Sepsis (HCC)   History of mitral valve replacement   History of tricuspid valve replacement   Thrombocytopenia (HCC)   Polysubstance abuse   Prosthetic valve endocarditis (HCC)   Obstruction of central line (HCC)   Staphylococcus aureus bacteremia with sepsis (HCC)  PICC line infection,  initial encounter    Discharge Instructions   Allergies as of 07/31/2017      Reactions   Penicillins Other (See Comments)   Childhood reaction      Medication List    ASK your doctor about these medications   acetaminophen 325 MG tablet Commonly known as:  TYLENOL Take 650 mg by mouth every 6 (six) hours as needed for mild pain or fever.   buPROPion 150 MG 12 hr tablet Commonly known as:  WELLBUTRIN SR Take 150 mg by mouth 2 (two) times daily.   carvedilol 6.25 MG tablet Commonly known as:  COREG Take 6.25 mg by mouth 2 (two) times daily with a meal.   gabapentin 300 MG capsule Commonly known as:  NEURONTIN Take 300 mg by mouth 3 (three) times daily.   hydrOXYzine 25 MG tablet Commonly known as:  ATARAX/VISTARIL Take 25 mg by mouth every 6 (six) hours as needed for anxiety.   LORazepam 1 MG tablet Commonly known as:  ATIVAN Take 1 mg by mouth every 4 (four) hours as needed for anxiety.   oxycodone 5 MG capsule Commonly known as:  OXY-IR Take 5 mg by mouth every 4 (four) hours as needed for pain.   warfarin 4 MG tablet Commonly known as:  COUMADIN Take 8 mg by mouth at bedtime.       Allergies  Allergen Reactions  . Penicillins Other (See Comments)    Childhood reaction    Consultations:     Procedures/Studies: Ct Head W Contrast  Result Date: 07/28/2017 CLINICAL DATA:  Headaches and bacteremia EXAM: CT HEAD WITH CONTRAST TECHNIQUE: Contiguous axial images were obtained from the base of the skull through the vertex with intravenous contrast. CONTRAST:  75mL ISOVUE-300 IOPAMIDOL (ISOVUE-300) INJECTION 61% COMPARISON:  None. FINDINGS: Brain: No mass lesion, intraparenchymal hemorrhage or extra-axial collection. No evidence of acute cortical infarct. Brain parenchyma and CSF-containing spaces are normal for age. Vascular: No hyperdense vessel or unexpected calcification. Skull: Normal visualized skull base, calvarium and extracranial soft tissues.  Sinuses/Orbits: No sinus fluid levels or advanced mucosal thickening. No mastoid effusion. Normal orbits. IMPRESSION: Normal head CT.  No abnormal enhancement. Electronically Signed   By: Deatra Robinson M.D.   On: 07/28/2017 19:30   Ct Angio Chest Pe W Or Wo Contrast  Result Date: 07/30/2017 CLINICAL DATA:  31 year old male with history of right-sided endocarditis. Evaluate for septic emboli. EXAM: CT ANGIOGRAPHY CHEST WITH CONTRAST TECHNIQUE: Multidetector CT imaging of the chest was performed using the standard protocol during bolus administration of intravenous contrast. Multiplanar CT image reconstructions and MIPs were obtained to evaluate the vascular anatomy. CONTRAST:  100 mL of Isovue 370. COMPARISON:  Chest CTA 07/28/2017. FINDINGS: Comment: Portions of the study are limited by considerable patient respiratory motion, most notable for the inferior aspect of the left lower lobe which is completely obscured by motion, which limits assessment for pulmonary embolism in affected areas. Cardiovascular: There is again abrupt termination of pulmonary artery branches to the right lower lobe, including lobar, segmental and subsegmental sized branches. The right lower lobe appears completely avascular at this time. In addition, there is termination of a segmental sized branch to the medial segment of the right middle lobe which appears completely occluded. These findings are unchanged compared to the recent prior examinations. Other pulmonary artery branches otherwise appear widely patent bilaterally, although branches in the inferior aspect of the left lower lobe cannot be adequately evaluated secondary to patient respiratory motion.  Heart size is mildly enlarged. There is no significant pericardial fluid, thickening or pericardial calcification. Dilatation of the pulmonic trunk (3.7 cm in diameter). There is aortic atherosclerosis, as well as atherosclerosis of the great vessels of the mediastinum and the  coronary arteries, including calcified atherosclerotic plaque in the left anterior descending coronary artery. Status post tricuspid and mitral valve replacements. Epicardial pacing wires noted overlying the right atrium and right ventricle in the region of the right atrioventricular groove. Mediastinum/Nodes: Multiple borderline enlarged and mildly enlarged mediastinal and bilateral hilar lymph nodes measuring up to 1.4 cm in short axis in the right paratracheal nodal station, similar to the prior examination. Esophagus is unremarkable in appearance. No axillary lymphadenopathy. Lungs/Pleura: Extensive thickening of the peribronchovascular interstitium again noted throughout the right middle and lower lobes. Considerable volume loss is present within the right lower lobe, similar to the prior study. Small areas of subsegmental atelectasis are noted elsewhere throughout the lung bases, particularly in the left lower lobe. Trace bilateral pleural effusions lying dependently. 9 mm nodular lesion in the periphery of the right upper lobe anteriorly (axial image 75 of series 6) is stable compared to the prior examination. Likewise, a pleural-based lesion near the apex of the left upper lobe (axial image 18 of series 6) measuring 1 cm also unchanged. 6 mm nodule in the periphery of the right upper lobe (axial image 55 of series 6) is new compared to the prior examination. No other new pulmonary nodules. Upper Abdomen: Mild diffuse low attenuation throughout the visualized hepatic parenchyma, indicative of hepatic steatosis. Musculoskeletal: Median sternotomy wires. There are no aggressive appearing lytic or blastic lesions noted in the visualized portions of the skeleton. Review of the MIP images confirms the above findings. IMPRESSION: 1. No definite acute pulmonary embolism identified. Occlusion of several right-sided pulmonary artery branches appears unchanged compared to recent prior examinations, including complete  occlusion of all branches to the right lower lobe. 2. Three small nodular areas are noted in the lungs bilaterally, one of which is new compared to the prior examination (6 mm nodule in the periphery of the right upper lobe). These are nonspecific, but favored to be infectious/inflammatory in etiology. The possibility of septic emboli is not excluded, but there are no distinguishing features of these to strongly indicate septic emboli at this time. 3. Trace bilateral pleural effusions are unchanged. 4. Dilatation of the pulmonic trunk (3.7 cm in diameter), concerning for pulmonary arterial hypertension. 5. Mild hepatic steatosis. 6. Aortic atherosclerosis, in addition to left anterior descending coronary artery disease. Please note that although the presence of coronary artery calcium documents the presence of coronary artery disease, the severity of this disease and any potential stenosis cannot be assessed on this non-gated CT examination. Assessment for potential risk factor modification, dietary therapy or pharmacologic therapy may be warranted, if clinically indicated. 7. Additional incidental findings, as above. Aortic Atherosclerosis (ICD10-I70.0). Electronically Signed   By: Trudie Reed M.D.   On: 07/30/2017 17:44   Ct Angio Chest Pe W Or Wo Contrast  Result Date: 07/28/2017 CLINICAL DATA:  History of IV drug abuse, valvular replacement, tachycardia and dyspnea, history of known pulmonary emboli EXAM: CT ANGIOGRAPHY CHEST WITH CONTRAST TECHNIQUE: Multidetector CT imaging of the chest was performed using the standard protocol during bolus administration of intravenous contrast. Multiplanar CT image reconstructions and MIPs were obtained to evaluate the vascular anatomy. CONTRAST:  100 mL Isovue 370 intravenous COMPARISON:  CT 08/08/ 2018 FINDINGS: Cardiovascular: Satisfactory opacification of the pulmonary  arteries to the segmental level. Non filling of right lower lobe, segmental and subsegmental  arterial branches, presumed occluded. Not much interval change compared to the prior CT. Under filling of subsegmental left lower lobe pulmonary arterial branches but suspect that this is related to excessive respiratory motion. Non aneurysmal aorta. Cardiomegaly. Small pericardial effusion. Mitral and tricuspid valve replacements. Central venous catheter tip in the SVC. Mediastinum/Nodes: Midline trachea. No thyroid mass. Increasing mediastinal adenopathy, 1.9 cm right paratracheal lymph node. Multiple enlarged paratracheal, AP window, and precarinal nodes. Esophagus within normal limits. Lungs/Pleura: Small bilateral pleural effusions, worsened in the interim. Increasing air space consolidation in the right middle and lower lobes compared to the prior CT. No pneumothorax. Upper Abdomen: No acute abnormality is seen Musculoskeletal: Post sternotomy changes. No acute or suspicious bone lesion Review of the MIP images confirms the above findings. IMPRESSION: 1. Nonfilling of right lower lobe, segmental and subsegmental pulmonary arterial branches which are presumably occluded, uncertain if this is acute or chronic but no interval change from recent comparison CT. No convincing evidence for new embolus. 2. Worsening bilateral pleural effusions. Worsening right middle lobe and right lower lobe airspace consolidation, possibly pneumonia 3. Increasing mediastinal adenopathy, could be reactive 4. Cardiomegaly with continued small pericardial effusion Electronically Signed   By: Jasmine Pang M.D.   On: 07/28/2017 02:02       Subjective:   Discharge Exam: Vitals:   07/31/17 0451 07/31/17 1106  BP: 113/61 110/62  Pulse: 83 80  Resp: 18 19  Temp: 98.4 F (36.9 C)   SpO2: 100% 98%   Vitals:   07/30/17 2028 07/31/17 0015 07/31/17 0451 07/31/17 1106  BP: (!) 113/56 102/64 113/61 110/62  Pulse: 90 73 83 80  Resp: 18 18 18 19   Temp: 98 F (36.7 C) 98.6 F (37 C) 98.4 F (36.9 C)   TempSrc: Oral Oral  Oral   SpO2: 100% 100% 100% 98%  Weight:   78.9 kg (173 lb 14.4 oz)   Height:        General: Pt is alert, awake, not in acute distress Cardiovascular: RRR, S1/S2 +, no rubs, no gallops Respiratory: CTA bilaterally, no wheezing, no rhonchi Abdominal: Soft, NT, ND, bowel sounds + Extremities: no edema, no cyanosis    The results of significant diagnostics from this hospitalization (including imaging, microbiology, ancillary and laboratory) are listed below for reference.     Microbiology: Recent Results (from the past 240 hour(s))  MRSA PCR Screening     Status: None   Collection Time: 07/26/17  9:51 PM  Result Value Ref Range Status   MRSA by PCR NEGATIVE NEGATIVE Final    Comment:        The GeneXpert MRSA Assay (FDA approved for NASAL specimens only), is one component of a comprehensive MRSA colonization surveillance program. It is not intended to diagnose MRSA infection nor to guide or monitor treatment for MRSA infections.   Culture, blood (routine x 2)     Status: None   Collection Time: 07/29/17  3:27 PM  Result Value Ref Range Status   Specimen Description BLOOD LEFT ARM  Final   Special Requests IN PEDIATRIC BOTTLE Blood Culture adequate volume  Final   Culture NO GROWTH 5 DAYS  Final   Report Status 08/03/2017 FINAL  Final  Culture, blood (routine x 2)     Status: None   Collection Time: 07/29/17  3:32 PM  Result Value Ref Range Status   Specimen Description BLOOD LEFT HAND  Final   Special Requests IN PEDIATRIC BOTTLE Blood Culture adequate volume  Final   Culture NO GROWTH 5 DAYS  Final   Report Status 08/03/2017 FINAL  Final     Labs: BNP (last 3 results) No results for input(s): BNP in the last 8760 hours. Basic Metabolic Panel:  Recent Labs Lab 07/29/17 0535 07/30/17 0450 07/31/17 0440  NA 135 133* 132*  K 4.2 4.4 4.4  CL 103 100* 100*  CO2 24 23 24   GLUCOSE 105* 97 137*  BUN 11 11 15   CREATININE 0.97 0.96 1.02  CALCIUM 8.2* 8.7* 8.4*    Liver Function Tests: No results for input(s): AST, ALT, ALKPHOS, BILITOT, PROT, ALBUMIN in the last 168 hours. No results for input(s): LIPASE, AMYLASE in the last 168 hours. No results for input(s): AMMONIA in the last 168 hours. CBC:  Recent Labs Lab 07/29/17 0535 07/30/17 0450 07/31/17 0440  WBC 9.8 11.9* 9.3  NEUTROABS 6.7 8.3* 6.8  HGB 10.2* 11.5* 10.5*  HCT 30.6* 33.5* 31.3*  MCV 79.1 79.6 80.9  PLT 278 329 342   Cardiac Enzymes: No results for input(s): CKTOTAL, CKMB, CKMBINDEX, TROPONINI in the last 168 hours. BNP: Invalid input(s): POCBNP CBG: No results for input(s): GLUCAP in the last 168 hours. D-Dimer No results for input(s): DDIMER in the last 72 hours. Hgb A1c No results for input(s): HGBA1C in the last 72 hours. Lipid Profile No results for input(s): CHOL, HDL, LDLCALC, TRIG, CHOLHDL, LDLDIRECT in the last 72 hours. Thyroid function studies No results for input(s): TSH, T4TOTAL, T3FREE, THYROIDAB in the last 72 hours.  Invalid input(s): FREET3 Anemia work up No results for input(s): VITAMINB12, FOLATE, FERRITIN, TIBC, IRON, RETICCTPCT in the last 72 hours. Urinalysis No results found for: COLORURINE, APPEARANCEUR, LABSPEC, PHURINE, GLUCOSEU, HGBUR, BILIRUBINUR, KETONESUR, PROTEINUR, UROBILINOGEN, NITRITE, LEUKOCYTESUR Sepsis Labs Invalid input(s): PROCALCITONIN,  WBC,  LACTICIDVEN Microbiology Recent Results (from the past 240 hour(s))  MRSA PCR Screening     Status: None   Collection Time: 07/26/17  9:51 PM  Result Value Ref Range Status   MRSA by PCR NEGATIVE NEGATIVE Final    Comment:        The GeneXpert MRSA Assay (FDA approved for NASAL specimens only), is one component of a comprehensive MRSA colonization surveillance program. It is not intended to diagnose MRSA infection nor to guide or monitor treatment for MRSA infections.   Culture, blood (routine x 2)     Status: None   Collection Time: 07/29/17  3:27 PM  Result Value Ref  Range Status   Specimen Description BLOOD LEFT ARM  Final   Special Requests IN PEDIATRIC BOTTLE Blood Culture adequate volume  Final   Culture NO GROWTH 5 DAYS  Final   Report Status 08/03/2017 FINAL  Final  Culture, blood (routine x 2)     Status: None   Collection Time: 07/29/17  3:32 PM  Result Value Ref Range Status   Specimen Description BLOOD LEFT HAND  Final   Special Requests IN PEDIATRIC BOTTLE Blood Culture adequate volume  Final   Culture NO GROWTH 5 DAYS  Final   Report Status 08/03/2017 FINAL  Final       SIGNED:   Coralie Keens, MD  [facility] 08/04/2017, 3:36 PM Pager [phone]  If 7PM-7AM, please contact night-coverage [email or web address] Password [email or web address]

## 2017-08-05 ENCOUNTER — Telehealth: Payer: Self-pay | Admitting: *Deleted

## 2017-08-05 NOTE — Telephone Encounter (Signed)
Prior Authorization  for Suboxone 8 mg/2 mg was sent to CoverMyMeds for review.  Approved 08/05/2017 thru 08/04/2018.  [facility] was called at [phone] and notified of approval Angelina Ok, RN 08/05/2017 11:15 AM.

## 2017-08-12 ENCOUNTER — Inpatient Hospital Stay (HOSPITAL_COMMUNITY)
Admission: EM | Admit: 2017-08-12 | Discharge: 2017-09-08 | DRG: 314 | Disposition: A | Discharge status: Home / Self Care | Payer: BLUE CROSS/BLUE SHIELD | Attending: Internal Medicine | Admitting: Internal Medicine

## 2017-08-12 ENCOUNTER — Emergency Department (HOSPITAL_COMMUNITY): Payer: BLUE CROSS/BLUE SHIELD

## 2017-08-12 ENCOUNTER — Encounter (HOSPITAL_COMMUNITY): Payer: Self-pay | Admitting: Emergency Medicine

## 2017-08-12 DIAGNOSIS — F17210 Nicotine dependence, cigarettes, uncomplicated: Secondary | ICD-10-CM | POA: Diagnosis present

## 2017-08-12 DIAGNOSIS — Z720 Tobacco use: Secondary | ICD-10-CM

## 2017-08-12 DIAGNOSIS — Z952 Presence of prosthetic heart valve: Secondary | ICD-10-CM | POA: Diagnosis not present

## 2017-08-12 DIAGNOSIS — T826XXS Infection and inflammatory reaction due to cardiac valve prosthesis, sequela: Secondary | ICD-10-CM | POA: Diagnosis not present

## 2017-08-12 DIAGNOSIS — Y838 Other surgical procedures as the cause of abnormal reaction of the patient, or of later complication, without mention of misadventure at the time of the procedure: Secondary | ICD-10-CM | POA: Diagnosis not present

## 2017-08-12 DIAGNOSIS — B1920 Unspecified viral hepatitis C without hepatic coma: Secondary | ICD-10-CM | POA: Diagnosis present

## 2017-08-12 DIAGNOSIS — Z7901 Long term (current) use of anticoagulants: Secondary | ICD-10-CM

## 2017-08-12 DIAGNOSIS — I330 Acute and subacute infective endocarditis: Secondary | ICD-10-CM | POA: Diagnosis present

## 2017-08-12 DIAGNOSIS — Z23 Encounter for immunization: Secondary | ICD-10-CM

## 2017-08-12 DIAGNOSIS — F1990 Other psychoactive substance use, unspecified, uncomplicated: Secondary | ICD-10-CM | POA: Diagnosis not present

## 2017-08-12 DIAGNOSIS — F1199 Opioid use, unspecified with unspecified opioid-induced disorder: Secondary | ICD-10-CM

## 2017-08-12 DIAGNOSIS — I2699 Other pulmonary embolism without acute cor pulmonale: Secondary | ICD-10-CM | POA: Diagnosis present

## 2017-08-12 DIAGNOSIS — B9561 Methicillin susceptible Staphylococcus aureus infection as the cause of diseases classified elsewhere: Secondary | ICD-10-CM | POA: Diagnosis present

## 2017-08-12 DIAGNOSIS — R197 Diarrhea, unspecified: Secondary | ICD-10-CM | POA: Diagnosis not present

## 2017-08-12 DIAGNOSIS — R791 Abnormal coagulation profile: Secondary | ICD-10-CM | POA: Diagnosis present

## 2017-08-12 DIAGNOSIS — T826XXD Infection and inflammatory reaction due to cardiac valve prosthesis, subsequent encounter: Secondary | ICD-10-CM | POA: Diagnosis not present

## 2017-08-12 DIAGNOSIS — D638 Anemia in other chronic diseases classified elsewhere: Secondary | ICD-10-CM | POA: Diagnosis present

## 2017-08-12 DIAGNOSIS — T826XXA Infection and inflammatory reaction due to cardiac valve prosthesis, initial encounter: Principal | ICD-10-CM | POA: Diagnosis present

## 2017-08-12 DIAGNOSIS — R509 Fever, unspecified: Secondary | ICD-10-CM | POA: Diagnosis present

## 2017-08-12 DIAGNOSIS — G894 Chronic pain syndrome: Secondary | ICD-10-CM | POA: Diagnosis not present

## 2017-08-12 DIAGNOSIS — I5022 Chronic systolic (congestive) heart failure: Secondary | ICD-10-CM | POA: Diagnosis present

## 2017-08-12 DIAGNOSIS — Y831 Surgical operation with implant of artificial internal device as the cause of abnormal reaction of the patient, or of later complication, without mention of misadventure at the time of the procedure: Secondary | ICD-10-CM | POA: Diagnosis present

## 2017-08-12 DIAGNOSIS — F1120 Opioid dependence, uncomplicated: Secondary | ICD-10-CM | POA: Diagnosis present

## 2017-08-12 DIAGNOSIS — I38 Endocarditis, valve unspecified: Secondary | ICD-10-CM | POA: Insufficient documentation

## 2017-08-12 DIAGNOSIS — Z954 Presence of other heart-valve replacement: Secondary | ICD-10-CM

## 2017-08-12 DIAGNOSIS — F1190 Opioid use, unspecified, uncomplicated: Secondary | ICD-10-CM

## 2017-08-12 HISTORY — DX: Headache: R51

## 2017-08-12 HISTORY — DX: Major depressive disorder, single episode, unspecified: F32.9

## 2017-08-12 HISTORY — DX: Acute myocardial infarction, unspecified: I21.9

## 2017-08-12 HISTORY — DX: Headache, unspecified: R51.9

## 2017-08-12 HISTORY — DX: Personal history of other diseases of the circulatory system: Z86.79

## 2017-08-12 HISTORY — DX: Anxiety disorder, unspecified: F41.9

## 2017-08-12 HISTORY — DX: Depression, unspecified: F32.A

## 2017-08-12 LAB — CBC WITH DIFFERENTIAL/PLATELET
BASOS PCT: 1 %
Basophils Absolute: 0.1 10*3/uL (ref 0.0–0.1)
Eosinophils Absolute: 0.1 10*3/uL (ref 0.0–0.7)
Eosinophils Relative: 2 %
HEMATOCRIT: 34.4 % — AB (ref 39.0–52.0)
HEMOGLOBIN: 10.8 g/dL — AB (ref 13.0–17.0)
LYMPHS ABS: 1.6 10*3/uL (ref 0.7–4.0)
LYMPHS PCT: 19 %
MCH: 26.3 pg (ref 26.0–34.0)
MCHC: 31.4 g/dL (ref 30.0–36.0)
MCV: 83.9 fL (ref 78.0–100.0)
MONOS PCT: 14 %
Monocytes Absolute: 1.1 10*3/uL — ABNORMAL HIGH (ref 0.1–1.0)
NEUTROS ABS: 5.5 10*3/uL (ref 1.7–7.7)
NEUTROS PCT: 64 %
Platelets: 190 10*3/uL (ref 150–400)
RBC: 4.1 MIL/uL — ABNORMAL LOW (ref 4.22–5.81)
RDW: 15.4 % (ref 11.5–15.5)
WBC: 8.4 10*3/uL (ref 4.0–10.5)

## 2017-08-12 LAB — URINALYSIS, ROUTINE W REFLEX MICROSCOPIC
Bilirubin Urine: NEGATIVE
GLUCOSE, UA: NEGATIVE mg/dL
Hgb urine dipstick: NEGATIVE
Ketones, ur: NEGATIVE mg/dL
LEUKOCYTES UA: NEGATIVE
Nitrite: NEGATIVE
PROTEIN: NEGATIVE mg/dL
SPECIFIC GRAVITY, URINE: 1.023 (ref 1.005–1.030)
pH: 5 (ref 5.0–8.0)

## 2017-08-12 LAB — COMPREHENSIVE METABOLIC PANEL
ALT: 21 U/L (ref 17–63)
AST: 23 U/L (ref 15–41)
Albumin: 3.5 g/dL (ref 3.5–5.0)
Alkaline Phosphatase: 84 U/L (ref 38–126)
Anion gap: 7 (ref 5–15)
BUN: 14 mg/dL (ref 6–20)
CHLORIDE: 98 mmol/L — AB (ref 101–111)
CO2: 29 mmol/L (ref 22–32)
CREATININE: 1.13 mg/dL (ref 0.61–1.24)
Calcium: 9.1 mg/dL (ref 8.9–10.3)
GFR calc Af Amer: >60 mL/min (ref >60)
Glucose, Bld: 95 mg/dL (ref 65–99)
Potassium: 4.5 mmol/L (ref 3.5–5.1)
SODIUM: 134 mmol/L — AB (ref 135–145)
Total Bilirubin: 0.8 mg/dL (ref 0.3–1.2)
Total Protein: 8.5 g/dL — ABNORMAL HIGH (ref 6.5–8.1)

## 2017-08-12 LAB — PROTIME-INR
INR: 1.92
Prothrombin Time: 21.8 seconds — ABNORMAL HIGH (ref 11.4–15.2)

## 2017-08-12 LAB — I-STAT CG4 LACTIC ACID, ED
Lactic Acid, Venous: 1.28 mmol/L (ref 0.5–1.9)
Lactic Acid, Venous: 1.42 mmol/L (ref 0.5–1.9)

## 2017-08-12 MED ORDER — BUPRENORPHINE HCL-NALOXONE HCL 8-2 MG SL SUBL: 2.0000 | SUBLINGUAL_TABLET | Freq: Every day | SUBLINGUAL | Status: DC

## 2017-08-12 MED ORDER — DIPHENHYDRAMINE HCL 25 MG PO CAPS
25.0000 mg | ORAL_CAPSULE | Freq: Four times a day (QID) | ORAL | Status: DC | PRN
  Administered 2017-08-12 – 2017-08-24 (×17): 25 mg via ORAL
  Filled 2017-08-12 (×17): qty 1

## 2017-08-12 MED ORDER — RIFAMPIN 300 MG PO CAPS
300.0000 mg | ORAL_CAPSULE | Freq: Three times a day (TID) | ORAL | Status: DC
  Administered 2017-08-12 – 2017-09-08 (×80): 300 mg via ORAL
  Filled 2017-08-12 (×82): qty 1

## 2017-08-12 MED ORDER — BUPRENORPHINE HCL-NALOXONE HCL 8-2 MG SL FILM: 2.0000 | ORAL_FILM | Freq: Every day | SUBLINGUAL | Status: DC

## 2017-08-12 MED ORDER — CARVEDILOL 6.25 MG PO TABS
6.2500 mg | ORAL_TABLET | Freq: Two times a day (BID) | ORAL | Status: DC
  Administered 2017-08-12 – 2017-08-26 (×28): 6.25 mg via ORAL
  Filled 2017-08-12 (×29): qty 1

## 2017-08-12 MED ORDER — ENOXAPARIN SODIUM 80 MG/0.8ML ~~LOC~~ SOLN
80.0000 mg | Freq: Two times a day (BID) | SUBCUTANEOUS | Status: DC
  Administered 2017-08-12 – 2017-08-15 (×6): 80 mg via SUBCUTANEOUS
  Filled 2017-08-12 (×6): qty 0.8

## 2017-08-12 MED ORDER — LORAZEPAM 0.5 MG PO TABS: 0.5000 mg | ORAL_TABLET | Freq: Four times a day (QID) | ORAL | Status: DC | PRN

## 2017-08-12 MED ORDER — WARFARIN - PHARMACIST DOSING INPATIENT: Freq: Every day | Status: DC

## 2017-08-12 MED ORDER — PNEUMOCOCCAL VAC POLYVALENT 25 MCG/0.5ML IJ INJ
0.5000 mL | INJECTION | INTRAMUSCULAR | Status: AC
  Administered 2017-08-13: 0.5 mL via INTRAMUSCULAR
  Filled 2017-08-12: qty 0.5

## 2017-08-12 MED ORDER — ZOLPIDEM TARTRATE 5 MG PO TABS
10.0000 mg | ORAL_TABLET | Freq: Every evening | ORAL | Status: DC | PRN
  Administered 2017-08-12 – 2017-09-07 (×27): 10 mg via ORAL
  Filled 2017-08-12 (×27): qty 2

## 2017-08-12 MED ORDER — WARFARIN SODIUM 5 MG PO TABS
10.0000 mg | ORAL_TABLET | Freq: Once | ORAL | Status: AC
  Administered 2017-08-12: 10 mg via ORAL
  Filled 2017-08-12: qty 2

## 2017-08-12 MED ORDER — BUPRENORPHINE HCL-NALOXONE HCL 8-2 MG SL SUBL
2.0000 | SUBLINGUAL_TABLET | Freq: Every day | SUBLINGUAL | Status: DC
  Administered 2017-08-12 – 2017-09-08 (×28): 2 via SUBLINGUAL
  Filled 2017-08-12 (×28): qty 2

## 2017-08-12 MED ORDER — GENTAMICIN IN SALINE 1.6-0.9 MG/ML-% IV SOLN
80.0000 mg | Freq: Two times a day (BID) | INTRAVENOUS | Status: AC
  Administered 2017-08-12 – 2017-08-21 (×19): 80 mg via INTRAVENOUS
  Filled 2017-08-12 (×19): qty 50

## 2017-08-12 MED ORDER — CEFAZOLIN SODIUM-DEXTROSE 2-4 GM/100ML-% IV SOLN
2.0000 g | Freq: Three times a day (TID) | INTRAVENOUS | Status: DC
  Administered 2017-08-12 – 2017-09-08 (×81): 2 g via INTRAVENOUS
  Filled 2017-08-12 (×86): qty 100

## 2017-08-12 MED ORDER — WARFARIN - PHARMACIST DOSING INPATIENT
Freq: Every day | Status: DC
  Administered 2017-08-13 – 2017-08-27 (×7)

## 2017-08-12 MED ORDER — ACETAMINOPHEN 325 MG PO TABS
650.0000 mg | ORAL_TABLET | Freq: Four times a day (QID) | ORAL | Status: DC | PRN
  Administered 2017-08-12 – 2017-09-07 (×12): 650 mg via ORAL
  Filled 2017-08-12 (×13): qty 2

## 2017-08-12 NOTE — ED Notes (Signed)
Lab states needs more blood for CMP

## 2017-08-12 NOTE — Progress Notes (Signed)
ANTICOAGULATION CONSULT NOTE - Initial Consult  Pharmacy Consult for warfarin  Indication: mechanical MVR/TVR  Vital Signs: Temp: 100.1 F (37.8 C) (08/29 2117) Temp Source: Oral (08/29 2117) BP: 117/53 (08/29 2117) Pulse Rate: 88 (08/29 2117)  Labs:  Recent Labs  08/12/17 1204 08/12/17 1320  HGB 10.8*  --   HCT 34.4*  --   PLT 190  --   LABPROT 21.8*  --   INR 1.92  --   CREATININE  --  1.13    Estimated Creatinine Clearance: 103.8 mL/min (by C-G formula based on SCr of 1.13 mg/dL).   Medical History: Past Medical History:  Diagnosis Date  . Anemia   . Anxiety   . Chronic systolic heart failure (HCC)   . Daily headache   . Depression   . Heart murmur   . Hepatitis C    "not treated for it yet" (08/12/2017)  . History of bacterial endocarditis    Hattie Perch 08/12/2017  . History of cardiac arrest ~ 01/2016   "in ER; 20 minutes after I was given MSO4; I'm not allergic to MSO4; might have gotten a little too much"  . IV drug abuse   . Myocardial infarction (HCC)    "X 2 a couple months ago; I ddidn't even know about them" (08/12/2017)   Assessment: 31 yo male admitted with fever, chills, malaise, and chest pain. Pharmacy consulted to continue PTA warfarin for history of mechanical valve. Patient has also been started on Lovenox treatment. Patient recently left AMA on 8/17 without completing abx treatment for endocarditis. Patient has recent CT indicating possible PE vs. septic embolus.   Of note: patient on PO rifampin. Pt w/ hx heparin bridge changed to Arixtra for drop in platelets.  INR today is subtherapeutic at 1.92. Hgb is slightly low at 10.8 and platelets wnl. No overt s/s bleeding noted.  Last dose PTA per patient was 8/28.  PTA dose : warfarin 8 mg daily   Goal of Therapy:  INR 2-3 per outpatient notes (need to confirm INR goal, if mech. mitral valve should be 2.5-3.5?)  Monitor platelets by anticoagulation protocol: Yes   Plan:  Give warfarin 10 mg po x  1 Continue Lovenox 80 mg subQ q 12 hours Monitor daily INR, CBC, clinical course, s/sx of bleed, PO intake, DDI    Thank you for allowing [place] to participate in this patients care.  Signe Colt, PharmD 10:27 PM 08/12/2017

## 2017-08-12 NOTE — ED Triage Notes (Signed)
Was seen her on 8 /12 sent home with antibiotics and  Cont to run a fever, has hx of heart surgery valve replacement x2 ? sepsis

## 2017-08-12 NOTE — Progress Notes (Signed)
Pharmacy Antibiotic Note  Norman Reed is a 31 y.o. male admitted on 08/12/2017 with bacteremia.  Pharmacy has been consulted for gentamicin and cefazolin dosing. PT was here with MSSA endocarditis and left AMA on 8/17 before completing therapy. Has returned with 1-2 nights of shakes and chills. Ideal body weight 77.6 kg  Gentamicin goal peak is 3-4 and goal trough < 1 8/17 trough 1.8  on gent 80 mg q 8 hours  Plan: Start Gentamicin 1 mg/kg (80 mg) IV Q 12 hours (synergy dosing based on previous admission) Start Cefazolin 2 gram IV Q 8 hours Continue Rifampin 300 mg PO TID Monitor trough at steady state   Height: 6' (182.9 cm) Weight: 167 lb 11.2 oz (76.1 kg) IBW/kg (Calculated) : 77.6  Temp (24hrs), Avg:98.4 F (36.9 C), Min:97.7 F (36.5 C), Max:99.1 F (37.3 C)   Recent Labs Lab 08/12/17 1204 08/12/17 1214 08/12/17 1320 08/12/17 1640  WBC 8.4  --   --   --   CREATININE  --   --  1.13  --   LATICACIDVEN  --  1.28  --  1.42    Estimated Creatinine Clearance: 103.8 mL/min (by C-G formula based on SCr of 1.13 mg/dL).    Allergies  Allergen Reactions  . Penicillins Other (See Comments)    Childhood reaction    Antimicrobials this admission: 8/29 gentamicin >>  8/29 Ancef >>  8/29 rifampin>>    Thank you for allowing [place] to participate in this patients care.  Signe Colt, PharmD Clinical phone for 08/12/2017 from 3:30-10:30p: [phone] If after 10:30p, please call main pharmacy at: [phone] 08/12/2017 6:39 PM

## 2017-08-12 NOTE — H&P (Signed)
History and Physical    Norman Reed ONG:[id number] DOB: [birth year] DOA: 08/12/2017  PCP: Bobbye Morton, MD  Patient coming from: Home.   I have personally briefly reviewed patient's old medical records in [facility]  Chief Complaint: fevers at home.   HPI: Norman Reed is a 31 y.o. male with medical history significant of MSSA endocarditis, chronic systolic heart failure, IV drug abuse, anemia, hepatitis C, was undergoing treatment for the MSSA endocarditis with IV Antibiotics until 8/17 , when he left AMA with oral rifampin and oral zyvox,. He is back in ED with his mother, for fever and chills and generalized body aches. He was referred to medical service for admission. He denies cough, congestion, chest pain, nasuea or vomiting, diarrhea or abdominal pain.  He denies any hematemesis or hematochezia, he denies using any illicit drugs at home. In ED, blood cultures were done. Labs were done, showed normal WBC count, hemoglobin of 10.8. UA is negative. CXR showed the following. Persistent small right pleural effusion with adjacent prominent interstitial thickening and patchy density within the right lower lobe, which could represent pneumonia.  Review of Systems: As per HPI otherwise 10 point review of systems negative.    Past Medical History:  Diagnosis Date  . Anemia   . Chronic systolic heart failure (HCC)   . Hepatitis C   . History of cardiac arrest   . IV drug abuse     Past Surgical History:  Procedure Laterality Date  . MITRAL VALVE REPAIR    . TEE WITHOUT CARDIOVERSION N/A 07/30/2017   Procedure: TRANSESOPHAGEAL ECHOCARDIOGRAM (TEE);  Surgeon: Dolores Patty, MD;  Location: [facility] ENDOSCOPY;  Service: Cardiovascular;  Laterality: N/A;  . TRICUSPID VALVE REPLACEMENT       reports that he has been smoking.  He has been smoking about 0.50 packs per day. He has never used smokeless tobacco. He reports that he uses drugs, including Methamphetamines and  Marijuana. He reports that he does not drink alcohol.  Allergies  Allergen Reactions  . Penicillins Other (See Comments)    Childhood reaction    Family History  Problem Relation Age of Onset  . Hyperlipidemia Maternal Grandmother   . Hyperlipidemia Paternal Grandfather    Reviewed.   Prior to Admission medications   Medication Sig Start Date End Date Taking? Authorizing Provider  acetaminophen (TYLENOL) 325 MG tablet Take 650 mg by mouth every 6 (six) hours as needed for mild pain or fever.   Yes [provider]  carvedilol (COREG) 6.25 MG tablet Take 6.25 mg by mouth 2 (two) times daily with a meal.   Yes [provider]  enoxaparin (LOVENOX) 80 MG/0.8ML injection Inject 0.8 mLs into the skin 2 (two) times daily. 08/04/17  Yes [provider]  linezolid (ZYVOX) 600 MG tablet Take 1 tablet (600 mg total) by mouth 2 (two) times daily. 08/04/17 09/15/17 Yes Arrien, York Ram, MD  lisinopril (PRINIVIL,ZESTRIL) 2.5 MG tablet Take 2.5 mg by mouth daily. 08/10/17  Yes [provider]  Multiple Vitamins-Minerals (MULTIVITAMIN WITH MINERALS) tablet Take 1 tablet by mouth daily.   Yes [provider]  Probiotic Product (PROBIOTIC PO) Take 1 tablet by mouth as needed (digestion). Taking while on antibiotics   Yes [provider]  rifampin (RIFADIN) 300 MG capsule Take 1 capsule (300 mg total) by mouth 2 (two) times daily. 08/04/17 09/15/17 Yes Arrien, York Ram, MD  SUBOXONE 8-2 MG FILM Place 2 Film under the tongue daily. 08/09/17  Yes [provider]  torsemide (DEMADEX) 10 MG tablet Take 10 mg by mouth daily. 08/10/17  Yes [provider]  warfarin (COUMADIN) 4 MG tablet Take 8 mg by mouth at bedtime.    Yes [provider]  enoxaparin (LOVENOX) 40 MG/0.4ML injection Inject 0.8 mLs (80 mg total) into the skin 2 (two) times daily. Patient not taking: Reported on 08/12/2017 08/04/17 08/19/17  Coralie Keens, MD    Physical Exam: Vitals:   08/12/17 1645 08/12/17 1700 08/12/17 1730 08/12/17 1801  BP: 115/63 114/68 117/66 (!) 117/55  Pulse: 87 85 86 93  Resp: 20 (!) 21 19 18   Temp:    99.1 F (37.3 C)  TempSrc:    Oral  SpO2: 100% 100% 100% 100%  Weight:    76.1 kg (167 lb 11.2 oz)  Height:    6' (1.829 m)    Constitutional: NAD, calm, comfortable Vitals:   08/12/17 1645 08/12/17 1700 08/12/17 1730 08/12/17 1801  BP: 115/63 114/68 117/66 (!) 117/55  Pulse: 87 85 86 93  Resp: 20 (!) 21 19 18   Temp:    99.1 F (37.3 C)  TempSrc:    Oral  SpO2: 100% 100% 100% 100%  Weight:    76.1 kg (167 lb 11.2 oz)  Height:    6' (1.829 m)   Eyes: PERRL, lids and conjunctivae normal ENMT: Mucous membranes are moist. Posterior pharynx clear of any exudate or lesions.Normal dentition.  Neck: normal, supple, no masses, no thyromegaly Respiratory: clear to auscultation bilaterally, no wheezing, no crackles. Normal respiratory effort. No accessory muscle use.  Cardiovascular: Regular rate and rhythm, murmer present. . No extremity edema. 2+ pedal pulses. No carotid bruits.  Abdomen: no tenderness, no masses palpated. No hepatosplenomegaly. Bowel sounds positive.  Musculoskeletal: no clubbing / cyanosis. No joint deformity upper and lower extremities. Good ROM, no contractures. Normal muscle tone.  Skin: multiple superficial wounds on the skin from scratching.  Neurologic: CN 2-12 grossly intact. Sensation intact, DTR normal. Strength 5/5 in all 4.  Psychiatric: Normal judgment and insight. Alert and oriented x 3. Normal mood.     Labs on Admission: I have personally reviewed following labs and imaging studies  CBC:  Recent Labs Lab 08/12/17 1204  WBC 8.4  NEUTROABS 5.5  HGB 10.8*  HCT 34.4*  MCV 83.9  PLT 190   Basic Metabolic Panel:  Recent Labs Lab 08/12/17 1320  NA 134*  K 4.5  CL 98*  CO2 29  GLUCOSE 95  BUN 14  CREATININE 1.13  CALCIUM 9.1   GFR: Estimated  Creatinine Clearance: 103.8 mL/min (by C-G formula based on SCr of 1.13 mg/dL). Liver Function Tests:  Recent Labs Lab 08/12/17 1320  AST 23  ALT 21  ALKPHOS 84  BILITOT 0.8  PROT 8.5*  ALBUMIN 3.5   No results for input(s): LIPASE, AMYLASE in the last 168 hours. No results for input(s): AMMONIA in the last 168 hours. Coagulation Profile:  Recent Labs Lab 08/12/17 1204  INR 1.92   Cardiac Enzymes: No results for input(s): CKTOTAL, CKMB, CKMBINDEX, TROPONINI in the last 168 hours. BNP (last 3 results) No results for input(s): PROBNP in the last 8760 hours. HbA1C: No results for input(s): HGBA1C in the last 72 hours. CBG: No results for input(s): GLUCAP in the last 168 hours. Lipid Profile: No results for input(s): CHOL, HDL, LDLCALC, TRIG, CHOLHDL, LDLDIRECT in the last 72 hours. Thyroid Function Tests: No results for input(s): TSH, T4TOTAL, FREET4, T3FREE,  THYROIDAB in the last 72 hours. Anemia Panel: No results for input(s): VITAMINB12, FOLATE, FERRITIN, TIBC, IRON, RETICCTPCT in the last 72 hours. Urine analysis:    Component Value Date/Time   COLORURINE AMBER (A) 08/12/2017 1215   APPEARANCEUR HAZY (A) 08/12/2017 1215   LABSPEC 1.023 08/12/2017 1215   PHURINE 5.0 08/12/2017 1215   GLUCOSEU NEGATIVE 08/12/2017 1215   HGBUR NEGATIVE 08/12/2017 1215   BILIRUBINUR NEGATIVE 08/12/2017 1215   KETONESUR NEGATIVE 08/12/2017 1215   PROTEINUR NEGATIVE 08/12/2017 1215   NITRITE NEGATIVE 08/12/2017 1215   LEUKOCYTESUR NEGATIVE 08/12/2017 1215    Radiological Exams on Admission: Dg Chest 2 View  Result Date: 08/12/2017 CLINICAL DATA:  Fever. EXAM: CHEST  2 VIEW COMPARISON:  CT chest dated July 30, 2017. FINDINGS: Mildly enlarged cardiomediastinal silhouette, unchanged. Prior tricuspid and mitral valve replacements. Epicardial pacing wires are again noted. Small right pleural effusion with adjacent right lower lobe interstitial thickening and patchy density. The left  lung is clear. No pneumothorax. No acute osseous abnormality. IMPRESSION: Persistent small right pleural effusion with adjacent prominent interstitial thickening and patchy density within the right lower lobe, which could represent pneumonia. Electronically Signed   By: Obie Dredge M.D.   On: 08/12/2017 12:41    EKG: Independently reviewed. NSR, incomplete RBBB  Assessment/Plan Active Problems:   Endocarditis   MSSA ENDOCARDITIS: Admitted for IV antibiotics , start ancef, gentamicin and rifampin.  Repeat blood cultures ordered and pending.  ID consulted for further recommendations.    Chronic systolic heart failure:  LVEFof 25% , a vegetation on the mechanical mitral valve seen on last TEE last admission.  Currently not in decompensated heart failure.  Resume coreg.     H/o MV , TV replacement:  Currently on lovenox and coumadin.  Sub therapeutic INR.       DVT prophylaxis: Lovenox and coumadin.  Code Status: FULL CODE.  Family Communication: discussed the plan of care with his mom at bedside.  Disposition Plan: pending.  Consults called: ID Dr Orvan Falconer Admission status:inpatient med Merton Border MD [facility] Pager [phone]  If 7PM-7AM, please contact night-coverage [email or web address] Password [facility]  08/12/2017, 7:18 PM

## 2017-08-12 NOTE — ED Notes (Signed)
Report attempted 

## 2017-08-12 NOTE — ED Notes (Signed)
ED Provider at bedside. 

## 2017-08-12 NOTE — ED Provider Notes (Signed)
[facility]-EMERGENCY DEPT Provider Note   CSN: [id number] Arrival date & time: 08/12/17  1122     History   Chief Complaint Chief Complaint  Patient presents with  . Fever    HPI Norman Reed is a 31 y.o. male. CC: Fever, recent endocarditis.  HPI:  31 year old male with a history of IV drug abuse. History of bacterial endocarditis. Seen at [facility] and underwent tricuspid, and mitral valve replacement in 2016. Relapsing started using IV drugs again. Admitted at [facility] August 8 transferred here August 12. Underwent consultation with CT surgery, ID, cardiology, and internal medicine.  Unfortunately left AGAINST MEDICAL ADVICE before completion of treatment on August 17. States he had to take care of his dog and do some other things. He is adamant that he has been clean since before his admission at [facility]. He is on Suboxone since being evaluated in the hospital here. He states he has not been using any illicit drugs at home.  He had fever shakes and chills again at least 1 night 2 nights ago. His mother has arrived to help him with his treatment, and some social issues. He states he would like to undergo completion of treatment.    Past Medical History:  Diagnosis Date  . Anemia   . Chronic systolic heart failure (HCC)   . Hepatitis C   . History of cardiac arrest   . IV drug abuse     Patient Active Problem List   Diagnosis Date Noted  . Endocarditis 08/12/2017  . PICC line infection, initial encounter   . Staphylococcus aureus bacteremia with sepsis (HCC)   . Chronic systolic heart failure (HCC) 07/27/2017  . Anemia 07/27/2017  . Hypomagnesemia 07/27/2017  . Hyponatremia 07/27/2017  . Sepsis (HCC) 07/27/2017  . History of mitral valve replacement 07/27/2017  . History of tricuspid valve replacement 07/27/2017  . Thrombocytopenia (HCC) 07/27/2017  . Polysubstance abuse 07/27/2017  . Prosthetic valve endocarditis (HCC)   . Obstruction of central line (HCC)   .  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 MITRAL VALVE REPAIR    . TEE WITHOUT CARDIOVERSION N/A 07/30/2017   Procedure: TRANSESOPHAGEAL ECHOCARDIOGRAM (TEE);  Surgeon: Dolores Patty, MD;  Location[facility] ENDOSCOPY;  Service: Cardiovascular;  Laterality: N/A;  . TRICUSPID VALVE REPLACEMENT         Home Medications    Prior to Admission medications   Medication Sig Start Date End Date Taking? Authorizing Provider  acetaminophen (TYLENOL) 325 MG tablet Take 650 mg by mouth every 6 (six) hours as needed for mild pain or fever.   Yes [provider]  carvedilol (COREG) 6.25 MG tablet Take 6.25 mg by mouth 2 (two) times daily with a meal.   Yes [provider]  enoxaparin (LOVENOX) 80 MG/0.8ML injection Inject 0.8 mLs into the skin 2 (two) times daily. 08/04/17  Yes [provider]  linezolid (ZYVOX) 600 MG tablet Take 1 tablet (600 mg total) by mouth 2 (two) times daily. 08/04/17 09/15/17 Yes Arrien, York Ram, MD  lisinopril (PRINIVIL,ZESTRIL) 2.5 MG tablet Take 2.5 mg by mouth daily. 08/10/17  Yes [provider]  Multiple Vitamins-Minerals (MULTIVITAMIN WITH MINERALS) tablet Take 1 tablet by mouth daily.   Yes [provider]  Probiotic Product (PROBIOTIC PO) Take 1 tablet by mouth as needed (digestion). Taking while on antibiotics   Yes [provider]  rifampin (RIFADIN) 300 MG capsule Take  1 capsule (300 mg total) by mouth 2 (two) times daily. 08/04/17 09/15/17 Yes Arrien, York Ram, MD  SUBOXONE 8-2 MG FILM Place 2 Film under the tongue daily. 08/09/17  Yes [provider]  torsemide (DEMADEX) 10 MG tablet Take 10 mg by mouth daily. 08/10/17  Yes [provider]  warfarin (COUMADIN) 4 MG tablet Take 8 mg by mouth at bedtime.    Yes [provider]  enoxaparin (LOVENOX) 40 MG/0.4ML injection Inject 0.8  mLs (80 mg total) into the skin 2 (two) times daily. Patient not taking: Reported on 08/12/2017 08/04/17 08/19/17  Arrien, York Ram, MD    Family History Family History  Problem Relation Age of Onset  . Hyperlipidemia Maternal Grandmother   . Hyperlipidemia Paternal Grandfather     Social History Social History  Substance Use Topics  . Smoking status: Current Every Day Smoker    Packs/day: 0.50  . Smokeless tobacco: Never Used  . Alcohol use No     Allergies   Penicillins   Review of Systems Review of Systems  Constitutional: Positive for chills, diaphoresis, fatigue and fever. Negative for appetite change.  HENT: Negative for mouth sores, sore throat and trouble swallowing.   Eyes: Negative for visual disturbance.  Respiratory: Negative for cough, chest tightness, shortness of breath and wheezing.   Cardiovascular: Negative for chest pain.  Gastrointestinal: Negative for abdominal distention, abdominal pain, diarrhea, nausea and vomiting.  Endocrine: Negative for polydipsia, polyphagia and polyuria.  Genitourinary: Negative for dysuria, frequency and hematuria.  Musculoskeletal: Negative for gait problem.  Skin: Negative for color change, pallor and rash.  Neurological: Negative for dizziness, syncope, light-headedness and headaches.  Hematological: Does not bruise/bleed easily.  Psychiatric/Behavioral: Negative for behavioral problems and confusion.     Physical Exam Updated Vital Signs BP 110/62   Pulse 86   Temp 97.7 F (36.5 C) (Oral)   Resp (!) 23   Ht 6' (1.829 m)   Wt 81.6 kg (180 lb)   SpO2 100%   BMI 24.41 kg/m   Physical Exam  Constitutional: He is oriented to person, place, and time. He appears well-developed and well-nourished. No distress.  HENT:  Head: Normocephalic.  Eyes: Pupils are equal, round, and reactive to light. Conjunctivae are normal. No scleral icterus.  Neck: Normal range of motion. Neck supple. No thyromegaly present.    Cardiovascular: Exam reveals no gallop and no friction rub.   No murmur heard. Systolic, and soft diastolic murmurs noted. Sinus rhythm. Regular.  Pulmonary/Chest: Effort normal and breath sounds normal. No respiratory distress. He has no wheezes. He has no rales.  Abdominal: Soft. Bowel sounds are normal. He exhibits no distension. There is no tenderness. There is no rebound.  Musculoskeletal: Normal range of motion.  Neurological: He is alert and oriented to person, place, and time.  Skin: Skin is warm and dry. No rash noted.  Psychiatric: He has a normal mood and affect. His behavior is normal.     ED Treatments / Results  Labs (all labs ordered are listed, but only abnormal results are displayed) Labs Reviewed  CBC WITH DIFFERENTIAL/PLATELET - Abnormal; Notable for the following:       Result Value   RBC 4.10 (*)    Hemoglobin 10.8 (*)    HCT 34.4 (*)    Monocytes Absolute 1.1 (*)    All other components within normal limits  PROTIME-INR - Abnormal; Notable for the following:    Prothrombin Time 21.8 (*)    All  other components within normal limits  URINALYSIS, ROUTINE W REFLEX MICROSCOPIC - Abnormal; Notable for the following:    Color, Urine AMBER (*)    APPearance HAZY (*)    All other components within normal limits  COMPREHENSIVE METABOLIC PANEL - Abnormal; Notable for the following:    Sodium 134 (*)    Chloride 98 (*)    Total Protein 8.5 (*)    All other components within normal limits  CULTURE, BLOOD (ROUTINE X 2)  CULTURE, BLOOD (ROUTINE X 2)  I-STAT CG4 LACTIC ACID, ED  I-STAT CG4 LACTIC ACID, ED    EKG  EKG Interpretation None       Radiology Dg Chest 2 View  Result Date: 08/12/2017 CLINICAL DATA:  Fever. EXAM: CHEST  2 VIEW COMPARISON:  CT chest dated July 30, 2017. FINDINGS: Mildly enlarged cardiomediastinal silhouette, unchanged. Prior tricuspid and mitral valve replacements. Epicardial pacing wires are again noted. Small right pleural  effusion with adjacent right lower lobe interstitial thickening and patchy density. The left lung is clear. No pneumothorax. No acute osseous abnormality. IMPRESSION: Persistent small right pleural effusion with adjacent prominent interstitial thickening and patchy density within the right lower lobe, which could represent pneumonia. Electronically Signed   By: Obie Dredge M.D.   On: 08/12/2017 12:41    Procedures Procedures (including critical care time)  Medications Ordered in ED Medications - No data to display   Initial Impression / Assessment and Plan / ED Course  I have reviewed the triage vital signs and the nursing notes.  Pertinent labs & imaging results that were available during my care of the patient were reviewed by me and considered in my medical decision making (see chart for details).    Recent treatment for endocarditis recurrent/persistent fevers. Patient is subtherapeutic on anticoagulation despite recent adjustment and Lovenox bridge. Is on day 12 of Cipro. Would recommend bridging anticoagulation. He has been recultured. We'll reconsult ID regarding at about treatment. His outpatient options may be limited as skilled nursing was difficult to find with his history of drug abuse in him requiring IV access such as PICC line. ID had previously made by mouth recommendations.  Final Clinical Impressions(s) / ED Diagnoses   Final diagnoses:  Acute bacterial endocarditis    New Prescriptions New Prescriptions   No medications on file     Rolland Porter, MD 08/12/17 1649

## 2017-08-13 DIAGNOSIS — Y838 Other surgical procedures as the cause of abnormal reaction of the patient, or of later complication, without mention of misadventure at the time of the procedure: Secondary | ICD-10-CM

## 2017-08-13 DIAGNOSIS — Z952 Presence of prosthetic heart valve: Secondary | ICD-10-CM

## 2017-08-13 DIAGNOSIS — T826XXA Infection and inflammatory reaction due to cardiac valve prosthesis, initial encounter: Principal | ICD-10-CM

## 2017-08-13 DIAGNOSIS — I330 Acute and subacute infective endocarditis: Secondary | ICD-10-CM

## 2017-08-13 DIAGNOSIS — F1190 Opioid use, unspecified, uncomplicated: Secondary | ICD-10-CM

## 2017-08-13 DIAGNOSIS — B9561 Methicillin susceptible Staphylococcus aureus infection as the cause of diseases classified elsewhere: Secondary | ICD-10-CM

## 2017-08-13 DIAGNOSIS — F1199 Opioid use, unspecified with unspecified opioid-induced disorder: Secondary | ICD-10-CM

## 2017-08-13 LAB — INFLUENZA PANEL BY PCR (TYPE A & B)
Influenza A By PCR: NEGATIVE
Influenza B By PCR: NEGATIVE

## 2017-08-13 LAB — PROTIME-INR
INR: 1.57
PROTHROMBIN TIME: 18.6 s — AB (ref 11.4–15.2)

## 2017-08-13 LAB — C-REACTIVE PROTEIN: CRP: 6 mg/dL — ABNORMAL HIGH (ref <1.0)

## 2017-08-13 LAB — SEDIMENTATION RATE: Sed Rate: 56 mm/hr — ABNORMAL HIGH (ref 0–16)

## 2017-08-13 MED ORDER — IBUPROFEN 400 MG PO TABS
200.0000 mg | ORAL_TABLET | Freq: Four times a day (QID) | ORAL | Status: DC | PRN
  Administered 2017-08-13: 200 mg via ORAL
  Filled 2017-08-13: qty 1

## 2017-08-13 MED ORDER — TRAMADOL HCL 50 MG PO TABS
50.0000 mg | ORAL_TABLET | Freq: Four times a day (QID) | ORAL | Status: DC | PRN
  Administered 2017-08-13: 50 mg via ORAL
  Filled 2017-08-13: qty 1

## 2017-08-13 MED ORDER — TRAMADOL HCL 50 MG PO TABS
100.0000 mg | ORAL_TABLET | Freq: Four times a day (QID) | ORAL | Status: DC | PRN
  Administered 2017-08-13 – 2017-08-28 (×10): 100 mg via ORAL
  Filled 2017-08-13 (×11): qty 2

## 2017-08-13 MED ORDER — WARFARIN SODIUM 7.5 MG PO TABS
12.5000 mg | ORAL_TABLET | Freq: Once | ORAL | Status: AC
  Administered 2017-08-13: 12.5 mg via ORAL
  Filled 2017-08-13: qty 1

## 2017-08-13 MED ORDER — NICOTINE 21 MG/24HR TD PT24
21.0000 mg | MEDICATED_PATCH | Freq: Every day | TRANSDERMAL | Status: DC
  Administered 2017-08-13 – 2017-09-08 (×27): 21 mg via TRANSDERMAL
  Filled 2017-08-13 (×28): qty 1

## 2017-08-13 NOTE — Progress Notes (Signed)
Upon presentation to the patient's room, Chaplain introduced self and inquired of any spiritual care support he needed. He stated at this time he didn't have any. Chaplain will follow up at a later time. Chaplain Janell Quiet [phone]

## 2017-08-13 NOTE — Progress Notes (Signed)
PROGRESS NOTE    Norman Reed  ZOX:[id number] DOB: September 21, 1986 DOA: 08/12/2017 PCP: Bobbye Morton, MD    Brief Narrative: Norman Reed is a 31 y.o. male with medical history significant of MSSA endocarditis, chronic systolic heart failure, IV drug abuse, anemia, hepatitis C, was undergoing treatment for the MSSA endocarditis with IV Antibiotics until 8/17 , when he left AMA with oral rifampin and oral zyvox,. He came back to ED, with complaints of persistent fevers and was admitted for IV antibiotics. ID consulted and recommendations given.   Assessment & Plan:   Principal Problem:   Prosthetic valve endocarditis (HCC) Active Problems:   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)   History of mitral valve replacement   History of tricuspid valve replacement   IVDU (intravenous drug user)    MSSA bacteremia and endocarditis:  Repeat blood cultures ordered yesterday and pending.  Resume the regimen with IV gentamicin, cefazolin and rifampin.  Pain control.    Tobacco abuse:  Nicotine patch.    H/o chronic systolic heart failure with h/o MV/TV replacement:  He appears euvolemic,  Resume lovenox and coumadin and INR subtherapeutic.  Resume coreg.   Mild normocytic anemia:  Hemoglobin stable around 10. Anemia probably from chronic disease.    DVT prophylaxis: (Lovenoxand coumadin) Code Status: full code.  Family Communication: none at bedside.  Disposition Plan: pending SW consult.    Consultants:   ID   Procedures: none.    Antimicrobials: gentamicin, cefazolin and rifampin.   Subjective: Body aches.   Objective: Vitals:   08/12/17 2117 08/12/17 2245 08/13/17 0016 08/13/17 0443  BP: (!) 117/53   (!) 112/56  Pulse: 88   90  Resp: 18   16  Temp: 100.1 F (37.8 C) (!) 101.4 F (38.6 C) 99.6 F (37.6 C) 99.7 F (37.6 C)  TempSrc: Oral  Oral Oral  SpO2: 100%   99%  Weight:      Height:        Intake/Output  Summary (Last 24 hours) at 08/13/17 1107 Last data filed at 08/13/17 0711  Gross per 24 hour  Intake              300 ml  Output             1150 ml  Net             -850 ml   Filed Weights   08/12/17 1528 08/12/17 1801  Weight: 81.6 kg (180 lb) 76.1 kg (167 lb 11.2 oz)    Examination:  General exam: Appears calm and comfortable  Respiratory system: Clear to auscultation. Respiratory effort normal. Cardiovascular system: S1 & S2 heard, RRR. No JVD, murmurs, rubs, gallops or clicks. No pedal edema. Gastrointestinal system: Abdomen is nondistended, soft and nontender. No organomegaly or masses felt. Normal bowel sounds heard. Central nervous system: Alert and oriented. No focal neurological deficits. Extremities: Symmetric 5 x 5 power. Skin: multiple skin excoriations from scratching.  Psychiatry: Judgement and insight appear normal. Mood & affect appropriate.     Data Reviewed: I have personally reviewed following labs and imaging studies  CBC:  Recent Labs Lab 08/12/17 1204  WBC 8.4  NEUTROABS 5.5  HGB 10.8*  HCT 34.4*  MCV 83.9  PLT 190   Basic Metabolic Panel:  Recent Labs Lab 08/12/17 1320  NA 134*  K 4.5  CL 98*  CO2 29  GLUCOSE 95  BUN 14  CREATININE 1.13  CALCIUM 9.1   GFR:  Estimated Creatinine Clearance: 103.8 mL/min (by C-G formula based on SCr of 1.13 mg/dL). Liver Function Tests:  Recent Labs Lab 08/12/17 1320  AST 23  ALT 21  ALKPHOS 84  BILITOT 0.8  PROT 8.5*  ALBUMIN 3.5   No results for input(s): LIPASE, AMYLASE in the last 168 hours. No results for input(s): AMMONIA in the last 168 hours. Coagulation Profile:  Recent Labs Lab 08/12/17 1204 08/13/17 0533  INR 1.92 1.57   Cardiac Enzymes: No results for input(s): CKTOTAL, CKMB, CKMBINDEX, TROPONINI in the last 168 hours. BNP (last 3 results) No results for input(s): PROBNP in the last 8760 hours. HbA1C: No results for input(s): HGBA1C in the last 72 hours. CBG: No  results for input(s): GLUCAP in the last 168 hours. Lipid Profile: No results for input(s): CHOL, HDL, LDLCALC, TRIG, CHOLHDL, LDLDIRECT in the last 72 hours. Thyroid Function Tests: No results for input(s): TSH, T4TOTAL, FREET4, T3FREE, THYROIDAB in the last 72 hours. Anemia Panel: No results for input(s): VITAMINB12, FOLATE, FERRITIN, TIBC, IRON, RETICCTPCT in the last 72 hours. Sepsis Labs:  Recent Labs Lab 08/12/17 1214 08/12/17 1640  LATICACIDVEN 1.28 1.42    No results found for this or any previous visit (from the past 240 hour(s)).       Radiology Studies: Dg Chest 2 View  Result Date: 08/12/2017 CLINICAL DATA:  Fever. EXAM: CHEST  2 VIEW COMPARISON:  CT chest dated July 30, 2017. FINDINGS: Mildly enlarged cardiomediastinal silhouette, unchanged. Prior tricuspid and mitral valve replacements. Epicardial pacing wires are again noted. Small right pleural effusion with adjacent right lower lobe interstitial thickening and patchy density. The left lung is clear. No pneumothorax. No acute osseous abnormality. IMPRESSION: Persistent small right pleural effusion with adjacent prominent interstitial thickening and patchy density within the right lower lobe, which could represent pneumonia. Electronically Signed   By: Obie Dredge M.D.   On: 08/12/2017 12:41        Scheduled Meds: . buprenorphine-naloxone  2 tablet Sublingual Daily  . carvedilol  6.25 mg Oral BID WC  . enoxaparin  80 mg Subcutaneous BID  . nicotine  21 mg Transdermal Daily  . rifampin  300 mg Oral Q8H  . Warfarin - Pharmacist Dosing Inpatient   Does not apply q1800   Continuous Infusions: .  ceFAZolin (ANCEF) IV Stopped (08/13/17 0535)  . gentamicin Stopped (08/13/17 0631)     LOS: 1 day    Time spent: 35 minutes.    Kathlen Mody, MD [facility] Pager [phone]  If 7PM-7AM, please contact night-coverage [email or web address] Password [email or web address] 08/13/2017, 11:07 AM

## 2017-08-13 NOTE — Progress Notes (Signed)
ANTICOAGULATION CONSULT NOTE - Initial Consult  Pharmacy Consult for warfarin  Indication: PE and history of afib  Vital Signs: Temp: 99.7 F (37.6 C) (08/30 0443) Temp Source: Oral (08/30 0443) BP: 112/56 (08/30 0443) Pulse Rate: 90 (08/30 0443)  Labs:  Recent Labs  08/12/17 1204 08/12/17 1320 08/13/17 0533  HGB 10.8*  --   --   HCT 34.4*  --   --   PLT 190  --   --   LABPROT 21.8*  --  18.6*  INR 1.92  --  1.57  CREATININE  --  1.13  --     Estimated Creatinine Clearance: 103.8 mL/min (by C-G formula based on SCr of 1.13 mg/dL).   Medical History: Past Medical History:  Diagnosis Date  . Anemia   . Anxiety   . Chronic systolic heart failure (HCC)   . Daily headache   . Depression   . Heart murmur   . Hepatitis C    "not treated for it yet" (08/12/2017)  . History of bacterial endocarditis    Hattie Perch 08/12/2017  . History of cardiac arrest ~ 01/2016   "in ER; 20 minutes after I was given MSO4; I'm not allergic to MSO4; might have gotten a little too much"  . IV drug abuse   . Myocardial infarction (HCC)    "X 2 a couple months ago; I ddidn't even know about them" (08/12/2017)   Assessment: 31 yo male admitted with fever, chills, malaise, and chest pain. Pharmacy consulted to continue PTA warfarin for PE and history of afib with a bioprosthetic mitral and tricuspid valve replacement. Patient has also been started on Lovenox treatment. Patient recently left AMA on 8/17 without completing abx treatment for endocarditis. Patient has recent CT indicating possible PE vs. septic embolus.   Of note: patient on PO rifampin. Pt w/ hx heparin bridge changed to Arixtra for drop in platelets.  INR today is subtherapeutic at 1.57. Hgb is slightly low at 10.8 and platelets wnl. No overt s/s bleeding noted.  Last dose PTA per patient was 8/28.  PTA dose : warfarin 8 mg daily   Goal of Therapy:  INR 2-3  Monitor platelets by anticoagulation protocol: Yes   Plan:  Give  warfarin 12.5 mg po x 1 Continue Lovenox 80 mg subQ q 12 hours Monitor daily INR, CBC, clinical course, s/sx of bleed, PO intake, DDI    Thank you for allowing [place] to participate in this patients care.  Malon Kindle, PharmD student  10:56 AM 08/13/2017

## 2017-08-13 NOTE — Progress Notes (Signed)
Patient ID: Norman Reed, male   DOB: 01/06/1986, 31 y.o.   MRN: [id number]          [facility] for Infectious Disease  Date of Admission:  08/12/2017   Total days of antibiotics 24         ASSESSMENT: He has MSSA prosthetic mitral valve endocarditis and was continuing to have fevers on outpatient oral linezolid and rifampin. He is back on the preferred, guideline based regimen of cefazolin, gentamicin and rifampin. He tells me that he is now willing to go to a skilled nursing facility to complete therapy. I will talk to him about this again tomorrow. I would hold off on PICC placement until we know blood cultures are negative and get more reassurance that he will actually go through with that plan this time.  PLAN: 1. Continue cefazolin, gentamicin and rifampin 2. 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 buprenorphine-naloxone  2 tablet Sublingual Daily  . carvedilol  6.25 mg Oral BID WC  . enoxaparin  80 mg Subcutaneous BID  . rifampin  300 mg Oral Q8H  . Warfarin - Pharmacist Dosing Inpatient   Does not apply q1800    SUBJECTIVE: Norman Reed as a history of active injecting drug use. He was admitted to [facility] 1 month ago and found to have MSSA bacteremia.He was transferred here where a TEE showed a small mobile vegetation on his mechanical mitral valve. He was treated with IV cefazolin, gentamicin and rifampin but left AGAINST MEDICAL ADVICE. He did fill prescriptions for oral linezolid and rifampin after he left hand has been taking that. He denies missing doses.He continued to have fever and generalized myalgias after discharge. He also said that "I just couldn't put all of that on my mother and father".  As a result he returned and was readmitted yesterday.  Review of Systems: Review of Systems  Constitutional: Positive for chills, diaphoresis, fever and malaise/fatigue.  Respiratory: Negative for cough, sputum production and shortness of breath.   Cardiovascular: Negative for chest pain.  Gastrointestinal: Negative for abdominal pain, diarrhea, nausea and vomiting.  Musculoskeletal: Positive for myalgias.  Neurological: Negative for sensory change, speech change, focal weakness and headaches.  Psychiatric/Behavioral: Positive for substance abuse.    Allergies  Allergen Reactions  . Penicillins Other (See Comments)    Childhood reaction    OBJECTIVE: Vitals:   08/12/17 2117 08/12/17 2245 08/13/17 0016 08/13/17 0443  BP: (!) 117/53   (!) 112/56  Pulse: 88   90  Resp: 18   16  Temp: 100.1 F (37.8 C) (!) 101.4 F (38.6 C) 99.6 F (37.6 C) 99.7 F (37.6 C)  TempSrc: Oral  Oral Oral  SpO2: 100%   99%  Weight:      Height:       Body mass index is 22.74 kg/m.  Physical Exam  Constitutional: He is oriented to person, place, and time.  He is resting quietly in bed. His mother is present.  Cardiovascular: Normal rate and regular rhythm.   No murmur heard. Pulmonary/Chest: Effort normal and breath sounds normal. He has no wheezes. He has no rales.  Abdominal: Soft. There is tenderness.  Musculoskeletal: Normal range of motion. He exhibits no edema or tenderness.  Neurological:  He is alert and oriented to person, place, and time.  Skin: No rash noted.  No splinter hemorrhages. He has some excoriations on both lower legs where he recently had some poison ivy.  Psychiatric:  Flat affect.    Lab Results Lab Results  Component Value Date   WBC 8.4 08/12/2017   HGB 10.8 (L) 08/12/2017   HCT 34.4 (L) 08/12/2017   MCV 83.9 08/12/2017   PLT 190 08/12/2017    Lab Results  Component Value Date   CREATININE 1.13 08/12/2017   BUN 14 08/12/2017   NA 134 (L) 08/12/2017   K  4.5 08/12/2017   CL 98 (L) 08/12/2017   CO2 29 08/12/2017    Lab Results  Component Value Date   ALT 21 08/12/2017   AST 23 08/12/2017   ALKPHOS 84 08/12/2017   BILITOT 0.8 08/12/2017     Microbiology: No results found for this or any previous visit (from the past 240 hour(s)).  [name], MD [facility] for Infectious Disease [facility] [phone] pager   [phone] cell 08/13/2017, 11:00 AM

## 2017-08-14 DIAGNOSIS — T826XXD Infection and inflammatory reaction due to cardiac valve prosthesis, subsequent encounter: Secondary | ICD-10-CM

## 2017-08-14 LAB — PROTIME-INR
INR: 2
Prothrombin Time: 22.5 s — ABNORMAL HIGH (ref 11.4–15.2)

## 2017-08-14 LAB — CBC
HEMATOCRIT: 36.6 % — AB (ref 39.0–52.0)
HEMOGLOBIN: 11.7 g/dL — AB (ref 13.0–17.0)
MCH: 26.6 pg (ref 26.0–34.0)
MCHC: 32 g/dL (ref 30.0–36.0)
MCV: 83.2 fL (ref 78.0–100.0)
Platelets: 205 10*3/uL (ref 150–400)
RBC: 4.4 MIL/uL (ref 4.22–5.81)
RDW: 15.1 % (ref 11.5–15.5)
WBC: 7.9 10*3/uL (ref 4.0–10.5)

## 2017-08-14 LAB — GENTAMICIN LEVEL, TROUGH: Gentamicin Trough: 3.1 ug/mL (ref 0.5–2.0)

## 2017-08-14 LAB — BASIC METABOLIC PANEL
ANION GAP: 9 (ref 5–15)
BUN: 11 mg/dL (ref 6–20)
CALCIUM: 9.1 mg/dL (ref 8.9–10.3)
CHLORIDE: 100 mmol/L — AB (ref 101–111)
CO2: 26 mmol/L (ref 22–32)
Creatinine, Ser: 0.95 mg/dL (ref 0.61–1.24)
GFR calc non Af Amer: >60 mL/min (ref >60)
GLUCOSE: 93 mg/dL (ref 65–99)
POTASSIUM: 4.4 mmol/L (ref 3.5–5.1)
Sodium: 135 mmol/L (ref 135–145)

## 2017-08-14 MED ORDER — WARFARIN SODIUM 5 MG PO TABS
10.0000 mg | ORAL_TABLET | Freq: Once | ORAL | Status: AC
  Administered 2017-08-14: 10 mg via ORAL
  Filled 2017-08-14: qty 2

## 2017-08-14 NOTE — Progress Notes (Signed)
Patient ID: Norman Reed, male   DOB: 1986/04/10, 31 y.o.   MRN: [id number]          [facility] for Infectious Disease  Date of Admission:  08/12/2017   Total days of antibiotics 25         ASSESSMENT: He has MSSA prosthetic mitral valve endocarditis and his treatment was altered after he left AMA. He says that he did take oral linezolid and rifampin after he left the hospital the first time. He says he is committed to going to a skilled nursing facility to complete therapy. His blood cultures are negative at 48 hours so I will ask for a PICC to be placed. I plan on treating him with 4 weeks of IV antibiotic therapy. He gets some credit for the first 11 days he was in the hospital on IV in the 14 day see what his home on oral antibiotics.  PLAN: 1. Continue cefazolin and rifampin for 25 more days 2. Continue gentamicin 7 more days 3. PICC placement 4. I will sign off now but arrange follow-up in my clinic  Diagnosis: Prosthetic mitral valve endocarditis  Culture Result: MSSA  Allergies  Allergen Reactions  . Penicillins Other (See Comments)    Childhood reaction    OPAT Orders Discharge antibiotics: Per pharmacy protocol Cefazolin, gentamicin and rifampin  Duration: Gentamicin for 1 more week Cefazolin and rifampin for 25 more days End Date: Gentamicin 08/21/2017 Cefazolin and rifampin 09/08/2017  PIC Care Per Protocol:  Labs weekly while on IV antibiotics: _x_ CBC with differential _x_ BMP __ CMP __ CRP __ ESR __ Vancomycin trough  _x_ Please pull PIC at completion of IV antibiotics __ Please leave PIC in place until doctor has seen patient or been notified  Fax weekly labs to [phone]  Clinic Follow Up Appt: I will arrange follow-up in my clinic on 09/03/2017     Principal Problem:   Prosthetic valve endocarditis ([place]) Active Problems:   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 buprenorphine-naloxone  2 tablet Sublingual Daily  . carvedilol  6.25 mg Oral BID WC  . enoxaparin  80 mg Subcutaneous BID  . nicotine  21 mg Transdermal Daily  . rifampin  300 mg Oral Q8H  . warfarin  10 mg Oral ONCE-1800  . Warfarin - Pharmacist Dosing Inpatient   Does not apply q1800    SUBJECTIVE: He is still having some generalized aching pain. He does not feel that this is due to drug withdrawal. He is also having some itching on his legs where he had recent poison ivy. Overall he is feeling better. He says that he is still committed to going to a skilled nursing facility to complete his therapy.  Review of Systems: Review of Systems  Constitutional: Positive for malaise/fatigue. Negative for chills, diaphoresis and fever.  Respiratory: Negative for cough, sputum production and shortness of breath.   Cardiovascular: Negative for chest pain.  Gastrointestinal: Negative for abdominal pain, diarrhea, nausea and vomiting.  Musculoskeletal: Positive for myalgias.  Skin: Positive for itching.  Neurological: Negative for sensory change, speech change, focal weakness and headaches.  Psychiatric/Behavioral: Positive for substance abuse.    Allergies  Allergen Reactions  . Penicillins Other (See Comments)    Childhood reaction    OBJECTIVE: Vitals:   08/13/17 1402 08/13/17 2223 08/14/17 0520 08/14/17 1441  BP: (!) 109/52 (!) 114/53 113/60 (!) 122/57  Pulse: 80 78 88 84  Resp: _0 Temp: 98 F (36.7 C) 98.6 F (37 C) 99.1 F (37.3 C) 98.9 F (37.2 C)  TempSrc: Oral Oral Oral Oral  SpO2: 100% 100% 100% 100%  Weight:      Height:       Body mass index is 22.74 kg/m.  Physical Exam  Constitutional: He is oriented to person, place, and time.  He is resting quietly in bed.   Cardiovascular: Normal rate and regular rhythm.   No murmur heard. Pulmonary/Chest: Effort normal and  breath sounds normal. He has no wheezes. He has no rales.  Abdominal: Soft. There is tenderness.  Musculoskeletal: Normal range of motion. He exhibits no edema or tenderness.  Neurological: He is alert and oriented to person, place, and time.  Skin: No rash noted.  No splinter hemorrhages. He has some excoriations on both lower legs where he recently had some poison ivy.  Psychiatric: Mood and affect normal.    Lab Results Lab Results  Component Value Date   WBC 7.9 08/14/2017   HGB 11.7 (L) 08/14/2017   HCT 36.6 (L) 08/14/2017   MCV 83.2 08/14/2017   PLT 205 08/14/2017    Lab Results  Component Value Date   CREATININE 0.95 08/14/2017   BUN 11 08/14/2017   NA 135 08/14/2017   K 4.4 08/14/2017   CL 100 (L) 08/14/2017   CO2 26 08/14/2017    Lab Results  Component Value Date   ALT 21 08/12/2017   AST 23 08/12/2017   ALKPHOS 84 08/12/2017   BILITOT 0.8 08/12/2017     Microbiology: Recent Results (from the past 240 hour(s))  Culture, blood (Routine x 2)     Status: None (Preliminary result)   Collection Time: 08/12/17 12:04 PM  Result Value Ref Range Status   Specimen Description BLOOD WRIST LEFT  Final   Special Requests IN PEDIATRIC BOTTLE Blood Culture adequate volume  Final   Culture NO GROWTH 2 DAYS  Final   Report Status PENDING  Incomplete  Culture, blood (Routine x 2)     Status: None (Preliminary result)   Collection Time: 08/12/17  4:30 PM  Result Value Ref Range Status   Specimen Description BLOOD RIGHT ANTECUBITAL  Final   Special Requests IN PEDIATRIC BOTTLE Blood Culture adequate volume  Final   Culture NO GROWTH 2 DAYS  Final   Report Status PENDING  Incomplete    Michel Bickers, MD [facility] [phone] pager   [phone] cell 08/14/2017, 4:25 PM

## 2017-08-14 NOTE — Progress Notes (Signed)
ANTICOAGULATION CONSULT NOTE   Pharmacy Consult for warfarin  Indication: Hx of PE and afib  Vital Signs: Temp: 99.1 F (37.3 C) (08/31 0520) Temp Source: Oral (08/31 0520) BP: 113/60 (08/31 0520) Pulse Rate: 88 (08/31 0520)  Labs:  Recent Labs  08/12/17 1204 08/12/17 1320 08/13/17 0533 08/14/17 0700 08/14/17 0942  HGB 10.8*  --   --   --  11.7*  HCT 34.4*  --   --   --  36.6*  PLT 190  --   --   --  205  LABPROT 21.8*  --  18.6* 22.5*  --   INR 1.92  --  1.57 2.00  --   CREATININE  --  1.13  --   --  0.95    Estimated Creatinine Clearance: 123.5 mL/min (by C-G formula based on SCr of 0.95 mg/dL).   Medical History: Past Medical History:  Diagnosis Date  . Anemia   . Anxiety   . Chronic systolic heart failure (HCC)   . Daily headache   . Depression   . Heart murmur   . Hepatitis C    "not treated for it yet" (08/12/2017)  . History of bacterial endocarditis    Norman Reed 08/12/2017  . History of cardiac arrest ~ 01/2016   "in ER; 20 minutes after I was given MSO4; I'm not allergic to MSO4; might have gotten a little too much"  . IV drug abuse   . Myocardial infarction (HCC)    "X 2 a couple months ago; I ddidn't even know about them" (08/12/2017)   Assessment: 31 yo male admitted with MSSA prosthetic mitral valve endocarditis. Pharmacy consulted to continue PTA warfarin for PE and history of afib with a bioprosthetic mitral and tricuspid valve replacement. Started on Lovenox on admission due to subtherapeutic INR.  INR is therapeutic at 2 today. CBC stable with no reported bleeding.   PTA dose : warfarin 8 mg daily   Goal of Therapy:  INR 2-3  Monitor platelets by anticoagulation protocol: Yes   Plan:  -Warfarin 10 mg po x 1 this evening  -Continue Lovenox 80 mg subQ q 12 hours for now since pt is on rifampin too. If INR therapeutic on 9/1 will plan to stop lovenox and continue with warfarin only -Monitor daily INR, CBC, clinical course, s/sx of bleed, PO  intake, DDI    Thank you for allowing [place] to participate in this patients care.  Pollyann Samples, PharmD, BCPS 08/14/2017, 12:47 PM

## 2017-08-14 NOTE — Progress Notes (Signed)
Pharmacy Antibiotic Note Norman Reed is a 31 y.o. male admitted on 08/12/2017 with MSSA prosthetic mitral valve endocarditis in setting of IVDU. Currently pt is receiving cefazolin, gentamycin and rifampin for treatment.  Gentamycin trough of 3.1 from this morning is erroneous as was drawn during am infusion of gentamycin. SCr stable.   Plan: 1. Redraw gentamycin trough this evening prior to 19:00 dose; goal trough < 1  2. Continue cefazolin 2 grams IV every 8 hours 3. Continue Rifampin 300 mg TID  Height: 6' (182.9 cm) Weight: 167 lb 11.2 oz (76.1 kg) IBW/kg (Calculated) : 77.6  Temp (24hrs), Avg:98.6 F (37 C), Min:98 F (36.7 C), Max:99.1 F (37.3 C)   Recent Labs Lab 08/12/17 1204 08/12/17 1214 08/12/17 1320 08/12/17 1640 08/14/17 0700 08/14/17 0942  WBC 8.4  --   --   --   --  7.9  CREATININE  --   --  1.13  --   --  0.95  LATICACIDVEN  --  1.28  --  1.42  --   --   GENTTROUGH  --   --   --   --  3.1*  --     Estimated Creatinine Clearance: 123.5 mL/min (by C-G formula based on SCr of 0.95 mg/dL).    Allergies  Allergen Reactions  . Penicillins Other (See Comments)    Childhood reaction    Antimicrobials this admission: 8/29 gentamicin >>  8/29 cefazolin  >>  8/29 rifampin >>   Microbiology results: 8/29 BCx: px  Thank you for allowing pharmacy to be a part of this patient's care.  Pollyann Samples, PharmD, BCPS 08/14/2017, 12:34 PM

## 2017-08-14 NOTE — NC FL2 (Signed)
[place] MEDICAID FL2 LEVEL OF CARE SCREENING TOOL     IDENTIFICATION  Patient Name: Norman Reed Birthdate: 1986/07/04 Sex: male Admission Date (Current Location): 08/12/2017  [facility] and [place] Number:  [employer] and Address:  [facility] [place]. [facility], [address], [place], [place] [address]      Provider Number: [id number]  Attending Physician Name and Address:  Kathlen Mody, MD  Relative Name and Phone Number:  Olegario Messier, mother, [phone]    Current Level of Care: Hospital Recommended Level of Care: Skilled Nursing Facility Prior Approval Number:    Date Approved/Denied:   PASRR Number: [id number]  Discharge Plan: SNF    Current Diagnoses: Patient Active Problem List   Diagnosis Date Noted  . IVDU (intravenous drug user) 08/13/2017  . Chronic systolic heart failure (HCC) 07/27/2017  . Anemia 07/27/2017  . History of mitral valve replacement 07/27/2017  . History of tricuspid valve replacement 07/27/2017  . Thrombocytopenia (HCC) 07/27/2017  . Polysubstance abuse 07/27/2017  . Prosthetic valve endocarditis (HCC)   . 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) 07/26/2017    Orientation RESPIRATION BLADDER Height & Weight     Self, Time, Situation, Place  Normal Continent Weight: 76.1 kg (167 lb 11.2 oz) Height:  6' (182.9 cm)  BEHAVIORAL SYMPTOMS/MOOD NEUROLOGICAL BOWEL NUTRITION STATUS      Continent Diet (Please see DC Summary)  AMBULATORY STATUS COMMUNICATION OF NEEDS Skin   Independent Verbally Normal                       Personal Care Assistance Level of Assistance  Bathing, Feeding, Dressing Bathing Assistance: Independent Feeding assistance: Independent Dressing Assistance: Independent     Functional Limitations Info             SPECIAL CARE FACTORS FREQUENCY   (PICC line for IV abx)                    Contractures      Additional Factors Info  Code Status,  Allergies Code Status Info: Full Allergies Info: Penicillins           Current Medications (08/14/2017):  This is the current hospital active medication list Current Facility-Administered Medications  Medication Dose Route Frequency Provider Last Rate Last Dose  . acetaminophen (TYLENOL) tablet 650 mg  650 mg Oral Q6H PRN Briscoe Deutscher, MD   650 mg at 08/13/17 0953  . buprenorphine-naloxone (SUBOXONE) 8-2 mg per SL tablet 2 tablet  2 tablet Sublingual Daily Kathlen Mody, MD   2 tablet at 08/13/17 0951  . carvedilol (COREG) tablet 6.25 mg  6.25 mg Oral BID WC Kathlen Mody, MD   6.25 mg at 08/14/17 0801  . ceFAZolin (ANCEF) IVPB 2g/100 mL premix  2 g Intravenous Q8H Kathlen Mody, MD   Stopped at 08/14/17 0542  . diphenhydrAMINE (BENADRYL) capsule 25 mg  25 mg Oral Q6H PRN Opyd, Lavone Neri, MD   25 mg at 08/13/17 1825  . enoxaparin (LOVENOX) injection 80 mg  80 mg Subcutaneous BID Kathlen Mody, MD   80 mg at 08/13/17 2225  . gentamicin (GARAMYCIN) IVPB 80 mg  80 mg Intravenous Q12H Kathlen Mody, MD   Stopped at 08/14/17 [phone]  . ibuprofen (ADVIL,MOTRIN) tablet 200 mg  200 mg Oral Q6H PRN Kathlen Mody, MD   200 mg at 08/13/17 1653  . LORazepam (ATIVAN) tablet 0.5-1 mg  0.5-1 mg Oral Q6H PRN Opyd, Lavone Neri,  MD      . nicotine (NICODERM CQ - dosed in mg/24 hours) patch 21 mg  21 mg Transdermal Daily Kathlen Mody, MD   21 mg at 08/13/17 1208  . rifampin (RIFADIN) capsule 300 mg  300 mg Oral Q8H Kathlen Mody, MD   300 mg at 08/14/17 0512  . traMADol (ULTRAM) tablet 100 mg  100 mg Oral Q6H PRN Kathlen Mody, MD   100 mg at 08/13/17 1819  . Warfarin - Pharmacist Dosing Inpatient   Does not apply [id number] Kathlen Mody, MD      . zolpidem (AMBIEN) tablet 10 mg  10 mg Oral QHS PRN Opyd, Lavone Neri, MD   10 mg at 08/13/17 2225     Discharge Medications: Please see discharge summary for a list of discharge medications.  Relevant Imaging Results:  Relevant Lab Results:   Additional  Information SSN: [id number] [place], [place]

## 2017-08-14 NOTE — Progress Notes (Signed)
PROGRESS NOTE    Norman Reed  ESP:[id number] DOB: [birth year] DOA: 08/12/2017 PCP: Bobbye Morton, MD    Brief Narrative: Norman Reed is a 31 y.o. male with medical history significant of MSSA endocarditis, chronic systolic heart failure, IV drug abuse, anemia, hepatitis C, was undergoing treatment for the MSSA endocarditis with IV Antibiotics until 8/17 , when he left AMA with oral rifampin and oral zyvox,. He came back to ED, with complaints of persistent fevers and was admitted for IV antibiotics. ID consulted and recommendations given.   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  Resume the regimen with IV gentamicin, cefazolin and rifampin.  Pain control.  ID consulted and recommendations given.     Tobacco abuse:  Nicotine patch.    H/o chronic systolic heart failure with h/o MV/TV replacement:  He appears euvolemic,  Resume lovenox and coumadin and INR subtherapeutic at 2.  Resume coreg.   Mild normocytic anemia:  Hemoglobin stable around 10. Anemia probably from chronic disease.    DVT prophylaxis: (Lovenoxand coumadin) Code Status: full code.  Family Communication: none at bedside. Discussed with mom the plan of care.  Disposition Plan: pending SW consult.    Consultants:   ID   Procedures: none.    Antimicrobials: gentamicin, cefazolin and rifampin.since admission.    Subjective: Chills and aches improved. Reports feeling better. Afebrile over night.    Objective: Vitals:   08/13/17 0443 08/13/17 1402 08/13/17 2223 08/14/17 0520  BP: (!) 112/56 (!) 109/52 (!) 114/53 113/60  Pulse: 90 80 78  88  Resp: 16 18 18    Temp: 99.7 F (37.6 C) 98 F (36.7 C) 98.6 F (37 C) 99.1 F (37.3 C)  TempSrc: Oral Oral Oral Oral  SpO2: 99% 100% 100% 100%  Weight:      Height:        Intake/Output Summary (Last 24 hours) at 08/14/17 0818 Last data filed at 08/14/17 0647  Gross per 24 hour  Intake              470 ml  Output             1175 ml  Net             -705 ml   Filed Weights   08/12/17 1528 08/12/17 1801  Weight: 81.6 kg (180 lb) 76.1 kg (167 lb 11.2 oz)    Examination:  General exam: Appears calm and comfortable not in any distress.  Respiratory system: clear to auscultation, no wheezing or rhonchi.  Cardiovascular system: S1 & S2 heard, RRR. No JVD, murmurs, rubs, gallops or clicks. No pedal edema. Gastrointestinal system: Abdomen is soft non tender non distended bowel sounds heard.  Central nervous system: Alert and oriented. No focal neurological deficits. Extremities: Symmetric 5 x 5 power. No pedal edema.  Skin: multiple skin excoriations from scratching.  Psychiatry: Judgement and insight appear normal. Mood & affect appropriate.     Data Reviewed: I have personally reviewed following labs and imaging studies  CBC:  Recent Labs Lab 08/12/17 1204  WBC 8.4  NEUTROABS 5.5  HGB 10.8*  HCT 34.4*  MCV 83.9  PLT 190  Basic Metabolic Panel:  Recent Labs Lab 08/12/17 1320  NA 134*  K 4.5  CL 98*  CO2 29  GLUCOSE 95  BUN 14  CREATININE 1.13  CALCIUM 9.1   GFR: Estimated Creatinine Clearance: 103.8 mL/min (by C-G formula based on SCr of 1.13 mg/dL). Liver Function Tests:  Recent Labs Lab 08/12/17 1320  AST 23  ALT 21  ALKPHOS 84  BILITOT 0.8  PROT 8.5*  ALBUMIN 3.5   No results for input(s): LIPASE, AMYLASE in the last 168 hours. No results for input(s): AMMONIA in the last 168 hours. Coagulation Profile:  Recent Labs Lab 08/12/17 1204 08/13/17 0533 08/14/17 0700  INR 1.92 1.57 2.00   Cardiac Enzymes: No results for input(s):  CKTOTAL, CKMB, CKMBINDEX, TROPONINI in the last 168 hours. BNP (last 3 results) No results for input(s): PROBNP in the last 8760 hours. HbA1C: No results for input(s): HGBA1C in the last 72 hours. CBG: No results for input(s): GLUCAP in the last 168 hours. Lipid Profile: No results for input(s): CHOL, HDL, LDLCALC, TRIG, CHOLHDL, LDLDIRECT in the last 72 hours. Thyroid Function Tests: No results for input(s): TSH, T4TOTAL, FREET4, T3FREE, THYROIDAB in the last 72 hours. Anemia Panel: No results for input(s): VITAMINB12, FOLATE, FERRITIN, TIBC, IRON, RETICCTPCT in the last 72 hours. Sepsis Labs:  Recent Labs Lab 08/12/17 1214 08/12/17 1640  LATICACIDVEN 1.28 1.42    Recent Results (from the past 240 hour(s))  Culture, blood (Routine x 2)     Status: None (Preliminary result)   Collection Time: 08/12/17 12:04 PM  Result Value Ref Range Status   Specimen Description BLOOD WRIST LEFT  Final   Special Requests IN PEDIATRIC BOTTLE Blood Culture adequate volume  Final   Culture NO GROWTH < 24 HOURS  Final   Report Status PENDING  Incomplete  Culture, blood (Routine x 2)     Status: None (Preliminary result)   Collection Time: 08/12/17  4:30 PM  Result Value Ref Range Status   Specimen Description BLOOD RIGHT ANTECUBITAL  Final   Special Requests IN PEDIATRIC BOTTLE Blood Culture adequate volume  Final   Culture NO GROWTH < 24 HOURS  Final   Report Status PENDING  Incomplete         Radiology Studies: Dg Chest 2 View  Result Date: 08/12/2017 CLINICAL DATA:  Fever. EXAM: CHEST  2 VIEW COMPARISON:  CT chest dated July 30, 2017. FINDINGS: Mildly enlarged cardiomediastinal silhouette, unchanged. Prior tricuspid and mitral valve replacements. Epicardial pacing wires are again noted. Small right pleural effusion with adjacent right lower lobe interstitial thickening and patchy density. The left lung is clear. No pneumothorax. No acute osseous abnormality. IMPRESSION: Persistent  small right pleural effusion with adjacent prominent interstitial thickening and patchy density within the right lower lobe, which could represent pneumonia. Electronically Signed   By: Obie Dredge M.D.   On: 08/12/2017 12:41        Scheduled Meds: . buprenorphine-naloxone  2 tablet Sublingual Daily  . carvedilol  6.25 mg Oral BID WC  . enoxaparin  80 mg Subcutaneous BID  . nicotine  21 mg Transdermal Daily  . rifampin  300 mg Oral Q8H  . Warfarin - Pharmacist Dosing Inpatient   Does not apply q1800   Continuous Infusions: .  ceFAZolin (ANCEF) IV Stopped (08/14/17 0542)  . gentamicin Stopped (08/14/17 0714)     LOS: 2 days    Time spent: 35 minutes.    Kathlen Mody, MD [facility] Pager [phone]  If  7PM-7AM, please contact night-coverage [email or web address] Password [email or web address] 08/14/2017, 8:18 AM

## 2017-08-15 LAB — GENTAMICIN LEVEL, TROUGH: Gentamicin Trough: <0.5 ug/mL — ABNORMAL LOW (ref 0.5–2.0)

## 2017-08-15 LAB — PROTIME-INR
INR: 2.07
PROTHROMBIN TIME: 23.2 s — AB (ref 11.4–15.2)

## 2017-08-15 MED ORDER — WARFARIN SODIUM 5 MG PO TABS
10.0000 mg | ORAL_TABLET | Freq: Once | ORAL | Status: AC
  Administered 2017-08-15: 10 mg via ORAL
  Filled 2017-08-15: qty 2

## 2017-08-15 MED ORDER — WARFARIN SODIUM 4 MG PO TABS
8.0000 mg | ORAL_TABLET | Freq: Once | ORAL | Status: DC
  Filled 2017-08-15: qty 2

## 2017-08-15 NOTE — Progress Notes (Signed)
PROGRESS NOTE    Norman Reed  VEL:[id number] DOB: [birth year] DOA: 08/12/2017 PCP: Bobbye Morton, MD    Brief Narrative: Norman Reed is a 31 y.o. male with medical history significant of MSSA endocarditis, chronic systolic heart failure, IV drug abuse, anemia, hepatitis C, was undergoing treatment for the MSSA endocarditis with IV Antibiotics until 8/17 , when he left AMA with oral rifampin and oral zyvox,. He came back to ED, with complaints of persistent fevers and was admitted for IV antibiotics. ID consulted and recommendations given.   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  Resume the regimen with IV gentamicin, cefazolin and rifampin.  Pain control.  ID consulted and recommendations given.  PICC line on Monday and possible d/c on Tuesday to SNF when  Bed available   Tobacco abuse:  Nicotine patch.    H/o chronic systolic heart failure with h/o MV/TV replacement:  He appears euvolemic,  Resume coumadin and INR therapeutic.  Resume coreg.   Mild normocytic anemia:  Hemoglobin stable around 11. Anemia probably from chronic disease.    DVT prophylaxis: (coumadin) Code Status: full code.  Family Communication: none at bedside. Disposition Plan: SNF when bed available.    Consultants:   ID   Procedures: none.    Antimicrobials: gentamicin, cefazolin and rifampin.since admission.    Subjective: Body aches improved.    Objective: Vitals:   08/14/17 1441 08/14/17 2153 08/15/17 0538 08/15/17 1512  BP: (!) 122/57 (!) 111/47 (!) 104/43 (!) 112/50  Pulse: 84 86 87 85  Resp: 18 18  18   Temp: 98.9 F  (37.2 C) 99.4 F (37.4 C) 99.1 F (37.3 C) 98.6 F (37 C)  TempSrc: Oral Oral Oral Oral  SpO2: 100% 100% 100% 100%  Weight:      Height:        Intake/Output Summary (Last 24 hours) at 08/15/17 1719 Last data filed at 08/15/17 0721  Gross per 24 hour  Intake                0 ml  Output             1500 ml  Net            -1500 ml   Filed Weights   08/12/17 1528 08/12/17 1801  Weight: 81.6 kg (180 lb) 76.1 kg (167 lb 11.2 oz)    Examination: stable.   General exam: Appears calm and comfortable not in any distress.  Respiratory system: clear to auscultation, no wheezing or rhonchi.  Cardiovascular system: S1 & S2 heard, RRR. No JVD, murmurs, rubs, gallops or clicks. No pedal edema. Gastrointestinal system: Abdomen is soft non tender non distended bowel sounds heard.  Central nervous system: Alert and oriented. No focal neurological deficits. Extremities: Symmetric 5 x 5 power. No pedal edema.  Skin: multiple skin excoriations from scratching.  Psychiatry: Judgement and insight appear normal. Mood & affect appropriate.     Data Reviewed: I have personally reviewed following labs and imaging studies  CBC:  Recent Labs Lab 08/12/17 1204 08/14/17 0942  WBC 8.4 7.9  NEUTROABS 5.5  --   HGB 10.8* 11.7*  HCT 34.4* 36.6*  MCV 83.9 83.2  PLT 190 205   Basic Metabolic Panel:  Recent Labs Lab 08/12/17 1320 08/14/17 0942  NA 134* 135  K 4.5 4.4  CL 98* 100*  CO2 29 26  GLUCOSE 95 93  Norman 14 11  CREATININE 1.13 0.95  CALCIUM 9.1 9.1   GFR: Estimated Creatinine Clearance: 123.5 mL/min (by C-G formula based on SCr of 0.95 mg/dL). Liver Function Tests:  Recent Labs Lab 08/12/17 1320  AST 23  ALT 21  ALKPHOS 84  BILITOT 0.8  PROT 8.5*  ALBUMIN 3.5   No results for input(s): LIPASE, AMYLASE in the last 168 hours. No results for input(s): AMMONIA in the last 168 hours. Coagulation Profile:  Recent Labs Lab 08/12/17 1204 08/13/17 0533 08/14/17 0700  08/15/17 0604  INR 1.92 1.57 2.00 2.07   Cardiac Enzymes: No results for input(s): CKTOTAL, CKMB, CKMBINDEX, TROPONINI in the last 168 hours. BNP (last 3 results) No results for input(s): PROBNP in the last 8760 hours. HbA1C: No results for input(s): HGBA1C in the last 72 hours. CBG: No results for input(s): GLUCAP in the last 168 hours. Lipid Profile: No results for input(s): CHOL, HDL, LDLCALC, TRIG, CHOLHDL, LDLDIRECT in the last 72 hours. Thyroid Function Tests: No results for input(s): TSH, T4TOTAL, FREET4, T3FREE, THYROIDAB in the last 72 hours. Anemia Panel: No results for input(s): VITAMINB12, FOLATE, FERRITIN, TIBC, IRON, RETICCTPCT in the last 72 hours. Sepsis Labs:  Recent Labs Lab 08/12/17 1214 08/12/17 1640  LATICACIDVEN 1.28 1.42    Recent Results (from the past 240 hour(s))  Culture, blood (Routine x 2)     Status: None (Preliminary result)   Collection Time: 08/12/17 12:04 PM  Result Value Ref Range Status   Specimen Description BLOOD WRIST LEFT  Final   Special Requests IN PEDIATRIC BOTTLE Blood Culture adequate volume  Final   Culture NO GROWTH 3 DAYS  Final   Report Status PENDING  Incomplete  Culture, blood (Routine x 2)     Status: None (Preliminary result)   Collection Time: 08/12/17  4:30 PM  Result Value Ref Range Status   Specimen Description BLOOD RIGHT ANTECUBITAL  Final   Special Requests IN PEDIATRIC BOTTLE Blood Culture adequate volume  Final   Culture NO GROWTH 3 DAYS  Final   Report Status PENDING  Incomplete         Radiology Studies: No results found.      Scheduled Meds: . buprenorphine-naloxone  2 tablet Sublingual Daily  . carvedilol  6.25 mg Oral BID WC  . nicotine  21 mg Transdermal Daily  . rifampin  300 mg Oral Q8H  . warfarin  10 mg Oral ONCE-1800  . Warfarin - Pharmacist Dosing Inpatient   Does not apply q1800   Continuous Infusions: .  ceFAZolin (ANCEF) IV Stopped (08/15/17 1340)  . gentamicin 80 mg (08/15/17  0542)     LOS: 3 days    Time spent: 25 minutes.    Kathlen Mody, MD [facility] Pager [phone]  If 7PM-7AM, please contact night-coverage [email or web address] Password [email or web address] 08/15/2017, 5:19 PM

## 2017-08-15 NOTE — Progress Notes (Addendum)
ANTICOAGULATION CONSULT NOTE   Pharmacy Consult for Warfarin  Indication: Hx of PE and afib  Vital Signs: Temp: 99.1 F (37.3 C) (09/01 0538) Temp Source: Oral (09/01 0538) BP: 104/43 (09/01 0538) Pulse Rate: 87 (09/01 0538)  Labs:  Recent Labs  08/12/17 1204 08/12/17 1320 08/13/17 0533 08/14/17 0700 08/14/17 0942 08/15/17 0604  HGB 10.8*  --   --   --  11.7*  --   HCT 34.4*  --   --   --  36.6*  --   PLT 190  --   --   --  205  --   LABPROT 21.8*  --  18.6* 22.5*  --  23.2*  INR 1.92  --  1.57 2.00  --  2.07  CREATININE  --  1.13  --   --  0.95  --     Estimated Creatinine Clearance: 123.5 mL/min (by C-G formula based on SCr of 0.95 mg/dL).  Assessment: 31 yo male admitted with MSSA prosthetic mitral valve endocarditis. Pharmacy consulted to continue PTA warfarin for PE and history of afib with a bioprosthetic mitral and tricuspid valve replacement. Started on Lovenox on admission due to subtherapeutic INR.  INR is therapeutic x2 at 2.07 today.  CBC stable with no reported bleeding.   PTA dose : warfarin 8 mg daily   Goal of Therapy:  INR 2-3  Monitor platelets by anticoagulation protocol: Yes   Plan:  -Warfarin 10 mg po x 1 this evening  -Discontinue Lovenox 80 mg subQ q 12 hours -Monitor daily INR, CBC, clinical course, s/sx of bleed, PO intake, DDI    Thank you for allowing [place] to participate in this patients care.   Diana L. Marcy Salvo, PharmD, MS PGY1 Pharmacy Resident Pager: [phone]

## 2017-08-15 NOTE — Progress Notes (Signed)
Pharmacy Antibiotic Note  Gearld Lemery is a 31 y.o. male admitted on 08/12/2017 with endocarditis.  Pharmacy has been consulted for Gentamicin dosing for synergy. Renal function stable.   Gentamicin trough of <0.5 is at goal for synergy dosing  Plan: -Cont gentamicin 80 mg IV q12h -Re-check trough as indicated   Height: 6' (182.9 cm) Weight: 167 lb 11.2 oz (76.1 kg) IBW/kg (Calculated) : 77.6  Temp (24hrs), Avg:99.1 F (37.3 C), Min:98.9 F (37.2 C), Max:99.4 F (37.4 C)   Recent Labs Lab 08/12/17 1204 08/12/17 1214 08/12/17 1320 08/12/17 1640 08/14/17 0700 08/14/17 0942 08/14/17 1828  WBC 8.4  --   --   --   --  7.9  --   CREATININE  --   --  1.13  --   --  0.95  --   LATICACIDVEN  --  1.28  --  1.42  --   --   --   GENTTROUGH  --   --   --   --  3.1*  --  <0.5*    Estimated Creatinine Clearance: 123.5 mL/min (by C-G formula based on SCr of 0.95 mg/dL).    Allergies  Allergen Reactions  . Penicillins Other (See Comments)    Childhood reaction     Abran Duke 08/15/2017 12:49 AM

## 2017-08-16 LAB — CBC
HCT: 35.6 % — ABNORMAL LOW (ref 39.0–52.0)
Hemoglobin: 11.6 g/dL — ABNORMAL LOW (ref 13.0–17.0)
MCH: 26.5 pg (ref 26.0–34.0)
MCHC: 32.6 g/dL (ref 30.0–36.0)
MCV: 81.3 fL (ref 78.0–100.0)
PLATELETS: 293 10*3/uL (ref 150–400)
RBC: 4.38 MIL/uL (ref 4.22–5.81)
RDW: 14.8 % (ref 11.5–15.5)
WBC: 6.9 10*3/uL (ref 4.0–10.5)

## 2017-08-16 LAB — BASIC METABOLIC PANEL
ANION GAP: 9 (ref 5–15)
BUN: 12 mg/dL (ref 6–20)
CHLORIDE: 98 mmol/L — AB (ref 101–111)
CO2: 26 mmol/L (ref 22–32)
CREATININE: 0.93 mg/dL (ref 0.61–1.24)
Calcium: 9.2 mg/dL (ref 8.9–10.3)
GFR calc Af Amer: >60 mL/min (ref >60)
GFR calc non Af Amer: >60 mL/min (ref >60)
Glucose, Bld: 116 mg/dL — ABNORMAL HIGH (ref 65–99)
Potassium: 4.5 mmol/L (ref 3.5–5.1)
Sodium: 133 mmol/L — ABNORMAL LOW (ref 135–145)

## 2017-08-16 LAB — PROTIME-INR
INR: 1.95
PROTHROMBIN TIME: 22.1 s — AB (ref 11.4–15.2)

## 2017-08-16 MED ORDER — WARFARIN SODIUM 7.5 MG PO TABS
12.5000 mg | ORAL_TABLET | Freq: Once | ORAL | Status: AC
  Administered 2017-08-16: 18:00:00 12.5 mg via ORAL
  Filled 2017-08-16: qty 1

## 2017-08-16 NOTE — Progress Notes (Signed)
PROGRESS NOTE    Norman Reed  VZC:[id number] DOB: 1986-10-23 DOA: 08/12/2017 PCP: Bobbye Morton, MD    Brief Narrative: Norman Reed is a 31 y.o. male with medical history significant of MSSA endocarditis, chronic systolic heart failure, IV drug abuse, anemia, hepatitis C, was undergoing treatment for the MSSA endocarditis with IV Antibiotics until 8/17 , when he left AMA with oral rifampin and oral zyvox,. He came back to ED, with complaints of persistent fevers and was admitted for IV antibiotics. ID consulted and recommendations given.   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  Resume the regimen with IV gentamicin, cefazolin and rifampin.  Pain control.  ID consulted and recommendations given.  PICC line on Monday and possible d/c on Tuesday to SNF when  Bed available   Tobacco abuse:  Nicotine patch.    H/o chronic systolic heart failure with h/o MV/TV replacement:  He appears euvolemic,  Resume coumadin and INR therapeutic.  Resume coreg.   Mild normocytic anemia:  Hemoglobin stable around 11. Anemia probably from chronic disease.    DVT prophylaxis: (coumadin) Code Status: full code.  Family Communication: none at bedside. Disposition Plan: SNF when bed available.    Consultants:   ID   Procedures: none.    Antimicrobials: gentamicin, cefazolin and rifampin.since admission.    Subjective: No new complaints, no chest pain or sob.    Objective: Vitals:   08/15/17 0538 08/15/17 1512 08/15/17 2059 08/16/17 0435  BP: (!) 104/43 (!) 112/50 (!) 103/49 (!) 108/56  Pulse: 87 85 79 84  Resp:  18 16 16   Temp:  99.1 F (37.3 C) 98.6 F (37 C) 98.5 F (36.9 C) 98.3 F (36.8 C)  TempSrc: Oral Oral Oral Oral  SpO2: 100% 100% 100% 100%  Weight:      Height:        Intake/Output Summary (Last 24 hours) at 08/16/17 1503 Last data filed at 08/16/17 0940  Gross per 24 hour  Intake              480 ml  Output              800 ml  Net             -320 ml   Filed Weights   08/12/17 1528 08/12/17 1801  Weight: 81.6 kg (180 lb) 76.1 kg (167 lb 11.2 oz)    Examination:  Stable unchanged from yesterday.  General exam: Appears calm and comfortable not in any distress.  Respiratory system: clear to auscultation, no wheezing or rhonchi.  Cardiovascular system: S1 & S2 heard, RRR. No JVD, murmurs, rubs, gallops or clicks. No pedal edema. Gastrointestinal system: Abdomen is soft non tender non distended bowel sounds heard.  Central nervous system: Alert and oriented. No focal neurological deficits. Extremities: Symmetric 5 x 5 power. No pedal edema.  Skin: multiple skin excoriations from scratching.  Psychiatry: Judgement and insight appear normal. Mood & affect appropriate.     Data Reviewed: I have personally reviewed following labs and imaging studies  CBC:  Recent Labs Lab 08/12/17 1204 08/14/17 0942 08/16/17 0600  WBC 8.4 7.9 6.9  NEUTROABS 5.5  --   --  HGB 10.8* 11.7* 11.6*  HCT 34.4* 36.6* 35.6*  MCV 83.9 83.2 81.3  PLT 190 205 293   Basic Metabolic Panel:  Recent Labs Lab 08/12/17 1320 08/14/17 0942 08/16/17 0600  NA 134* 135 133*  K 4.5 4.4 4.5  CL 98* 100* 98*  CO2 29 26 26   GLUCOSE 95 93 116*  BUN 14 11 12   CREATININE 1.13 0.95 0.93  CALCIUM 9.1 9.1 9.2   GFR: Estimated Creatinine Clearance: 126.2 mL/min (by C-G formula based on SCr of 0.93 mg/dL). Liver Function Tests:  Recent Labs Lab 08/12/17 1320  AST 23  ALT 21  ALKPHOS 84  BILITOT 0.8  PROT 8.5*  ALBUMIN 3.5   No results for input(s): LIPASE, AMYLASE in the last 168 hours. No results for  input(s): AMMONIA in the last 168 hours. Coagulation Profile:  Recent Labs Lab 08/12/17 1204 08/13/17 0533 08/14/17 0700 08/15/17 0604 08/16/17 0600  INR 1.92 1.57 2.00 2.07 1.95   Cardiac Enzymes: No results for input(s): CKTOTAL, CKMB, CKMBINDEX, TROPONINI in the last 168 hours. BNP (last 3 results) No results for input(s): PROBNP in the last 8760 hours. HbA1C: No results for input(s): HGBA1C in the last 72 hours. CBG: No results for input(s): GLUCAP in the last 168 hours. Lipid Profile: No results for input(s): CHOL, HDL, LDLCALC, TRIG, CHOLHDL, LDLDIRECT in the last 72 hours. Thyroid Function Tests: No results for input(s): TSH, T4TOTAL, FREET4, T3FREE, THYROIDAB in the last 72 hours. Anemia Panel: No results for input(s): VITAMINB12, FOLATE, FERRITIN, TIBC, IRON, RETICCTPCT in the last 72 hours. Sepsis Labs:  Recent Labs Lab 08/12/17 1214 08/12/17 1640  LATICACIDVEN 1.28 1.42    Recent Results (from the past 240 hour(s))  Culture, blood (Routine x 2)     Status: None (Preliminary result)   Collection Time: 08/12/17 12:04 PM  Result Value Ref Range Status   Specimen Description BLOOD WRIST LEFT  Final   Special Requests IN PEDIATRIC BOTTLE Blood Culture adequate volume  Final   Culture NO GROWTH 4 DAYS  Final   Report Status PENDING  Incomplete  Culture, blood (Routine x 2)     Status: None (Preliminary result)   Collection Time: 08/12/17  4:30 PM  Result Value Ref Range Status   Specimen Description BLOOD RIGHT ANTECUBITAL  Final   Special Requests IN PEDIATRIC BOTTLE Blood Culture adequate volume  Final   Culture NO GROWTH 4 DAYS  Final   Report Status PENDING  Incomplete         Radiology Studies: No results found.      Scheduled Meds: . buprenorphine-naloxone  2 tablet Sublingual Daily  . carvedilol  6.25 mg Oral BID WC  . nicotine  21 mg Transdermal Daily  . rifampin  300 mg Oral Q8H  . warfarin  12.5 mg Oral ONCE-1800  . Warfarin -  Pharmacist Dosing Inpatient   Does not apply q1800   Continuous Infusions: .  ceFAZolin (ANCEF) IV 2 g (08/16/17 0530)  . gentamicin 80 mg (08/16/17 0600)     LOS: 4 days    Time spent: 25 minutes.    Kathlen Mody, MD [facility] Pager [phone]  If 7PM-7AM, please contact night-coverage [email or web address] Password [email or web address] 08/16/2017, 3:03 PM

## 2017-08-16 NOTE — Progress Notes (Signed)
ANTICOAGULATION CONSULT NOTE - Follow-Up Consult  Pharmacy Consult for Warfarin  Indication: Hx of PE and afib  Vital Signs: Temp: 98.3 F (36.8 C) (09/02 0435) Temp Source: Oral (09/02 0435) BP: 108/56 (09/02 0435) Pulse Rate: 84 (09/02 0435)  Labs:  Recent Labs  08/14/17 0700 08/14/17 0942 08/15/17 0604 08/16/17 0600  HGB  --  11.7*  --  11.6*  HCT  --  36.6*  --  35.6*  PLT  --  205  --  293  LABPROT 22.5*  --  23.2* 22.1*  INR 2.00  --  2.07 1.95  CREATININE  --  0.95  --  0.93    Estimated Creatinine Clearance: 126.2 mL/min (by C-G formula based on SCr of 0.93 mg/dL).  Assessment: 31 yo male admitted with MSSA prosthetic mitral valve endocarditis. Pharmacy consulted to continue PTA warfarin for PE and history of afib with a bioprosthetic mitral and tricuspid valve replacement.   INR is slightly subtherapeutic at 1.95 today, likely due to rifampin compliance  CBC stable with no reported bleeding.   PTA dose : warfarin 8 mg daily   Goal of Therapy:  INR 2-3  Monitor platelets by anticoagulation protocol: Yes   Plan:  Warfarin 12.5 mg po x 1 this evening  Monitor daily INR, CBC, clinical course, s/sx of bleed, PO intake, DDI    Thank you for allowing [place] to participate in this patients care.   Lawsen Arnott L. Marcy Salvo, PharmD, MS PGY1 Pharmacy Resident Pager: [phone]

## 2017-08-17 LAB — CULTURE, BLOOD (ROUTINE X 2)
CULTURE: NO GROWTH
Culture: NO GROWTH
SPECIAL REQUESTS: ADEQUATE
Special Requests: ADEQUATE

## 2017-08-17 LAB — CBC
HEMATOCRIT: 34.3 % — AB (ref 39.0–52.0)
Hemoglobin: 10.8 g/dL — ABNORMAL LOW (ref 13.0–17.0)
MCH: 25.8 pg — ABNORMAL LOW (ref 26.0–34.0)
MCHC: 31.5 g/dL (ref 30.0–36.0)
MCV: 82.1 fL (ref 78.0–100.0)
PLATELETS: 307 10*3/uL (ref 150–400)
RBC: 4.18 MIL/uL — AB (ref 4.22–5.81)
RDW: 15 % (ref 11.5–15.5)
WBC: 8.1 10*3/uL (ref 4.0–10.5)

## 2017-08-17 LAB — PROTIME-INR
INR: 2.05
Prothrombin Time: 23 seconds — ABNORMAL HIGH (ref 11.4–15.2)

## 2017-08-17 MED ORDER — WARFARIN SODIUM 7.5 MG PO TABS
12.5000 mg | ORAL_TABLET | Freq: Once | ORAL | Status: AC
  Administered 2017-08-17: 12.5 mg via ORAL
  Filled 2017-08-17: qty 1

## 2017-08-17 NOTE — Progress Notes (Signed)
PROGRESS NOTE    Kemal Orms  ITG:[id number] DOB: [birth year] DOA: 08/12/2017 PCP: Bobbye Morton, MD    Brief Narrative: Norman Reed is a 31 y.o. male with medical history significant of MSSA endocarditis, chronic systolic heart failure, IV drug abuse, anemia, hepatitis C, was undergoing treatment for the MSSA endocarditis with IV Antibiotics until 8/17 , when he left AMA with oral rifampin and oral zyvox,. He came back to ED, with complaints of persistent fevers and was admitted for IV antibiotics. ID consulted and recommendations given.   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  Resume the regimen with IV gentamicin, cefazolin and rifampin.  Pain control.  ID consulted and recommendations given.  PICC line on Monday and possible d/c on Tuesday to SNF when  Bed available No new complaints.    Tobacco abuse:  Nicotine patch.    H/o chronic systolic heart failure with h/o MV/TV replacement:  He appears euvolemic,  Resume coumadin and INR therapeutic.  Resume coreg. No change in meds.   Mild normocytic anemia:  Hemoglobin stable around 11. Anemia probably from chronic disease.    DVT prophylaxis: (coumadin) Code Status: full code.  Family Communication: none at bedside. Disposition Plan: SNF when bed available.    Consultants:   ID   Procedures: none.    Antimicrobials: gentamicin, cefazolin and rifampin.since admission.    Subjective: No complaints.  No chest pain or sob.    Objective: Vitals:   08/16/17 2208 08/17/17 0702 08/17/17 0821 08/17/17 1404  BP: (!) 111/52 (!) 117/48 (!) 108/54 (!) 106/43  Pulse:  88 85 79 82  Resp: 18 18  16   Temp: 99.5 F (37.5 C) 98.7 F (37.1 C)  98.5 F (36.9 C)  TempSrc: Oral Oral    SpO2: 100% 100%  100%  Weight:      Height:        Intake/Output Summary (Last 24 hours) at 08/17/17 1701 Last data filed at 08/17/17 1404  Gross per 24 hour  Intake              480 ml  Output             1900 ml  Net            -1420 ml   Filed Weights   08/12/17 1528 08/12/17 1801  Weight: 81.6 kg (180 lb) 76.1 kg (167 lb 11.2 oz)    Examination:  Unchanged from yesterday.  General exam: Appears calm and comfortable not in any distress.  Respiratory system: clear to auscultation, no wheezing or rhonchi.  Cardiovascular system: S1 & S2 heard, RRR. No JVD, murmurs, rubs, gallops or clicks. No pedal edema. Gastrointestinal system: Abdomen is soft non tender non distended bowel sounds heard.  Central nervous system: Alert and oriented. No focal neurological deficits. Extremities: Symmetric 5 x 5 power. No pedal edema.  Skin: multiple skin excoriations from scratching.  Psychiatry: Judgement and insight appear normal. Mood & affect appropriate.     Data Reviewed: I have personally reviewed following labs and imaging studies  CBC:  Recent Labs Lab 08/12/17 1204 08/14/17 0942 08/16/17 0600 08/17/17 0338  WBC 8.4 7.9 6.9 8.1  NEUTROABS 5.5  --   --   --   HGB 10.8* 11.7* 11.6* 10.8*  HCT 34.4* 36.6* 35.6* 34.3*  MCV 83.9 83.2 81.3 82.1  PLT 190 205 293 307   Basic Metabolic Panel:  Recent Labs Lab 08/12/17 1320 08/14/17 0942 08/16/17 0600  NA 134* 135 133*  K 4.5 4.4 4.5  CL 98* 100* 98*  CO2 29 26 26   GLUCOSE 95 93 116*  BUN 14 11 12   CREATININE 1.13 0.95 0.93  CALCIUM 9.1 9.1 9.2   GFR: Estimated Creatinine Clearance: 126.2 mL/min (by C-G formula based on SCr of 0.93 mg/dL). Liver Function Tests:  Recent Labs Lab 08/12/17 1320  AST 23  ALT 21  ALKPHOS 84  BILITOT 0.8  PROT 8.5*  ALBUMIN 3.5   No results for input(s): LIPASE,  AMYLASE in the last 168 hours. No results for input(s): AMMONIA in the last 168 hours. Coagulation Profile:  Recent Labs Lab 08/13/17 0533 08/14/17 0700 08/15/17 0604 08/16/17 0600 08/17/17 0338  INR 1.57 2.00 2.07 1.95 2.05   Cardiac Enzymes: No results for input(s): CKTOTAL, CKMB, CKMBINDEX, TROPONINI in the last 168 hours. BNP (last 3 results) No results for input(s): PROBNP in the last 8760 hours. HbA1C: No results for input(s): HGBA1C in the last 72 hours. CBG: No results for input(s): GLUCAP in the last 168 hours. Lipid Profile: No results for input(s): CHOL, HDL, LDLCALC, TRIG, CHOLHDL, LDLDIRECT in the last 72 hours. Thyroid Function Tests: No results for input(s): TSH, T4TOTAL, FREET4, T3FREE, THYROIDAB in the last 72 hours. Anemia Panel: No results for input(s): VITAMINB12, FOLATE, FERRITIN, TIBC, IRON, RETICCTPCT in the last 72 hours. Sepsis Labs:  Recent Labs Lab 08/12/17 1214 08/12/17 1640  LATICACIDVEN 1.28 1.42    Recent Results (from the past 240 hour(s))  Culture, blood (Routine x 2)     Status: None   Collection Time: 08/12/17 12:04 PM  Result Value Ref Range Status   Specimen Description BLOOD WRIST LEFT  Final   Special Requests IN PEDIATRIC BOTTLE Blood Culture adequate volume  Final   Culture NO GROWTH 5 DAYS  Final   Report Status 08/17/2017 FINAL  Final  Culture, blood (Routine x 2)     Status: None   Collection Time: 08/12/17  4:30 PM  Result Value Ref Range Status   Specimen Description BLOOD RIGHT ANTECUBITAL  Final   Special Requests IN PEDIATRIC BOTTLE Blood Culture adequate volume  Final   Culture NO GROWTH 5 DAYS  Final   Report Status 08/17/2017 FINAL  Final         Radiology Studies: No results found.      Scheduled Meds: . buprenorphine-naloxone  2 tablet Sublingual Daily  . carvedilol  6.25 mg Oral BID WC  . nicotine  21 mg Transdermal Daily  . rifampin  300 mg Oral Q8H  . warfarin  12.5 mg Oral ONCE-1800  .  Warfarin - Pharmacist Dosing Inpatient   Does not apply q1800   Continuous Infusions: .  ceFAZolin (ANCEF) IV Stopped (08/17/17 1435)  . gentamicin Stopped (08/17/17 0641)     LOS: 5 days    Time spent: 25 minutes.    Kathlen Mody, MD [facility] Pager [phone]  If 7PM-7AM, please contact night-coverage [email or web address] Password [email or web address] 08/17/2017, 5:01 PM

## 2017-08-17 NOTE — Progress Notes (Signed)
Pharmacy Antibiotic Note  Norman Reed is a 31 y.o. male admitted on 08/12/2017 with endocarditis.  Pharmacy has been consulted for gentamicin for synergy, and cefazolin dosing.  Plan: -Gentamicin 80 mg IV q12h (for synergy dosing)-trough is good at <0.5 -Ancef 2 g IV q8h -Rifampin 300 mg PO q8h -Monitor clinical progress and cultures  Height: 6' (182.9 cm) Weight: 167 lb 11.2 oz (76.1 kg) IBW/kg (Calculated) : 77.6  Temp (24hrs), Avg:98.6 F (37 C), Min:97.5 F (36.4 C), Max:99.5 F (37.5 C)   Recent Labs Lab 08/12/17 1204 08/12/17 1214 08/12/17 1320 08/12/17 1640 08/14/17 0700 08/14/17 0942 08/14/17 1828 08/16/17 0600 08/17/17 0338  WBC 8.4  --   --   --   --  7.9  --  6.9 8.1  CREATININE  --   --  1.13  --   --  0.95  --  0.93  --   LATICACIDVEN  --  1.28  --  1.42  --   --   --   --   --   GENTTROUGH  --   --   --   --  3.1*  --  <0.5*  --   --     Estimated Creatinine Clearance: 126.2 mL/min (by C-G formula based on SCr of 0.93 mg/dL).    Allergies  Allergen Reactions  . Penicillins Other (See Comments)    Childhood reaction    Antimicrobials this admission: . 8/29 gentamicin >> 9/7 . 8/29 Ancef  >> 9/25 . 8/29 rifampin >> 9/25  Microbiology results: 8/29 Blood Cx x2 > NGTD    Thank you for allowing pharmacy to be a part of this patient's care.  Diana L. Marcy Salvo, PharmD, MS PGY1 Pharmacy Resident Pager: [phone]

## 2017-08-17 NOTE — Progress Notes (Addendum)
9/4: Per Medical Director patient is unable to discharge to SNF for his IV antibiotics. Patient will remain in the hospital until his course is completed.   9/3 3pm: CSW unaware that patient was started on suboxone. CSW checking with snf to see if they are able to accommodate it. Updated patient's mother per request.   9/3 10am: Patient's only SNF option is [facility]. They will start insurance auth tomorrow (holiday today).   Osborne Casco Bianey Tesoro LCSWA [phone]

## 2017-08-17 NOTE — Progress Notes (Signed)
ANTICOAGULATION CONSULT NOTE - Follow-Up Consult  Pharmacy Consult for Warfarin  Indication: Hx of PE and afib  Vital Signs: Temp: 98.7 F (37.1 C) (09/03 0702) Temp Source: Oral (09/03 0702) BP: 108/54 (09/03 0821) Pulse Rate: 79 (09/03 0821)  Labs:  Recent Labs  08/14/17 0942 08/15/17 0604 08/16/17 0600 08/17/17 0338  HGB 11.7*  --  11.6* 10.8*  HCT 36.6*  --  35.6* 34.3*  PLT 205  --  293 307  LABPROT  --  23.2* 22.1* 23.0*  INR  --  2.07 1.95 2.05  CREATININE 0.95  --  0.93  --     Estimated Creatinine Clearance: 126.2 mL/min (by C-G formula based on SCr of 0.93 mg/dL).  Assessment: 31 yo male admitted with MSSA prosthetic mitral valve endocarditis. Pharmacy consulted to continue PTA warfarin for PE and history of Afib with a bioprosthetic mitral and tricuspid valve replacement.   INR is therapeutic at 2.05 today Requiring higher doses of Coumadin due to rifampin  CBC stable with no reported bleeding.   PTA dose : warfarin 8 mg daily   Goal of Therapy:  INR 2-3  Monitor platelets by anticoagulation protocol: Yes   Plan:  Warfarin 12.5 mg po x 1 this evening  Monitor daily INR, CBC, clinical course, s/sx of bleed, DDI    Thank you for allowing [place] to participate in this patients care.   Denya Buckingham L. Marcy Salvo, PharmD, MS PGY1 Pharmacy Resident Pager: [phone]

## 2017-08-18 LAB — CBC
HCT: 33.7 % — ABNORMAL LOW (ref 39.0–52.0)
HEMOGLOBIN: 10.7 g/dL — AB (ref 13.0–17.0)
MCH: 26 pg (ref 26.0–34.0)
MCHC: 31.8 g/dL (ref 30.0–36.0)
MCV: 81.8 fL (ref 78.0–100.0)
Platelets: 295 10*3/uL (ref 150–400)
RBC: 4.12 MIL/uL — AB (ref 4.22–5.81)
RDW: 14.8 % (ref 11.5–15.5)
WBC: 7.4 10*3/uL (ref 4.0–10.5)

## 2017-08-18 LAB — BASIC METABOLIC PANEL
Anion gap: 8 (ref 5–15)
BUN: 15 mg/dL (ref 6–20)
CHLORIDE: 99 mmol/L — AB (ref 101–111)
CO2: 26 mmol/L (ref 22–32)
Calcium: 9.1 mg/dL (ref 8.9–10.3)
Creatinine, Ser: 0.98 mg/dL (ref 0.61–1.24)
GFR calc Af Amer: >60 mL/min (ref >60)
GFR calc non Af Amer: >60 mL/min (ref >60)
Glucose, Bld: 128 mg/dL — ABNORMAL HIGH (ref 65–99)
POTASSIUM: 4.1 mmol/L (ref 3.5–5.1)
SODIUM: 133 mmol/L — AB (ref 135–145)

## 2017-08-18 LAB — PROTIME-INR
INR: 2.39
PROTHROMBIN TIME: 25.9 s — AB (ref 11.4–15.2)

## 2017-08-18 MED ORDER — SODIUM CHLORIDE 0.9% FLUSH
10.0000 mL | INTRAVENOUS | Status: DC | PRN
  Administered 2017-08-22 – 2017-09-06 (×10): 10 mL
  Filled 2017-08-18 (×10): qty 40

## 2017-08-18 MED ORDER — SODIUM CHLORIDE 0.9% FLUSH
10.0000 mL | Freq: Two times a day (BID) | INTRAVENOUS | Status: DC
  Administered 2017-08-18 – 2017-09-08 (×10): 10 mL

## 2017-08-18 MED ORDER — WARFARIN SODIUM 5 MG PO TABS
10.0000 mg | ORAL_TABLET | Freq: Once | ORAL | Status: AC
  Administered 2017-08-18: 10 mg via ORAL
  Filled 2017-08-18: qty 2

## 2017-08-18 NOTE — Progress Notes (Signed)
Peripherally Inserted Central Catheter/Midline Placement  The IV Nurse has discussed with the patient and/or persons authorized to consent for the patient, the purpose of this procedure and the potential benefits and risks involved with this procedure.  The benefits include less needle sticks, lab draws from the catheter, and the patient may be discharged home with the catheter. Risks include, but not limited to, infection, bleeding, blood clot (thrombus formation), and puncture of an artery; nerve damage and irregular heartbeat and possibility to perform a PICC exchange if needed/ordered by physician.  Alternatives to this procedure were also discussed.  Bard Power PICC patient education guide, fact sheet on infection prevention and patient information card has been provided to patient /or left at bedside.    PICC/Midline Placement Documentation  PICC Single Lumen 08/18/17 PICC Right Basilic 41 cm 1 cm (Active)       Romie Jumper 08/18/2017, 12:01 PM

## 2017-08-18 NOTE — Progress Notes (Signed)
ANTICOAGULATION CONSULT NOTE - Follow Up Consult  Pharmacy Consult for Coumadin Indication: PE and afib  Allergies  Allergen Reactions  . Penicillins Other (See Comments)    Childhood reaction    Patient Measurements: Height: 6' (182.9 cm) Weight: 167 lb 11.2 oz (76.1 kg) IBW/kg (Calculated) : 77.6  Vital Signs: Temp: 98.5 F (36.9 C) (09/04 0516) Temp Source: Oral (09/04 0516) BP: 116/61 (09/04 0516) Pulse Rate: 86 (09/04 0516)  Labs:  Recent Labs  08/16/17 0600 08/17/17 0338 08/18/17 0545  HGB 11.6* 10.8* 10.7*  HCT 35.6* 34.3* 33.7*  PLT 293 307 295  LABPROT 22.1* 23.0* 25.9*  INR 1.95 2.05 2.39  CREATININE 0.93  --  0.98    Estimated Creatinine Clearance: 119.7 mL/min (by C-G formula based on SCr of 0.98 mg/dL).   Assessment:  Warfarin PTA for PE/afib (had tissue MVR per [facility] notes and CTS notes here on 8/13) = INR: 2.05>2.39, Hgb 10.7, Plts table. On Rifampin (inducer) - Warfarin PTA dose: 8 mg daily  Goal of Therapy:  INR 2-3 Monitor platelets by anticoagulation protocol: Yes   Plan:  -Warfarin 10 mg x 1, then likely can resume home dosing -Daily INR/CBC   Norman Reed, PharmD, BCPS Clinical Staff Pharmacist Pager [phone]  Norman Reed 08/18/2017,9:42 AM

## 2017-08-18 NOTE — Progress Notes (Signed)
PROGRESS NOTE    Norman Reed  ZOX:[id number] DOB: 1986-07-19 DOA: 08/12/2017 PCP: Bobbye Morton, MD    Brief Narrative: Norman Reed is a 31 y.o. male with medical history significant of MSSA endocarditis, chronic systolic heart failure, IV drug abuse, anemia, hepatitis C, was undergoing treatment for the MSSA endocarditis with IV Antibiotics until 8/17 , when he left AMA with oral rifampin and oral zyvox,. He came back to ED, with complaints of persistent fevers and was admitted for IV antibiotics. ID consulted and recommendations given.   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  Resume the regimen with IV gentamicin, cefazolin and rifampin.  Pain control.  ID consulted and recommendations given.  PICC line placed today . No new complaints.    Tobacco abuse:  Nicotine patch.    H/o chronic systolic heart failure with h/o MV/TV replacement:  He appears euvolemic,  Resume coumadin and INR therapeutic.  Resume coreg. No change in meds.   H/o IVDU: Currently on suboxone, will need to taper it before discharge to SNF.   Mild normocytic anemia:  Hemoglobin stable around 11. Anemia probably from chronic disease.     DVT prophylaxis: (coumadin) Code Status: full code.  Family Communication: none at bedside. Disposition Plan: SNF when bed available.    Consultants:   ID   Procedures: none.    Antimicrobials: gentamicin, cefazolin and rifampin.since admission.    Subjective: No complaints.  No chest pain or sob.    Objective: Vitals:   08/17/17 1735 08/17/17 2148 08/18/17 0516 08/18/17 1350  BP: (!) 112/56 (!)  111/54 116/61 (!) 100/47  Pulse: 80 88 86 77  Resp:  18 18   Temp:  99.1 F (37.3 C) 98.5 F (36.9 C) 98.4 F (36.9 C)  TempSrc:  Oral Oral Oral  SpO2:  100% 100% 99%  Weight:      Height:        Intake/Output Summary (Last 24 hours) at 08/18/17 1413 Last data filed at 08/18/17 1339  Gross per 24 hour  Intake                0 ml  Output             1625 ml  Net            -1625 ml   Filed Weights   08/12/17 1528 08/12/17 1801  Weight: 81.6 kg (180 lb) 76.1 kg (167 lb 11.2 oz)    Examination:  Unchanged from yesterday.  General exam: Appears calm and comfortable not in any distress.  Respiratory system: clear to auscultation, no wheezing or rhonchi.  Cardiovascular system: S1 & S2 heard, RRR. No JVD, murmurs, rubs, gallops or clicks. No pedal edema. Gastrointestinal system: Abdomen is soft non tender non distended bowel sounds heard.  Central nervous system: Alert and oriented. No focal neurological deficits. Extremities: Symmetric 5 x 5 power. No pedal edema.  Skin: multiple skin excoriations from scratching.  Psychiatry: Judgement and insight appear normal. Mood & affect appropriate.     Data Reviewed: I have personally reviewed following labs and imaging studies  CBC:  Recent Labs Lab 08/12/17 1204 08/14/17 0942 08/16/17 0600  08/17/17 0338 08/18/17 0545  WBC 8.4 7.9 6.9 8.1 7.4  NEUTROABS 5.5  --   --   --   --   HGB 10.8* 11.7* 11.6* 10.8* 10.7*  HCT 34.4* 36.6* 35.6* 34.3* 33.7*  MCV 83.9 83.2 81.3 82.1 81.8  PLT 190 205 293 307 295   Basic Metabolic Panel:  Recent Labs Lab 08/12/17 1320 08/14/17 0942 08/16/17 0600 08/18/17 0545  NA 134* 135 133* 133*  K 4.5 4.4 4.5 4.1  CL 98* 100* 98* 99*  CO2 29 26 26 26   GLUCOSE 95 93 116* 128*  BUN 14 11 12 15   CREATININE 1.13 0.95 0.93 0.98  CALCIUM 9.1 9.1 9.2 9.1   GFR: Estimated Creatinine Clearance: 119.7 mL/min (by C-G formula based on SCr of 0.98 mg/dL). Liver Function Tests:  Recent  Labs Lab 08/12/17 1320  AST 23  ALT 21  ALKPHOS 84  BILITOT 0.8  PROT 8.5*  ALBUMIN 3.5   No results for input(s): LIPASE, AMYLASE in the last 168 hours. No results for input(s): AMMONIA in the last 168 hours. Coagulation Profile:  Recent Labs Lab 08/14/17 0700 08/15/17 0604 08/16/17 0600 08/17/17 0338 08/18/17 0545  INR 2.00 2.07 1.95 2.05 2.39   Cardiac Enzymes: No results for input(s): CKTOTAL, CKMB, CKMBINDEX, TROPONINI in the last 168 hours. BNP (last 3 results) No results for input(s): PROBNP in the last 8760 hours. HbA1C: No results for input(s): HGBA1C in the last 72 hours. CBG: No results for input(s): GLUCAP in the last 168 hours. Lipid Profile: No results for input(s): CHOL, HDL, LDLCALC, TRIG, CHOLHDL, LDLDIRECT in the last 72 hours. Thyroid Function Tests: No results for input(s): TSH, T4TOTAL, FREET4, T3FREE, THYROIDAB in the last 72 hours. Anemia Panel: No results for input(s): VITAMINB12, FOLATE, FERRITIN, TIBC, IRON, RETICCTPCT in the last 72 hours. Sepsis Labs:  Recent Labs Lab 08/12/17 1214 08/12/17 1640  LATICACIDVEN 1.28 1.42    Recent Results (from the past 240 hour(s))  Culture, blood (Routine x 2)     Status: None   Collection Time: 08/12/17 12:04 PM  Result Value Ref Range Status   Specimen Description BLOOD WRIST LEFT  Final   Special Requests IN PEDIATRIC BOTTLE Blood Culture adequate volume  Final   Culture NO GROWTH 5 DAYS  Final   Report Status 08/17/2017 FINAL  Final  Culture, blood (Routine x 2)     Status: None   Collection Time: 08/12/17  4:30 PM  Result Value Ref Range Status   Specimen Description BLOOD RIGHT ANTECUBITAL  Final   Special Requests IN PEDIATRIC BOTTLE Blood Culture adequate volume  Final   Culture NO GROWTH 5 DAYS  Final   Report Status 08/17/2017 FINAL  Final         Radiology Studies: No results found.      Scheduled Meds: . buprenorphine-naloxone  2 tablet Sublingual Daily  . carvedilol   6.25 mg Oral BID WC  . nicotine  21 mg Transdermal Daily  . rifampin  300 mg Oral Q8H  . sodium chloride flush  10-40 mL Intracatheter Q12H  . warfarin  10 mg Oral ONCE-1800  . Warfarin - Pharmacist Dosing Inpatient   Does not apply q1800   Continuous Infusions: .  ceFAZolin (ANCEF) IV 2 g (08/18/17 1331)  . gentamicin Stopped (08/18/17 0646)     LOS: 6 days    Time spent: 25 minutes.    Kathlen Mody, MD [facility] Pager [phone]  If 7PM-7AM, please contact night-coverage [email or web address]  Password [email or web address] 08/18/2017, 2:13 PM

## 2017-08-18 NOTE — Progress Notes (Signed)
IV pump found to be connected to PIV but turned off.  Patient states that he called the desk for 30 minutes when it started beeping that it was finished but no one came so he just turned it off.  Patient informed that he could not do this once PICC was inserted as it would cause the PICC to clot off.  Ginger, RN informed of this after PICC insertion complete.  Requested that nursing staff would reinforce this with patient.  Gasper Lloyd, RN VAST

## 2017-08-18 NOTE — Plan of Care (Addendum)
Problem: Education: Goal: Knowledge of [facility] Education information/materials will improve Outcome: Progressing Pt will be more knowledgeable on prevention of further infection prior to discharge.

## 2017-08-19 LAB — PROTIME-INR
INR: 2.37
Prothrombin Time: 25.7 seconds — ABNORMAL HIGH (ref 11.4–15.2)

## 2017-08-19 MED ORDER — WARFARIN SODIUM 5 MG PO TABS
10.0000 mg | ORAL_TABLET | Freq: Once | ORAL | Status: AC
  Administered 2017-08-19: 10 mg via ORAL
  Filled 2017-08-19: qty 2

## 2017-08-19 NOTE — Progress Notes (Signed)
ANTICOAGULATION CONSULT NOTE - Follow Up Consult  Pharmacy Consult for Coumadin Indication: PE and afib  Allergies  Allergen Reactions  . Penicillins Other (See Comments)    Childhood reaction    Patient Measurements: Height: 6' (182.9 cm) Weight: 167 lb 11.2 oz (76.1 kg) IBW/kg (Calculated) : 77.6  Vital Signs: Temp: 98.4 F (36.9 C) (09/05 0537) Temp Source: Oral (09/05 0537) BP: 116/60 (09/05 0537) Pulse Rate: 85 (09/05 0537)  Labs:  Recent Labs  08/17/17 0338 08/18/17 0545 08/19/17 0340  HGB 10.8* 10.7*  --   HCT 34.3* 33.7*  --   PLT 307 295  --   LABPROT 23.0* 25.9* 25.7*  INR 2.05 2.39 2.37  CREATININE  --  0.98  --     Estimated Creatinine Clearance: 119.7 mL/min (by C-G formula based on SCr of 0.98 mg/dL).   Assessment:  Warfarin PTA for PE/afib (had tissue MVR per [facility] notes and CTS notes here on 8/13) = INR: 2.05>2.39> 2.37 remains stable today, Hgb 10.7, Plts table. On Rifampin (inducer) - Warfarin PTA dose: 8 mg daily  Goal of Therapy:  INR 2-3 Monitor platelets by anticoagulation protocol: Yes   Plan:  -Warfarin 10 mg x 1 again today -Daily INR/CBC   Kaamil Morefield S. Merilynn Finland, PharmD, BCPS Clinical Staff Pharmacist Pager [phone]  [name] 08/19/2017,10:07 AM

## 2017-08-19 NOTE — Progress Notes (Signed)
PROGRESS NOTE [facility]   Washington Reaney   LKJ:[id number] DOB: [birth year]  DOA: 08/12/2017 PCP: Bobbye Morton, MD   Brief Narrative:  Norman Reed is a 31 year old male with medical history significant for MSSA endocarditis, chronic systolic heart failure, IV drug use, anemia, hepatitis C who was undergoing an treatment for MSSA endocarditis with IV antibiotic until 8/17 when he left AMA with oral rifampin and oral Zyvox. Patient returned back to the ED on 08/12/2017 complaining of persistent fever and he was admitted for IV antibiotics.   Subjective: Patient seen and examined, he has no complaints today. Denies chest pain, palpitations and weakness.  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 No change in med Continue to monitor   Tobacco abuse Nicotine patch  Tobacco cessation discussed   Opioid dependent  Patient was started on Suboxone and doing well   Anemia - chronic disease (normocytic)  Hgb stabel   DVT prophylaxis: warfarin Code Status: full code Family Communication: none at bedside Disposition Plan: SNF versus inpatient therapy  Consultants:   ID  Procedures:   None   Antimicrobials: Anti-infectives    Start     Dose/Rate Route Frequency Ordered Stop   08/12/17 2200  ceFAZolin (ANCEF) IVPB 2g/100 mL premix     2 g 200 mL/hr over 30 Minutes Intravenous Every 8 hours 08/12/17 1812     08/12/17 2000  rifampin (RIFADIN) capsule 300 mg     300 mg Oral Every 8 hours 08/12/17 1751     08/12/17 1900  gentamicin (GARAMYCIN) IVPB 80 mg     80 mg 100 mL/hr over 30 Minutes Intravenous Every 12 hours  08/12/17 1812 08/21/17 2359       Objective: Vitals:   08/18/17 1350 08/18/17 2141 08/19/17 0537 08/19/17 1618  BP: (!) 100/47 96/63 116/60 (!) 106/55  Pulse: 77 88 85 78  Resp:  18 18 18   Temp: 98.4 F (36.9 C) 97.6 F (36.4 C) 98.4 F (36.9 C) 98.5 F (36.9 C)  TempSrc: Oral Oral Oral Oral  SpO2: 99% 100% 100% 100%  Weight:      Height:        Intake/Output Summary (Last 24 hours) at 08/19/17 1634 Last data filed at 08/19/17 1617  Gross per 24 hour  Intake              240 ml  Output             1400 ml  Net            -1160 ml   Filed Weights   08/12/17 1528 08/12/17 1801  Weight: 81.6 kg (180 lb) 76.1 kg (167 lb 11.2 oz)    Examination:  General exam: Appears calm and comfortable  HEENT: AC/AT, PERRLA, OP moist and clear Respiratory system: Clear to auscultation. No wheezes,crackle or rhonchi Cardiovascular system: S1 & S2 heard, RRR. + holosystolic harsh murmur.  Gastrointestinal system: Abdomen is nondistended, soft and nontender.  Central nervous system: Alert and oriented. No focal neurological deficits. Extremities: No pedal edema. Skin: No rashes Psychiatry: Judgement and insight appear normal. Mood & affect appropriate.    Data Reviewed: I have personally reviewed  following labs and imaging studies  CBC:  Recent Labs Lab 08/14/17 0942 08/16/17 0600 08/17/17 0338 08/18/17 0545  WBC 7.9 6.9 8.1 7.4  HGB 11.7* 11.6* 10.8* 10.7*  HCT 36.6* 35.6* 34.3* 33.7*  MCV 83.2 81.3 82.1 81.8  PLT 205 293 307 295   Basic Metabolic Panel:  Recent Labs Lab 08/14/17 0942 08/16/17 0600 08/18/17 0545  NA 135 133* 133*  K 4.4 4.5 4.1  CL 100* 98* 99*  CO2 26 26 26   GLUCOSE 93 116* 128*  BUN 11 12 15   CREATININE 0.95 0.93 0.98  CALCIUM 9.1 9.2 9.1   GFR: Estimated Creatinine Clearance: 119.7 mL/min (by C-G formula based on SCr of 0.98 mg/dL). Liver Function Tests: No results for input(s): AST, ALT, ALKPHOS, BILITOT, PROT, ALBUMIN in the last 168  hours. No results for input(s): LIPASE, AMYLASE in the last 168 hours. No results for input(s): AMMONIA in the last 168 hours. Coagulation Profile:  Recent Labs Lab 08/15/17 0604 08/16/17 0600 08/17/17 0338 08/18/17 0545 08/19/17 0340  INR 2.07 1.95 2.05 2.39 2.37   Cardiac Enzymes: No results for input(s): CKTOTAL, CKMB, CKMBINDEX, TROPONINI in the last 168 hours. BNP (last 3 results) No results for input(s): PROBNP in the last 8760 hours. HbA1C: No results for input(s): HGBA1C in the last 72 hours. CBG: No results for input(s): GLUCAP in the last 168 hours. Lipid Profile: No results for input(s): CHOL, HDL, LDLCALC, TRIG, CHOLHDL, LDLDIRECT in the last 72 hours. Thyroid Function Tests: No results for input(s): TSH, T4TOTAL, FREET4, T3FREE, THYROIDAB in the last 72 hours. Anemia Panel: No results for input(s): VITAMINB12, FOLATE, FERRITIN, TIBC, IRON, RETICCTPCT in the last 72 hours. Sepsis Labs:  Recent Labs Lab 08/12/17 1640  LATICACIDVEN 1.42    Recent Results (from the past 240 hour(s))  Culture, blood (Routine x 2)     Status: None   Collection Time: 08/12/17 12:04 PM  Result Value Ref Range Status   Specimen Description BLOOD WRIST LEFT  Final   Special Requests IN PEDIATRIC BOTTLE Blood Culture adequate volume  Final   Culture NO GROWTH 5 DAYS  Final   Report Status 08/17/2017 FINAL  Final  Culture, blood (Routine x 2)     Status: None   Collection Time: 08/12/17  4:30 PM  Result Value Ref Range Status   Specimen Description BLOOD RIGHT ANTECUBITAL  Final   Special Requests IN PEDIATRIC BOTTLE Blood Culture adequate volume  Final   Culture NO GROWTH 5 DAYS  Final   Report Status 08/17/2017 FINAL  Final      Radiology Studies: No results found.    Scheduled Meds: . buprenorphine-naloxone  2 tablet Sublingual Daily  . carvedilol  6.25 mg Oral BID WC  . nicotine  21 mg Transdermal Daily  . rifampin  300 mg Oral Q8H  . sodium chloride flush  10-40  mL Intracatheter Q12H  . warfarin  10 mg Oral ONCE-1800  . Warfarin - Pharmacist Dosing Inpatient   Does not apply q1800   Continuous Infusions: .  ceFAZolin (ANCEF) IV Stopped (08/19/17 1438)  . gentamicin 80 mg (08/19/17 0612)     LOS: 7 days    Time spent: Total of 15 minutes spent with pt, greater than 50% of which was spent in discussion of  treatment, counseling and coordination of care    Norman Dodrill, MD Pager: Text Page via [email or web address]   If 7PM-7AM, please contact night-coverage [email or web address] 08/19/2017, 4:34 PM

## 2017-08-20 LAB — PROTIME-INR
INR: 2.28
PROTHROMBIN TIME: 24.9 s — AB (ref 11.4–15.2)

## 2017-08-20 MED ORDER — WARFARIN SODIUM 7.5 MG PO TABS
12.5000 mg | ORAL_TABLET | Freq: Once | ORAL | Status: AC
  Administered 2017-08-20: 12.5 mg via ORAL
  Filled 2017-08-20: qty 1

## 2017-08-20 NOTE — Progress Notes (Addendum)
ANTICOAGULATION CONSULT NOTE - Follow Up Consult  Pharmacy Consult for Warfarin dosing Indication: atrial fibrillation and PE  Allergies  Allergen Reactions  . Penicillins Other (See Comments)    Childhood reaction    Patient Measurements: Height: 6' (182.9 cm) Weight: 167 lb 11.2 oz (76.1 kg) IBW/kg (Calculated) : 77.6   Vital Signs: Temp: 98.7 F (37.1 C) (09/06 0622) Temp Source: Oral (09/06 0622) BP: 121/57 (09/06 0622) Pulse Rate: 83 (09/06 0622)  Labs:  Recent Labs  08/18/17 0545 08/19/17 0340 08/20/17 0508  HGB 10.7*  --   --   HCT 33.7*  --   --   PLT 295  --   --   LABPROT 25.9* 25.7* 24.9*  INR 2.39 2.37 2.28  CREATININE 0.98  --   --     Estimated Creatinine Clearance: 119.7 mL/min (by C-G formula based on SCr of 0.98 mg/dL).   Medications:  Scheduled:  . buprenorphine-naloxone  2 tablet Sublingual Daily  . carvedilol  6.25 mg Oral BID WC  . nicotine  21 mg Transdermal Daily  . rifampin  300 mg Oral Q8H  . sodium chloride flush  10-40 mL Intracatheter Q12H  . Warfarin - Pharmacist Dosing Inpatient   Does not apply q1800    Assessment: 32 yo male admitted for MSSA endocarditis on warfarin PTA for history of afib/PE. His INR has been trending down (2.39>2.37>2.28) and on rifampin (inducer) so will increase warfarin dose today. Hgb and Plts stable, no reports of bleeding.  - Warfarin PTA dose: 8mg  daily   Goal of Therapy:  INR 2-3 Monitor platelets by anticoagulation protocol: Yes   Plan:  - Warfarin 12.5mg  x1 - daily INR/CBC  Heather Hames 08/20/2017,12:23 PM   I discussed / reviewed the pharmacy note by Mrs. Hames, P-3 student and I agree with the findings and plans as documented.  [employer], Pharm.D., BCPS Clinical Pharmacist Pager: [phone] Clinical phone for 08/20/2017 from 8:30-4:00 is [phone]. After 4pm, please call Main [facility] [phone]) for assistance. 08/20/2017 1:19 PM

## 2017-08-20 NOTE — Progress Notes (Signed)
PROGRESS NOTE [facility]   [name]   ZOX:[id number] DOB: [birth year]  DOA: 08/12/2017 PCP: Bobbye Morton, MD   Brief Narrative:  Norman Reed is a 31 year old male with medical history significant for MSSA endocarditis, chronic systolic heart failure, IV drug use, anemia, hepatitis C who was undergoing an treatment for MSSA endocarditis with IV antibiotic until 8/17 when he left AMA with oral rifampin and oral Zyvox. Patient returned back to the ED on 08/12/2017 complaining of persistent fever and he was admitted for IV antibiotics.   Subjective: No new complaints today patient resting comfortable.  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 No change in med Continue to monitor   Tobacco abuse Nicotine patch  Tobacco cessation discussed   Opioid dependent  Patient was started on Suboxone and doing well   Anemia - chronic disease (normocytic)  Hgb stabel   DVT prophylaxis: warfarin Code Status: full code Family Communication: none at bedside Disposition Plan: Will continue inpatient abx therapy as this is the safest choice for his opiate dependence.  Consultants:   ID  Procedures:   None   Antimicrobials: Anti-infectives    Start     Dose/Rate Route Frequency Ordered Stop   08/12/17 2200  ceFAZolin (ANCEF) IVPB 2g/100 mL premix     2 g 200 mL/hr over 30 Minutes Intravenous Every 8 hours 08/12/17 1812     08/12/17 2000  rifampin (RIFADIN) capsule 300 mg     300 mg Oral Every 8 hours 08/12/17 1751     08/12/17 1900  gentamicin (GARAMYCIN) IVPB 80 mg     80 mg 100 mL/hr over 30 Minutes Intravenous Every  12 hours 08/12/17 1812 08/21/17 2359      Objective: Vitals:   08/19/17 0537 08/19/17 1618 08/19/17 2207 08/20/17 0622  BP: 116/60 (!) 106/55 (!) 109/52 (!) 121/57  Pulse: 85 78 78 83  Resp: Temp: 98.4 F (36.9 C) 98.5 F (36.9 C) 99 F (37.2 C) 98.7 F (37.1 C)  TempSrc: Oral Oral Oral Oral  SpO2: 100% 100% 99% 100%  Weight:      Height:        Intake/Output Summary (Last 24 hours) at 08/20/17 1542 Last data filed at 08/20/17 0745  Gross per 24 hour  Intake                0 ml  Output             1370 ml  Net            -1370 ml   Filed Weights   08/12/17 1528 08/12/17 1801  Weight: 81.6 kg (180 lb) 76.1 kg (167 lb 11.2 oz)    Examination:  General: Pt is alert, awake, not in acute distress Cardiovascular: RRR, S1/S2 +, no rubs, no gallops Respiratory: CTA bilaterally, no wheezing, no rhonchi Abdominal: Soft, NT, ND, bowel sounds + Extremities: no edema, no cyanosis   Data Reviewed: I have personally reviewed following labs and imaging studies  CBC:  Recent Labs Lab 08/14/17 0942 08/16/17 0600 08/17/17 0338 08/18/17 0545  WBC 7.9 6.9 8.1 7.4  HGB 11.7* 11.6* 10.8*  10.7*  HCT 36.6* 35.6* 34.3* 33.7*  MCV 83.2 81.3 82.1 81.8  PLT 205 293 307 295   Basic Metabolic Panel:  Recent Labs Lab 08/14/17 0942 08/16/17 0600 08/18/17 0545  NA 135 133* 133*  K 4.4 4.5 4.1  CL 100* 98* 99*  CO2 26 26 26   GLUCOSE 93 116* 128*  BUN 11 12 15   CREATININE 0.95 0.93 0.98  CALCIUM 9.1 9.2 9.1   GFR: Estimated Creatinine Clearance: 119.7 mL/min (by C-G formula based on SCr of 0.98 mg/dL). Liver Function Tests: No results for input(s): AST, ALT, ALKPHOS, BILITOT, PROT, ALBUMIN in the last 168 hours. No results for input(s): LIPASE, AMYLASE in the last 168 hours. No results for input(s): AMMONIA in the last 168 hours. Coagulation Profile:  Recent Labs Lab 08/16/17 0600 08/17/17 0338 08/18/17 0545 08/19/17 0340 08/20/17 0508  INR 1.95 2.05  2.39 2.37 2.28   Cardiac Enzymes: No results for input(s): CKTOTAL, CKMB, CKMBINDEX, TROPONINI in the last 168 hours. BNP (last 3 results) No results for input(s): PROBNP in the last 8760 hours. HbA1C: No results for input(s): HGBA1C in the last 72 hours. CBG: No results for input(s): GLUCAP in the last 168 hours. Lipid Profile: No results for input(s): CHOL, HDL, LDLCALC, TRIG, CHOLHDL, LDLDIRECT in the last 72 hours. Thyroid Function Tests: No results for input(s): TSH, T4TOTAL, FREET4, T3FREE, THYROIDAB in the last 72 hours. Anemia Panel: No results for input(s): VITAMINB12, FOLATE, FERRITIN, TIBC, IRON, RETICCTPCT in the last 72 hours. Sepsis Labs: No results for input(s): PROCALCITON, LATICACIDVEN in the last 168 hours.  Recent Results (from the past 240 hour(s))  Culture, blood (Routine x 2)     Status: None   Collection Time: 08/12/17 12:04 PM  Result Value Ref Range Status   Specimen Description BLOOD WRIST LEFT  Final   Special Requests IN PEDIATRIC BOTTLE Blood Culture adequate volume  Final   Culture NO GROWTH 5 DAYS  Final   Report Status 08/17/2017 FINAL  Final  Culture, blood (Routine x 2)     Status: None   Collection Time: 08/12/17  4:30 PM  Result Value Ref Range Status   Specimen Description BLOOD RIGHT ANTECUBITAL  Final   Special Requests IN PEDIATRIC BOTTLE Blood Culture adequate volume  Final   Culture NO GROWTH 5 DAYS  Final   Report Status 08/17/2017 FINAL  Final      Radiology Studies: No results found.    Scheduled Meds: . buprenorphine-naloxone  2 tablet Sublingual Daily  . carvedilol  6.25 mg Oral BID WC  . nicotine  21 mg Transdermal Daily  . rifampin  300 mg Oral Q8H  . sodium chloride flush  10-40 mL Intracatheter Q12H  . warfarin  12.5 mg Oral ONCE-1800  . Warfarin - Pharmacist Dosing Inpatient   Does not apply q1800   Continuous Infusions: .  ceFAZolin (ANCEF) IV 2 g (08/20/17 1457)  . gentamicin Stopped (08/20/17 0851)     LOS:  8 days    Time spent: Total of 15 minutes spent with pt, greater than 50% of which was spent in discussion of  treatment, counseling and coordination of care    Latrelle Dodrill, MD Pager: Text Page via [email or web address]   If 7PM-7AM, please contact night-coverage [email or web address] 08/20/2017, 3:42 PM

## 2017-08-20 NOTE — Progress Notes (Signed)
Pharmacy Antibiotic Note  Norman Reed is a 31 y.o. male admitted on 08/12/2017 with MSSA endocarditis.  Pharmacy has been consulted for gentamicin, ancef, and rifampin dosing. Pt is afebrile, WBC wnl, Scr stable, responding well to treatment.   Plan: - Gentamicin 80mg  IV q12h (for synergy dosing) - trough is good at <0.5. Stopping 9/7 - Ancef 2g IV q8h through 9/25 - Rifampin 300 mg q8h through 9/25  Height: 6' (182.9 cm) Weight: 167 lb 11.2 oz (76.1 kg) IBW/kg (Calculated) : 77.6  Temp (24hrs), Avg:98.7 F (37.1 C), Min:98.5 F (36.9 C), Max:99 F (37.2 C)   Recent Labs Lab 08/14/17 0700 08/14/17 0942 08/14/17 1828 08/16/17 0600 08/17/17 0338 08/18/17 0545  WBC  --  7.9  --  6.9 8.1 7.4  CREATININE  --  0.95  --  0.93  --  0.98  GENTTROUGH 3.1*  --  <0.5*  --   --   --     Estimated Creatinine Clearance: 119.7 mL/min (by C-G formula based on SCr of 0.98 mg/dL).    Allergies  Allergen Reactions  . Penicillins Other (See Comments)    Childhood reaction    Antimicrobials this admission: 8/29 Gentamicin >> 9/7 8/29 Ancef >> 9/25 8/29 Rifampin >> 9/25  Dose adjustments this admission: N/A  Microbiology results: 8/29 BCx: negative   Thank you for allowing pharmacy to be a part of this patient's care.  Akbar Sacra 08/20/2017 12:10 PM

## 2017-08-21 LAB — BASIC METABOLIC PANEL
Anion gap: 8 (ref 5–15)
BUN: 15 mg/dL (ref 6–20)
CALCIUM: 8.9 mg/dL (ref 8.9–10.3)
CO2: 27 mmol/L (ref 22–32)
CREATININE: 1.07 mg/dL (ref 0.61–1.24)
Chloride: 99 mmol/L — ABNORMAL LOW (ref 101–111)
GFR calc non Af Amer: >60 mL/min (ref >60)
Glucose, Bld: 99 mg/dL (ref 65–99)
Potassium: 4.3 mmol/L (ref 3.5–5.1)
Sodium: 134 mmol/L — ABNORMAL LOW (ref 135–145)

## 2017-08-21 LAB — PROTIME-INR
INR: 2.29
PROTHROMBIN TIME: 25 s — AB (ref 11.4–15.2)

## 2017-08-21 MED ORDER — WARFARIN SODIUM 7.5 MG PO TABS
12.5000 mg | ORAL_TABLET | Freq: Once | ORAL | Status: AC
  Administered 2017-08-21: 18:00:00 12.5 mg via ORAL
  Filled 2017-08-21: qty 1

## 2017-08-21 NOTE — Progress Notes (Signed)
PROGRESS NOTE [facility]   Chinmay Kinyon   ZOX:[id number] DOB: Nov 01, 1986  DOA: 08/12/2017 PCP: Bobbye Morton, MD   Brief Narrative:  Norman Reed is a 31 year old male with medical history significant for MSSA endocarditis, chronic systolic heart failure, IV drug use, anemia, hepatitis C who was undergoing an treatment for MSSA endocarditis with IV antibiotic until 8/17 when he left AMA with oral rifampin and oral Zyvox. Patient returned back to the ED on 08/12/2017 complaining of persistent fever and he was admitted for IV antibiotics.   Subjective: Patient continue to do well, have no complaints. 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 No change in med Continue to monitor   Tobacco abuse Nicotine patch  Tobacco cessation discussed   Opioid dependent  Patient was started on Suboxone and doing well  High risk of relapse if suboxone is tapered off  Anemia - chronic disease (normocytic)  Hgb stabel   DVT prophylaxis: warfarin Code Status: full code Family Communication: none at bedside Disposition Plan: Will continue inpatient abx therapy as this is the safest choice for his opiate dependence. SNF would be another option although was not accepted due to Suboxone   Consultants:   ID  Procedures:   None   Antimicrobials: Anti-infectives    Start     Dose/Rate Route Frequency Ordered Stop   08/12/17 2200  ceFAZolin (ANCEF) IVPB 2g/100 mL premix     2 g 200 mL/hr over 30 Minutes Intravenous Every 8 hours 08/12/17 1812     08/12/17 2000  rifampin (RIFADIN) capsule 300 mg     300 mg Oral Every 8 hours 08/12/17  1751     08/12/17 1900  gentamicin (GARAMYCIN) IVPB 80 mg     80 mg 100 mL/hr over 30 Minutes Intravenous Every 12 hours 08/12/17 1812 08/21/17 2359      Objective: Vitals:   08/20/17 0622 08/20/17 1625 08/20/17 2143 08/21/17 0549  BP: (!) 121/57 (!) 103/48 (!) 113/55 (!) 116/49  Pulse: 83 84 84 84  Resp: Temp: 98.7 F (37.1 C) 99 F (37.2 C) 97.8 F (36.6 C) 98.7 F (37.1 C)  TempSrc: Oral Oral Oral Oral  SpO2: 100% 100% 100% 100%  Weight:      Height:        Intake/Output Summary (Last 24 hours) at 08/21/17 1440 Last data filed at 08/21/17 1406  Gross per 24 hour  Intake              220 ml  Output              300 ml  Net              -80 ml   Filed Weights   08/12/17 1528 08/12/17 1801  Weight: 81.6 kg (180 lb) 76.1 kg (167 lb 11.2 oz)    Examination:  General: Pt is alert, awake, not in acute distress Cardiovascular: RRR, S1/S2 +, no rubs, no gallops Respiratory: CTA bilaterally, no wheezing, no rhonchi Abdominal: Soft, NT, ND, bowel sounds + Extremities: no edema, PICC line in place   Data Reviewed: I have personally reviewed following  labs and imaging studies  CBC:  Recent Labs Lab 08/16/17 0600 08/17/17 0338 08/18/17 0545  WBC 6.9 8.1 7.4  HGB 11.6* 10.8* 10.7*  HCT 35.6* 34.3* 33.7*  MCV 81.3 82.1 81.8  PLT 293 307 295   Basic Metabolic Panel:  Recent Labs Lab 08/16/17 0600 08/18/17 0545 08/21/17 0441  NA 133* 133* 134*  K 4.5 4.1 4.3  CL 98* 99* 99*  CO2 26 26 27   GLUCOSE 116* 128* 99  BUN 12 15 15   CREATININE 0.93 0.98 1.07  CALCIUM 9.2 9.1 8.9   GFR: Estimated Creatinine Clearance: 109.6 mL/min (by C-G formula based on SCr of 1.07 mg/dL). Liver Function Tests: No results for input(s): AST, ALT, ALKPHOS, BILITOT, PROT, ALBUMIN in the last 168 hours. No results for input(s): LIPASE, AMYLASE in the last 168 hours. No results for input(s): AMMONIA in the last 168 hours. Coagulation Profile:  Recent Labs Lab  08/17/17 0338 08/18/17 0545 08/19/17 0340 08/20/17 0508 08/21/17 0441  INR 2.05 2.39 2.37 2.28 2.29   Cardiac Enzymes: No results for input(s): CKTOTAL, CKMB, CKMBINDEX, TROPONINI in the last 168 hours. BNP (last 3 results) No results for input(s): PROBNP in the last 8760 hours. HbA1C: No results for input(s): HGBA1C in the last 72 hours. CBG: No results for input(s): GLUCAP in the last 168 hours. Lipid Profile: No results for input(s): CHOL, HDL, LDLCALC, TRIG, CHOLHDL, LDLDIRECT in the last 72 hours. Thyroid Function Tests: No results for input(s): TSH, T4TOTAL, FREET4, T3FREE, THYROIDAB in the last 72 hours. Anemia Panel: No results for input(s): VITAMINB12, FOLATE, FERRITIN, TIBC, IRON, RETICCTPCT in the last 72 hours. Sepsis Labs: No results for input(s): PROCALCITON, LATICACIDVEN in the last 168 hours.  Recent Results (from the past 240 hour(s))  Culture, blood (Routine x 2)     Status: None   Collection Time: 08/12/17 12:04 PM  Result Value Ref Range Status   Specimen Description BLOOD WRIST LEFT  Final   Special Requests IN PEDIATRIC BOTTLE Blood Culture adequate volume  Final   Culture NO GROWTH 5 DAYS  Final   Report Status 08/17/2017 FINAL  Final  Culture, blood (Routine x 2)     Status: None   Collection Time: 08/12/17  4:30 PM  Result Value Ref Range Status   Specimen Description BLOOD RIGHT ANTECUBITAL  Final   Special Requests IN PEDIATRIC BOTTLE Blood Culture adequate volume  Final   Culture NO GROWTH 5 DAYS  Final   Report Status 08/17/2017 FINAL  Final      Radiology Studies: No results found.    Scheduled Meds: . buprenorphine-naloxone  2 tablet Sublingual Daily  . carvedilol  6.25 mg Oral BID WC  . nicotine  21 mg Transdermal Daily  . rifampin  300 mg Oral Q8H  . sodium chloride flush  10-40 mL Intracatheter Q12H  . warfarin  12.5 mg Oral ONCE-1800  . Warfarin - Pharmacist Dosing Inpatient   Does not apply q1800   Continuous Infusions: .   ceFAZolin (ANCEF) IV Stopped (08/21/17 1436)  . gentamicin 80 mg (08/21/17 0610)     LOS: 9 days    Time spent: Total of 15 minutes spent with pt, greater than 50% of which was spent in discussion of  treatment, counseling and coordination of care   Latrelle Dodrill, MD Pager: Text Page via [email or web address]   If 7PM-7AM, please contact night-coverage [email or web address] 08/21/2017, 2:40 PM

## 2017-08-21 NOTE — Progress Notes (Signed)
ANTICOAGULATION CONSULT NOTE - Follow Up Consult  Pharmacy Consult for warfarin dosing Indication: afib/PE  Allergies  Allergen Reactions  . Penicillins Other (See Comments)    Childhood reaction    Patient Measurements: Height: 6' (182.9 cm) Weight: 167 lb 11.2 oz (76.1 kg) IBW/kg (Calculated) : 77.6   Vital Signs: Temp: 98.7 F (37.1 C) (09/07 0549) Temp Source: Oral (09/07 0549) BP: 116/49 (09/07 0549) Pulse Rate: 84 (09/07 0549)  Labs:  Recent Labs  08/19/17 0340 08/20/17 0508 08/21/17 0441  LABPROT 25.7* 24.9* 25.0*  INR 2.37 2.28 2.29  CREATININE  --   --  1.07    Estimated Creatinine Clearance: 109.6 mL/min (by C-G formula based on SCr of 1.07 mg/dL).   Medications:  Scheduled:  . buprenorphine-naloxone  2 tablet Sublingual Daily  . carvedilol  6.25 mg Oral BID WC  . nicotine  21 mg Transdermal Daily  . rifampin  300 mg Oral Q8H  . sodium chloride flush  10-40 mL Intracatheter Q12H  . Warfarin - Pharmacist Dosing Inpatient   Does not apply q1800    Assessment: 31 yo male who was admitted for MSSA endocarditis on warfarin PTA for history of afib/PE. His INR has been therapeutic (2.37>2.28>2.29). He is also on rifampin (inducer) leading to an increase in his PTA dose so will continue with 12.5mg  dose. Hgb and Plts stable, no reports of bleeding. - Warfarin PTA dose: 8mg  daily   Goal of Therapy:  INR 2-3 Monitor platelets by anticoagulation protocol: Yes   Plan:  - warfarin 12.5mg  x1 - daily INR/CBC  Duong Haydel 08/21/2017,12:15 PM

## 2017-08-22 LAB — PROTIME-INR
INR: 2.15
Prothrombin Time: 23.8 seconds — ABNORMAL HIGH (ref 11.4–15.2)

## 2017-08-22 MED ORDER — WARFARIN SODIUM 7.5 MG PO TABS
12.5000 mg | ORAL_TABLET | Freq: Once | ORAL | Status: AC
  Administered 2017-08-22: 17:00:00 12.5 mg via ORAL
  Filled 2017-08-22: qty 1

## 2017-08-22 NOTE — Progress Notes (Signed)
ANTICOAGULATION CONSULT NOTE - Follow Up Consult  Pharmacy Consult for Warfarin Indication: afib/PE   Patient Measurements: Height: 6' (182.9 cm) Weight: 167 lb 11.2 oz (76.1 kg) IBW/kg (Calculated) : 77.6   Vital Signs: Temp: 98.2 F (36.8 C) (09/08 0443) Temp Source: Oral (09/08 0443) BP: 103/51 (09/08 0443) Pulse Rate: 80 (09/08 0443)  Labs:  Recent Labs  08/20/17 0508 08/21/17 0441 08/22/17 0430  LABPROT 24.9* 25.0* 23.8*  INR 2.28 2.29 2.15  CREATININE  --  1.07  --     Estimated Creatinine Clearance: 109.6 mL/min (by C-G formula based on SCr of 1.07 mg/dL).   Medications:  Scheduled:  . buprenorphine-naloxone  2 tablet Sublingual Daily  . carvedilol  6.25 mg Oral BID WC  . nicotine  21 mg Transdermal Daily  . rifampin  300 mg Oral Q8H  . sodium chloride flush  10-40 mL Intracatheter Q12H  . Warfarin - Pharmacist Dosing Inpatient   Does not apply q1800    Assessment: 31 y.o. male admitted for MSSA endocarditis on warfarin PTA for history of afib/PE. The INR is therapeutic this morning at 2.15. He is on rifampin (inducer) leading to need for increased doses from home regimen.  Hgb and Plts stable, no reports of bleeding. - Warfarin PTA dose: 8mg  daily   Goal of Therapy:  INR 2-3 Monitor platelets by anticoagulation protocol: Yes   Plan:  Warfarin 12.5mg  x1 Daily INR/CBC Continue to monitor for s/sx of bleeding   Blake Divine, Pharm.D. PGY1 Pharmacy Resident 08/22/2017 7:48 AM Main Pharmacy: [phone]

## 2017-08-22 NOTE — Progress Notes (Signed)
PROGRESS NOTE [facility]   Norman Reed   KNL:[id number] DOB: 26-Oct-1986  DOA: 08/12/2017 PCP: Bobbye Morton, MD   Brief Narrative:  Norman Reed is a 31 year old male with medical history significant for MSSA endocarditis, chronic systolic heart failure, IV drug use, anemia, hepatitis C who was undergoing an treatment for MSSA endocarditis with IV antibiotic until 8/17 when he left AMA with oral rifampin and oral Zyvox. Patient returned back to the ED on 08/12/2017 complaining of persistent fever and he was admitted for IV antibiotics.   Subjective: Remains stable - No new complaints   Assessment & Plan: - No changes in plan  MSSA endocarditis of prosthetic mitral valve ID recommendations  IV Ancef and Rifampin from 8/29 to 9/25 Completed course of gentamicin 8/29/ - 9/7 PICC line in place Repeated blood cultures no growth   Chronic systolic heart failure - remains stable  Mitral and tricuspid valve replaced  Appears to be compensated at this time Patient on coreg. No change in med Continue to monitor  On warfarin - monitor INR   Tobacco abuse Nicotine patch  Tobacco cessation discussed   Opioid dependent  Patient was started on Suboxone and doing well  High risk of relapse if suboxone is tapered off  Anemia - chronic disease (normocytic)  Hgb stabel   DVT prophylaxis: warfarin Code Status: full code Family Communication: none at bedside Disposition Plan: Will continue inpatient abx therapy as this is the safest choice for his opiate dependence. SNF would be another option although was not accepted due to Suboxone   Consultants:   ID  Procedures:   None   Antimicrobials: Anti-infectives    Start     Dose/Rate Route Frequency Ordered Stop   08/12/17 2200  ceFAZolin (ANCEF) IVPB 2g/100 mL premix     2 g 200 mL/hr over 30 Minutes Intravenous Every 8 hours 08/12/17 1812     08/12/17 2000  rifampin (RIFADIN) capsule 300 mg     300 mg Oral  Every 8 hours 08/12/17 1751     08/12/17 1900  gentamicin (GARAMYCIN) IVPB 80 mg     80 mg 100 mL/hr over 30 Minutes Intravenous Every 12 hours 08/12/17 1812 08/21/17 2030      Objective: Vitals:   08/21/17 1631 08/21/17 2240 08/21/17 2320 08/22/17 0443  BP: (!) 106/56 (!) 101/44 (!) 106/50 (!) 103/51  Pulse: 79 78 76 80  Resp:  16  18  Temp:  98.2 F (36.8 C)  98.2 F (36.8 C)  TempSrc:  Oral  Oral  SpO2:  100%  100%  Weight:      Height:        Intake/Output Summary (Last 24 hours) at 08/22/17 1558 Last data filed at 08/22/17 1458  Gross per 24 hour  Intake             1464 ml  Output             1400 ml  Net               64 ml   Filed Weights   08/12/17 1528 08/12/17 1801  Weight: 81.6 kg (180 lb) 76.1 kg (167 lb 11.2 oz)    Examination: No changes on physical exam   General: Pt is alert, awake, not in acute distress Cardiovascular: RRR, S1/S2 +, no rubs, no gallops Respiratory: CTA bilaterally, no wheezing, no rhonchi Abdominal: Soft, NT, ND, bowel sounds + Extremities: no edema, PICC line in place  Data Reviewed: I have personally reviewed following labs and imaging studies  CBC:  Recent Labs Lab 08/16/17 0600 08/17/17 0338 08/18/17 0545  WBC 6.9 8.1 7.4  HGB 11.6* 10.8* 10.7*  HCT 35.6* 34.3* 33.7*  MCV 81.3 82.1 81.8  PLT 293 307 295   Basic Metabolic Panel:  Recent Labs Lab 08/16/17 0600 08/18/17 0545 08/21/17 0441  NA 133* 133* 134*  K 4.5 4.1 4.3  CL 98* 99* 99*  CO2 GLUCOSE 116* 128* 99  BUN CREATININE 0.93 0.98 1.07  CALCIUM 9.2 9.1 8.9   GFR: Estimated Creatinine Clearance: 109.6 mL/min (by C-G formula based on SCr of 1.07 mg/dL). Liver Function Tests: No results for input(s): AST, ALT, ALKPHOS, BILITOT, PROT, ALBUMIN in the last 168 hours. No results for input(s): LIPASE, AMYLASE in the last 168 hours. No results for input(s): AMMONIA in the last 168 hours. Coagulation Profile:  Recent Labs Lab  08/18/17 0545 08/19/17 0340 08/20/17 0508 08/21/17 0441 08/22/17 0430  INR 2.39 2.37 2.28 2.29 2.15   Cardiac Enzymes: No results for input(s): CKTOTAL, CKMB, CKMBINDEX, TROPONINI in the last 168 hours. BNP (last 3 results) No results for input(s): PROBNP in the last 8760 hours. HbA1C: No results for input(s): HGBA1C in the last 72 hours. CBG: No results for input(s): GLUCAP in the last 168 hours. Lipid Profile: No results for input(s): CHOL, HDL, LDLCALC, TRIG, CHOLHDL, LDLDIRECT in the last 72 hours. Thyroid Function Tests: No results for input(s): TSH, T4TOTAL, FREET4, T3FREE, THYROIDAB in the last 72 hours. Anemia Panel: No results for input(s): VITAMINB12, FOLATE, FERRITIN, TIBC, IRON, RETICCTPCT in the last 72 hours. Sepsis Labs: No results for input(s): PROCALCITON, LATICACIDVEN in the last 168 hours.  Recent Results (from the past 240 hour(s))  Culture, blood (Routine x 2)     Status: None   Collection Time: 08/12/17  4:30 PM  Result Value Ref Range Status   Specimen Description BLOOD RIGHT ANTECUBITAL  Final   Special Requests IN PEDIATRIC BOTTLE Blood Culture adequate volume  Final   Culture NO GROWTH 5 DAYS  Final   Report Status 08/17/2017 FINAL  Final      Radiology Studies: No results found.    Scheduled Meds: . buprenorphine-naloxone  2 tablet Sublingual Daily  . carvedilol  6.25 mg Oral BID WC  . nicotine  21 mg Transdermal Daily  . rifampin  300 mg Oral Q8H  . sodium chloride flush  10-40 mL Intracatheter Q12H  . warfarin  12.5 mg Oral ONCE-1800  . Warfarin - Pharmacist Dosing Inpatient   Does not apply q1800   Continuous Infusions: .  ceFAZolin (ANCEF) IV Stopped (08/22/17 1339)     LOS: 10 days    Time spent: Total of 15 minutes spent with pt, greater than 50% of which was spent in discussion of  treatment, counseling and coordination of care   Latrelle Dodrill, MD Pager: Text Page via [email or web address]   If 7PM-7AM, please contact  night-coverage [email or web address] 08/22/2017, 3:58 PM

## 2017-08-23 LAB — CBC
HEMATOCRIT: 31.4 % — AB (ref 39.0–52.0)
HEMOGLOBIN: 9.9 g/dL — AB (ref 13.0–17.0)
MCH: 26.1 pg (ref 26.0–34.0)
MCHC: 31.5 g/dL (ref 30.0–36.0)
MCV: 82.6 fL (ref 78.0–100.0)
Platelets: 292 10*3/uL (ref 150–400)
RBC: 3.8 MIL/uL — AB (ref 4.22–5.81)
RDW: 15.3 % (ref 11.5–15.5)
WBC: 8.9 10*3/uL (ref 4.0–10.5)

## 2017-08-23 LAB — PROTIME-INR
INR: 2.1
Prothrombin Time: 23.4 seconds — ABNORMAL HIGH (ref 11.4–15.2)

## 2017-08-23 MED ORDER — WARFARIN SODIUM 7.5 MG PO TABS
12.5000 mg | ORAL_TABLET | Freq: Once | ORAL | Status: AC
  Administered 2017-08-23: 17:00:00 12.5 mg via ORAL
  Filled 2017-08-23: qty 1

## 2017-08-23 NOTE — Progress Notes (Signed)
PROGRESS NOTE    Norman Reed  HKG:[id number] DOB: Jun 21, 1986 DOA: 08/12/2017 PCP: [name] [name], MD    Brief Narrative:  31 year old male percent of fevers. Patient is known to have MSSA endocarditis, chronic systolic heart failure,, hepatitis C, substance abuse, pulmonary embolism. Patient recently hospitalized for endocarditis, left AGAINST MEDICAL ADVICE, he was placed on linezolid and rifampin oral, patient developed recurrent fevers and generalize malaise. On his initial physical examination blood pressure 150/63, heart rate 87, respiratory 21, oxygen saturation 100%. Moist mucous membranes, lungs were clear discussion bilaterally, no wheezing rales or rhonchi, heart with S1-S2 present and rhythmic, no murmurs, rubs or gallops, the abdomen was soft nontender, no lower extremity edema.   Patient was admitted to hospital with MSSA endocarditis.   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 MSSA endocarditis. Patient responding well to antibiotic therapy, will continue cefazolin and rifampin IV. No fevers, no chills, no dyspnea or chest pain.   2. Chronic stable systolic heart failure. Patient euvolemic, will continue close clinical observation, continue carvedilol.   3. Tobacco abuse. Smoking cessation, nicotine patch   4. Anemia. Hb and hct stable, multifactorial anemia.   5. Pulmonary embolism. Will continue anticoagulation with warfarin, for provoked VTE, related to endocarditis.   6. Chronic pain syndrome. Will continue pain control with ibuprofen, tramadol, and suboxone.    DVT prophylaxis: warfarin  Code Status: full Family Communication:  Disposition Plan: home    Consultants:   I.D  Procedures:      Antimicrobials:       Subjective: Patient doing well, no nausea or vomiting, no chest pain.   Objective: Vitals:   08/21/17 2320 08/22/17 0443 08/22/17 2140 08/23/17 0540  BP: (!) 106/50 (!) 103/51 (!) 104/49 (!) 100/44  Pulse: 76 80 84 78  Resp:  18 16 16   Temp:  98.2 F (36.8 C) 99.9 F (37.7 C) 98.1 F (36.7 C)  TempSrc:  Oral Oral Oral  SpO2:  100% 100% 100%  Weight:      Height:        Intake/Output Summary (Last 24 hours) at 08/23/17 1109 Last data filed at 08/23/17 0527  Gross per 24 hour  Intake             1344 ml  Output             1350 ml  Net               -6 ml   Filed Weights   08/12/17 1528 08/12/17 1801  Weight: 81.6 kg (180 lb) 76.1 kg (167 lb 11.2 oz)    Examination:  General: Not in pain or dyspnea Neurology: Awake and alert, non focal  E ENT: no pallor, no icterus, oral mucosa moist Cardiovascular: S1-S2 present, rhythmic, no gallops, rubs, or murmurs. No jugular venous distention, no lower extremity edema. Pulmonary: vesicular breath sounds bilaterally, adequate air movement, no wheezing, rhonchi or rales. Gastrointestinal. Abdomen flat, no organomegaly, non tender, no rebound or guarding Skin. No rashes Musculoskeletal: no joint deformities     Data Reviewed: I have personally reviewed following labs and imaging studies  CBC:  Recent Labs Lab 08/17/17 0338 08/18/17 0545 08/23/17 0440  WBC 8.1 7.4 8.9  HGB 10.8* 10.7* 9.9*  HCT  34.3* 33.7* 31.4*  MCV 82.1 81.8 82.6  PLT 307 295 292   Basic Metabolic Panel:  Recent Labs Lab 08/18/17 0545 08/21/17 0441  NA 133* 134*  K 4.1 4.3  CL 99* 99*  CO2 26 27  GLUCOSE 128* 99  BUN 15 15  CREATININE 0.98 1.07  CALCIUM 9.1 8.9   GFR: Estimated Creatinine Clearance: 109.6 mL/min (by C-G formula based on SCr of 1.07 mg/dL). Liver Function Tests: No results for input(s): AST, ALT, ALKPHOS, BILITOT, PROT, ALBUMIN in the last 168 hours. No results for input(s): LIPASE,  AMYLASE in the last 168 hours. No results for input(s): AMMONIA in the last 168 hours. Coagulation Profile:  Recent Labs Lab 08/19/17 0340 08/20/17 0508 08/21/17 0441 08/22/17 0430 08/23/17 0440  INR 2.37 2.28 2.29 2.15 2.10   Cardiac Enzymes: No results for input(s): CKTOTAL, CKMB, CKMBINDEX, TROPONINI in the last 168 hours. BNP (last 3 results) No results for input(s): PROBNP in the last 8760 hours. HbA1C: No results for input(s): HGBA1C in the last 72 hours. CBG: No results for input(s): GLUCAP in the last 168 hours. Lipid Profile: No results for input(s): CHOL, HDL, LDLCALC, TRIG, CHOLHDL, LDLDIRECT in the last 72 hours. Thyroid Function Tests: No results for input(s): TSH, T4TOTAL, FREET4, T3FREE, THYROIDAB in the last 72 hours. Anemia Panel: No results for input(s): VITAMINB12, FOLATE, FERRITIN, TIBC, IRON, RETICCTPCT in the last 72 hours.    Radiology Studies: I have reviewed all of the imaging during this hospital visit personally     Scheduled Meds: . buprenorphine-naloxone  2 tablet Sublingual Daily  . carvedilol  6.25 mg Oral BID WC  . nicotine  21 mg Transdermal Daily  . rifampin  300 mg Oral Q8H  . sodium chloride flush  10-40 mL Intracatheter Q12H  . warfarin  12.5 mg Oral ONCE-1800  . Warfarin - Pharmacist Dosing Inpatient   Does not apply q1800   Continuous Infusions: .  ceFAZolin (ANCEF) IV Stopped (08/23/17 0530)     LOS: 11 days        [name] [name], MD [facility] Pager [phone]

## 2017-08-23 NOTE — Progress Notes (Signed)
ANTICOAGULATION CONSULT NOTE - Follow Up Consult  Pharmacy Consult for Warfarin Indication: afib/PE   Patient Measurements: Height: 6' (182.9 cm) Weight: 167 lb 11.2 oz (76.1 kg) IBW/kg (Calculated) : 77.6   Vital Signs: Temp: 98.1 F (36.7 C) (09/09 0540) Temp Source: Oral (09/09 0540) BP: 100/44 (09/09 0540) Pulse Rate: 78 (09/09 0540)  Labs:  Recent Labs  08/21/17 0441 08/22/17 0430 08/23/17 0440  HGB  --   --  9.9*  HCT  --   --  31.4*  PLT  --   --  292  LABPROT 25.0* 23.8* 23.4*  INR 2.29 2.15 2.10  CREATININE 1.07  --   --     Estimated Creatinine Clearance: 109.6 mL/min (by C-G formula based on SCr of 1.07 mg/dL).   Assessment: 31 y.o. male admitted for MSSA endocarditis on warfarin PTA for history of afib/PE. The INR is therapeutic this morning at 2.10. He is on rifampin (inducer) leading to need for increased doses from home regimen of 8mg  PO daily.  Hgb and Plts stable, no reports of bleeding.   Goal of Therapy:  INR 2-3 Monitor platelets by anticoagulation protocol: Yes   Plan:  Warfarin 12.5mg  x1 Daily INR/CBC Continue to monitor for s/sx of bleeding   Blake Divine, Pharm.D. PGY1 Pharmacy Resident 08/23/2017 8:13 AM Main Pharmacy: [phone]

## 2017-08-23 NOTE — Progress Notes (Signed)
Pharmacy Antibiotic Note--Follow up  Norman Reed is a 31 y.o. male admitted on 08/12/2017 with endocarditis.  Pharmacy has been consulted for cefazolin dosing. Patient is afebrile and WBC count is within normal limits.   Plan: -Ancef 2 g IV q8h -Rifampin 300 mg PO q8h -Monitor clinical progress  Height: 6' (182.9 cm) Weight: 167 lb 11.2 oz (76.1 kg) IBW/kg (Calculated) : 77.6  Temp (24hrs), Avg:99 F (37.2 C), Min:98.1 F (36.7 C), Max:99.9 F (37.7 C)   Recent Labs Lab 08/17/17 0338 08/18/17 0545 08/21/17 0441 08/23/17 0440  WBC 8.1 7.4  --  8.9  CREATININE  --  0.98 1.07  --     Estimated Creatinine Clearance: 109.6 mL/min (by C-G formula based on SCr of 1.07 mg/dL).    Allergies  Allergen Reactions  . Penicillins Other (See Comments)    Childhood reaction    Antimicrobials this admission: . 8/29 gentamicin >> 9/7 . 8/29 Ancef  >> 9/25 . 8/29 rifampin >> 9/25  Microbiology results: 8/29 Blood Cx x2 > NGTD    Thank you for allowing pharmacy to be a part of this patient's care.  Blake Divine, Pharm.D. PGY1 Pharmacy Resident 08/23/2017 8:18 AM Main Pharmacy: [phone]

## 2017-08-24 LAB — BASIC METABOLIC PANEL
ANION GAP: 7 (ref 5–15)
BUN: 15 mg/dL (ref 6–20)
CALCIUM: 8.8 mg/dL — AB (ref 8.9–10.3)
CO2: 27 mmol/L (ref 22–32)
Chloride: 101 mmol/L (ref 101–111)
Creatinine, Ser: 0.9 mg/dL (ref 0.61–1.24)
Glucose, Bld: 114 mg/dL — ABNORMAL HIGH (ref 65–99)
POTASSIUM: 3.9 mmol/L (ref 3.5–5.1)
SODIUM: 135 mmol/L (ref 135–145)

## 2017-08-24 LAB — CBC
HEMATOCRIT: 31.4 % — AB (ref 39.0–52.0)
HEMOGLOBIN: 9.9 g/dL — AB (ref 13.0–17.0)
MCH: 26.1 pg (ref 26.0–34.0)
MCHC: 31.5 g/dL (ref 30.0–36.0)
MCV: 82.6 fL (ref 78.0–100.0)
Platelets: 279 10*3/uL (ref 150–400)
RBC: 3.8 MIL/uL — ABNORMAL LOW (ref 4.22–5.81)
RDW: 15.2 % (ref 11.5–15.5)
WBC: 8.5 10*3/uL (ref 4.0–10.5)

## 2017-08-24 LAB — PROTIME-INR
INR: 2.06
PROTHROMBIN TIME: 23 s — AB (ref 11.4–15.2)

## 2017-08-24 MED ORDER — SODIUM CHLORIDE 0.9 % IV BOLUS (SEPSIS)
500.0000 mL | Freq: Once | INTRAVENOUS | Status: AC
  Administered 2017-08-24: 500 mL via INTRAVENOUS

## 2017-08-24 MED ORDER — GABAPENTIN 600 MG PO TABS
300.0000 mg | ORAL_TABLET | Freq: Two times a day (BID) | ORAL | Status: DC
  Administered 2017-08-24 – 2017-09-08 (×30): 300 mg via ORAL
  Filled 2017-08-24 (×30): qty 1

## 2017-08-24 MED ORDER — WARFARIN SODIUM 7.5 MG PO TABS
15.0000 mg | ORAL_TABLET | Freq: Once | ORAL | Status: AC
  Administered 2017-08-24: 15 mg via ORAL
  Filled 2017-08-24: qty 2

## 2017-08-24 NOTE — Progress Notes (Signed)
PHARMACY CONSULT NOTE FOR:  OUTPATIENT  PARENTERAL ANTIBIOTIC THERAPY (OPAT)  Indication: MSSA endocarditis Regimen: Cefazolin 2g IV q8h + rifampin 300mg  po Q8H End date: 09/08/17  IV antibiotic discharge orders are pended. To discharging provider:  please sign these orders via discharge navigator,  Select New Orders & click on the button choice - Manage This Unsigned Work.     Thank you for allowing pharmacy to be a part of this patient's care.  Norman Reed 08/24/2017, 8:58 AM

## 2017-08-24 NOTE — Progress Notes (Signed)
PROGRESS NOTE    Norman Reed  XBL:[id number] DOB: Oct 14, 1986 DOA: 08/12/2017 PCP: [name] [name], MD    Brief Narrative:  31 year old male percent of fevers. Patient is known to have MSSA endocarditis, chronic systolic heart failure,, hepatitis C, substance abuse, pulmonary embolism. Patient recently hospitalized for endocarditis, left AGAINST MEDICAL ADVICE, he was placed on linezolid and rifampin oral, patient developed recurrent fevers and generalize malaise. On his initial physical examination blood pressure 150/63, heart rate 87, respiratory 21, oxygen saturation 100%. Moist mucous membranes, lungs were clear discussion bilaterally, no wheezing rales or rhonchi, heart with S1-S2 present and rhythmic, no murmurs, rubs or gallops, the abdomen was soft nontender, no lower extremity edema.   Patient was admitted to hospital with MSSA endocarditis.   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 MSSA endocarditis. Continue antibiotic therapy, with cefazolin and rifampin IV. No noted adverse reactions. Renal function with preserved renal function and normal electrolytes.   2. Chronic stable systolic heart failure. Continue carvedilol, clinically stable.   3. Tobacco abuse. Continue smoking cessation, with nicotine patch   4. Anemia. Hb at 9.9 and hct at 31.   5. Pulmonary embolism. Continue anticoagulation with warfarin, for provoked venous thromboembolism, PE. INR at 2.06, will continue warfarin dosing per pharmacy protocol.    6. Chronic pain syndrome. Pain is well controlled with ibuprofen, tramadol, and suboxone.    DVT prophylaxis: warfarin  Code Status: full Family Communication:  Disposition Plan: home     Consultants:   I.D  Procedures:     Antimicrobials:       Subjective: Patient with no chest pain, no dyspnea, no nausea or vomiting, tolerating po well.   Objective: Vitals:   08/22/17 2140 08/23/17 0540 08/23/17 2051 08/24/17 0549  BP: (!) 104/49 (!) 100/44 (!) 113/47 (!) 107/50  Pulse: 84 78 81 78  Resp: 16 16 14 18   Temp: 99.9 F (37.7 C) 98.1 F (36.7 C) 100.1 F (37.8 C) 98.4 F (36.9 C)  TempSrc: Oral Oral Oral Oral  SpO2: 100% 100% 100% 100%  Weight:      Height:       No intake or output data in the 24 hours ending 08/24/17 1239 Filed Weights   08/12/17 1528 08/12/17 1801  Weight: 81.6 kg (180 lb) 76.1 kg (167 lb 11.2 oz)    Examination:  General: Not in pain or dyspnea Neurology: Awake and alert, non focal  E ENT: no pallor, no icterus, oral mucosa moist Cardiovascular: S1-S2 present, rhythmic, no gallops, rubs. Positive systoloic murmur at the left sternal border. No jugular venous distention, no lower extremity edema. Pulmonary: vesicular breath sounds bilaterally, adequate air movement, no wheezing, rhonchi or rales. Gastrointestinal. Abdomen flat, no organomegaly, non tender, no rebound or guarding Skin. No rashes Musculoskeletal: no joint deformities     Data Reviewed: I have personally reviewed following labs and imaging studies  CBC:  Recent Labs Lab 08/18/17 0545 08/23/17 0440 08/24/17 0423  WBC 7.4 8.9 8.5  HGB 10.7* 9.9* 9.9*  HCT 33.7* 31.4* 31.4*  MCV 81.8 82.6 82.6  PLT 295 292 279   Basic Metabolic Panel:  Recent Labs Lab 08/18/17 0545 08/21/17 0441 08/24/17 0423  NA 133* 134* 135  K 4.1 4.3 3.9  CL  99* 99* 101  CO2 GLUCOSE 128* 99 114*  BUN CREATININE 0.98 1.07 0.90  CALCIUM 9.1 8.9 8.8*   GFR: Estimated Creatinine Clearance: 130.4 mL/min (by C-G formula based on SCr of 0.9 mg/dL). Liver Function Tests: No results for input(s): AST, ALT, ALKPHOS, BILITOT, PROT, ALBUMIN in the  last 168 hours. No results for input(s): LIPASE, AMYLASE in the last 168 hours. No results for input(s): AMMONIA in the last 168 hours. Coagulation Profile:  Recent Labs Lab 08/20/17 0508 08/21/17 0441 08/22/17 0430 08/23/17 0440 08/24/17 0423  INR 2.28 2.29 2.15 2.10 2.06   Cardiac Enzymes: No results for input(s): CKTOTAL, CKMB, CKMBINDEX, TROPONINI in the last 168 hours. BNP (last 3 results) No results for input(s): PROBNP in the last 8760 hours. HbA1C: No results for input(s): HGBA1C in the last 72 hours. CBG: No results for input(s): GLUCAP in the last 168 hours. Lipid Profile: No results for input(s): CHOL, HDL, LDLCALC, TRIG, CHOLHDL, LDLDIRECT in the last 72 hours. Thyroid Function Tests: No results for input(s): TSH, T4TOTAL, FREET4, T3FREE, THYROIDAB in the last 72 hours. Anemia Panel: No results for input(s): VITAMINB12, FOLATE, FERRITIN, TIBC, IRON, RETICCTPCT in the last 72 hours.    Radiology Studies: I have reviewed all of the imaging during this hospital visit personally     Scheduled Meds: . buprenorphine-naloxone  2 tablet Sublingual Daily  . carvedilol  6.25 mg Oral BID WC  . nicotine  21 mg Transdermal Daily  . rifampin  300 mg Oral Q8H  . sodium chloride flush  10-40 mL Intracatheter Q12H  . warfarin  15 mg Oral ONCE-1800  . Warfarin - Pharmacist Dosing Inpatient   Does not apply q1800   Continuous Infusions: .  ceFAZolin (ANCEF) IV Stopped (08/24/17 6578)     LOS: 12 days        [name] [name], MD [facility] Pager [phone]

## 2017-08-24 NOTE — Progress Notes (Signed)
ANTICOAGULATION CONSULT NOTE - Follow Up Consult  Pharmacy Consult for Warfarin Indication: afib/PE   Patient Measurements: Height: 6' (182.9 cm) Weight: 167 lb 11.2 oz (76.1 kg) IBW/kg (Calculated) : 77.6   Vital Signs: Temp: 98.4 F (36.9 C) (09/10 0549) Temp Source: Oral (09/10 0549) BP: 107/50 (09/10 0549) Pulse Rate: 78 (09/10 0549)  Labs:  Recent Labs  08/22/17 0430 08/23/17 0440 08/24/17 0423  HGB  --  9.9* 9.9*  HCT  --  31.4* 31.4*  PLT  --  292 279  LABPROT 23.8* 23.4* 23.0*  INR 2.15 2.10 2.06  CREATININE  --   --  0.90    Estimated Creatinine Clearance: 130.4 mL/min (by C-G formula based on SCr of 0.9 mg/dL).   Assessment: 31 y.o. male admitted for MSSA endocarditis on warfarin PTA for history of afib/PE. The INR is only just therapeutic this morning at 2.06. He is on rifampin (inducer) leading to need for increased doses from home regimen of 8mg  PO daily.  Hgb and Plts stable, no reports of bleeding.   Goal of Therapy:  INR 2-3 Monitor platelets by anticoagulation protocol: Yes   Plan:  Warfarin 15mg  x1 Daily INR/CBC Continue to monitor for s/sx of bleeding   Thank you for allowing [place] to participate in this patients care.  Signe Colt, PharmD Clinical phone for 08/24/2017 from 7a-3:30p: [phone] If after 3:30p, please call main pharmacy at: [phone] 08/24/2017 9:21 AM

## 2017-08-25 LAB — PROTIME-INR
INR: 2.25
Prothrombin Time: 24.7 seconds — ABNORMAL HIGH (ref 11.4–15.2)

## 2017-08-25 MED ORDER — WARFARIN SODIUM 7.5 MG PO TABS
15.0000 mg | ORAL_TABLET | Freq: Once | ORAL | Status: AC
  Administered 2017-08-25: 15 mg via ORAL
  Filled 2017-08-25: qty 2

## 2017-08-25 NOTE — Progress Notes (Signed)
PROGRESS NOTE [facility]   Norman Reed   XJD:[id number] DOB: 1986/02/15  DOA: 08/12/2017 PCP: Bobbye Morton, MD   Brief Narrative:  Norman Reed is a 31 year old male with medical history significant for MSSA endocarditis, chronic systolic heart failure, IV drug use, anemia, hepatitis C who was undergoing an treatment for MSSA endocarditis with IV antibiotic until 8/17 when he left AMA with oral rifampin and oral Zyvox. Patient returned back to the ED on 08/12/2017 complaining of persistent fever and he was admitted for IV antibiotics.   Subjective: No new complaints, note to have isolated low grade fever overnight. Denies chest pain, cough and SOB.   Assessment & Plan:  MSSA endocarditis of prosthetic mitral valve Continue with IV Ancef and Rifampin from 8/29 to 9/25 Completed course of gentamicin 8/29/ - 9/7 Monitor Renal function.  Will check CBC in AM, if spike fever repeat blood cultures and contact ID.   Chronic systolic heart failure - remains stable  Mitral and tricuspid valve replaced  No signs of fluid overload at this time  Continue coreg  On Warfarin for a/c - monitor INR, currently at goal    Tobacco abuse Continue nicotine patch and tobacco cessation discussed  Opioid dependent  Currently on Suboxone doing well   Anemia - chronic disease (normocytic)  Hgb Stable   DVT prophylaxis: warfarin Code Status: full code Family Communication: none at bedside Disposition Plan: Home    Consultants:   ID  Procedures:   None   Antimicrobials: Anti-infectives    Start     Dose/Rate Route Frequency Ordered Stop   08/12/17 2200  ceFAZolin (ANCEF) IVPB 2g/100 mL premix     2 g 200 mL/hr over 30 Minutes Intravenous Every 8 hours 08/12/17 1812     08/12/17 2000  rifampin (RIFADIN) capsule 300 mg     300 mg Oral Every 8 hours 08/12/17 1751     08/12/17 1900  gentamicin (GARAMYCIN) IVPB 80 mg     80 mg 100 mL/hr over 30 Minutes Intravenous Every  12 hours 08/12/17 1812 08/21/17 2030      Objective: Vitals:   08/24/17 2243 08/25/17 0521 08/25/17 0523 08/25/17 1415  BP:  (!) 117/42 (!) 117/42 (!) 93/49  Pulse:  90 90 74  Resp:  18 18 18   Temp: 99.5 F (37.5 C) 98.4 F (36.9 C) 98.4 F (36.9 C) 98.5 F (36.9 C)  TempSrc: Oral Oral Oral Oral  SpO2:  100% 100% 100%  Weight:      Height:        Intake/Output Summary (Last 24 hours) at 08/25/17 1611 Last data filed at 08/25/17 3612  Gross per 24 hour  Intake              200 ml  Output                0 ml  Net              200 ml   Filed Weights   08/12/17 1528 08/12/17 1801  Weight: 81.6 kg (180 lb) 76.1 kg (167 lb 11.2 oz)    Examination: No changes on physical exam   General: Pt is alert, awake, not in acute distress Cardiovascular: RRR, S1/S2 + systolic murmur, no rubs, no gallops. No JVD  Respiratory: CTA bilaterally, no wheezing, no rhonchi Abdominal: Soft, NT, ND, bowel sounds + Extremities: no edema, no cyanosis  Data Reviewed: I have personally reviewed following labs and imaging studies  CBC:  Recent Labs Lab 08/23/17 0440 08/24/17 0423  WBC 8.9 8.5  HGB 9.9* 9.9*  HCT 31.4* 31.4*  MCV 82.6 82.6  PLT 292 279   Basic Metabolic Panel:  Recent Labs Lab 08/21/17 0441 08/24/17 0423  NA 134* 135  K 4.3 3.9  CL 99* 101  CO2 27 27  GLUCOSE 99 114*  BUN 15 15  CREATININE 1.07 0.90  CALCIUM 8.9 8.8*   GFR: Estimated Creatinine Clearance: 130.4 mL/min (by C-G formula based on SCr of 0.9 mg/dL). Liver Function Tests: No results for input(s): AST, ALT, ALKPHOS, BILITOT, PROT, ALBUMIN in the last 168 hours. No results for input(s): LIPASE, AMYLASE in the last 168 hours. No results for input(s): AMMONIA in the last 168 hours. Coagulation Profile:  Recent Labs Lab 08/21/17 0441 08/22/17 0430 08/23/17 0440 08/24/17 0423 08/25/17 0435  INR 2.29 2.15 2.10 2.06 2.25   Cardiac Enzymes: No results for input(s): CKTOTAL, CKMB, CKMBINDEX,  TROPONINI in the last 168 hours. BNP (last 3 results) No results for input(s): PROBNP in the last 8760 hours. HbA1C: No results for input(s): HGBA1C in the last 72 hours. CBG: No results for input(s): GLUCAP in the last 168 hours. Lipid Profile: No results for input(s): CHOL, HDL, LDLCALC, TRIG, CHOLHDL, LDLDIRECT in the last 72 hours. Thyroid Function Tests: No results for input(s): TSH, T4TOTAL, FREET4, T3FREE, THYROIDAB in the last 72 hours. Anemia Panel: No results for input(s): VITAMINB12, FOLATE, FERRITIN, TIBC, IRON, RETICCTPCT in the last 72 hours. Sepsis Labs: No results for input(s): PROCALCITON, LATICACIDVEN in the last 168 hours.  No results found for this or any previous visit (from the past 240 hour(s)).    Radiology Studies: No results found.    Scheduled Meds: . buprenorphine-naloxone  2 tablet Sublingual Daily  . carvedilol  6.25 mg Oral BID WC  . gabapentin  300 mg Oral BID  . nicotine  21 mg Transdermal Daily  . rifampin  300 mg Oral Q8H  . sodium chloride flush  10-40 mL Intracatheter Q12H  . warfarin  15 mg Oral ONCE-1800  . Warfarin - Pharmacist Dosing Inpatient   Does not apply q1800   Continuous Infusions: .  ceFAZolin (ANCEF) IV 2 g (08/25/17 1424)     LOS: 13 days    Time spent: Total of 15 minutes spent with pt, greater than 50% of which was spent in discussion of  treatment, counseling and coordination of care   Latrelle Dodrill, MD Pager: Text Page via [email or web address]   If 7PM-7AM, please contact night-coverage [email or web address] 08/25/2017, 4:11 PM

## 2017-08-25 NOTE — Progress Notes (Signed)
ANTICOAGULATION CONSULT NOTE - Follow Up Consult  Pharmacy Consult for Warfarin Indication: afib/PE   Patient Measurements: Height: 6' (182.9 cm) Weight: 167 lb 11.2 oz (76.1 kg) IBW/kg (Calculated) : 77.6   Vital Signs: Temp: 98.4 F (36.9 C) (09/11 0523) Temp Source: Oral (09/11 0523) BP: 117/42 (09/11 0523) Pulse Rate: 90 (09/11 0523)  Labs:  Recent Labs  08/23/17 0440 08/24/17 0423 08/25/17 0435  HGB 9.9* 9.9*  --   HCT 31.4* 31.4*  --   PLT 292 279  --   LABPROT 23.4* 23.0* 24.7*  INR 2.10 2.06 2.25  CREATININE  --  0.90  --     Estimated Creatinine Clearance: 130.4 mL/min (by C-G formula based on SCr of 0.9 mg/dL).   Assessment: 31 y.o. male admitted for MSSA endocarditis on warfarin PTA for history of afib/PE. The INR is therapeutic this morning at 2.25. He is on rifampin (inducer) leading to need for increased doses from home regimen of 8mg  PO daily.  Hgb and Plts stable, no reports of bleeding.   Goal of Therapy:  INR 2-3 Monitor platelets by anticoagulation protocol: Yes   Plan:  Warfarin 15mg  x1 Daily INR/CBC Continue to monitor for s/sx of bleeding   Thank you for allowing [place] to participate in this patients care.  Signe Colt, PharmD Clinical phone for 08/25/2017 from 7a-3:30p: x [phone] If after 3:30p, please call main pharmacy at: [phone] 08/25/2017 8:31 AM

## 2017-08-26 LAB — BASIC METABOLIC PANEL
Anion gap: 5 (ref 5–15)
BUN: 12 mg/dL (ref 6–20)
CO2: 28 mmol/L (ref 22–32)
Calcium: 8.8 mg/dL — ABNORMAL LOW (ref 8.9–10.3)
Chloride: 102 mmol/L (ref 101–111)
Creatinine, Ser: 0.94 mg/dL (ref 0.61–1.24)
GFR calc Af Amer: >60 mL/min (ref >60)
GLUCOSE: 101 mg/dL — AB (ref 65–99)
POTASSIUM: 4.3 mmol/L (ref 3.5–5.1)
Sodium: 135 mmol/L (ref 135–145)

## 2017-08-26 LAB — CBC WITH DIFFERENTIAL/PLATELET
BASOS ABS: 0 10*3/uL (ref 0.0–0.1)
Basophils Relative: 0 %
Eosinophils Absolute: 0.2 10*3/uL (ref 0.0–0.7)
Eosinophils Relative: 3 %
HCT: 29.8 % — ABNORMAL LOW (ref 39.0–52.0)
Hemoglobin: 9.4 g/dL — ABNORMAL LOW (ref 13.0–17.0)
Lymphocytes Relative: 23 %
Lymphs Abs: 1.7 10*3/uL (ref 0.7–4.0)
MCH: 26.3 pg (ref 26.0–34.0)
MCHC: 31.5 g/dL (ref 30.0–36.0)
MCV: 83.2 fL (ref 78.0–100.0)
MONO ABS: 1 10*3/uL (ref 0.1–1.0)
Monocytes Relative: 14 %
NEUTROS ABS: 4.2 10*3/uL (ref 1.7–7.7)
Neutrophils Relative %: 60 %
PLATELETS: 238 10*3/uL (ref 150–400)
RBC: 3.58 MIL/uL — AB (ref 4.22–5.81)
RDW: 15.2 % (ref 11.5–15.5)
WBC: 7.1 10*3/uL (ref 4.0–10.5)

## 2017-08-26 LAB — PROTIME-INR
INR: 2.59
Prothrombin Time: 27.6 seconds — ABNORMAL HIGH (ref 11.4–15.2)

## 2017-08-26 MED ORDER — WARFARIN SODIUM 7.5 MG PO TABS
12.5000 mg | ORAL_TABLET | Freq: Once | ORAL | Status: AC
  Administered 2017-08-26: 17:00:00 12.5 mg via ORAL
  Filled 2017-08-26: qty 1

## 2017-08-26 NOTE — Progress Notes (Signed)
PROGRESS NOTE    Norman Reed  ZOX:[id number] DOB: Jan 29, 1986 DOA: 08/12/2017 PCP: [name] [name], MD    Brief Narrative:  31 year old male percent of fevers. Patient is known to have MSSA endocarditis, chronic systolic heart failure,, hepatitis C, substance abuse, pulmonary embolism. Patient recently hospitalized for endocarditis, left AGAINST MEDICAL ADVICE, he was placed on linezolid and rifampin oral, patient developedrecurrent fevers and generalize malaise. On his initial physical examination blood pressure 150/63, heart rate 87, respiratory 21, oxygen saturation 100%. Moist mucous membranes, lungs were clear discussion bilaterally, no wheezing rales or rhonchi, heart with S1-S2 present and rhythmic, no murmurs, rubs or gallops, the abdomen was soft nontender, no lower extremity edema.   Patient was admitted to hospital with MSSA endocarditis.    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 MSSA endocarditis. On cefazolin and rifampin IV, with good toleration.  2. Chronic stable systolic heart failure. On carvedilol, clinically euvolemic.    3. Tobacco abuse. Smoking cessation, with nicotine patch   4. Anemia. Stable.   5. Pulmonary embolism. INR at target 2,59, will continue warfarin therapy.   6. Chronic pain syndrome. Continue ibuprofen, tramadol, and suboxone.    DVT prophylaxis:warfarin Code Status:full Family Communication: Disposition Plan:home    Consultants:  I.D  Procedures:    Antimicrobials:      Subjective: Patient feeling well, no nausea or vomiting. No chest pain, or dyspnea  Objective: Vitals:   08/25/17 2158 08/26/17 0627 08/26/17 0858 08/26/17 1612  BP: (!) 118/53  (!) 97/40 (!) 102/46 (!) 102/50  Pulse: 81 78  79  Resp: Temp: 98.4 F (36.9 C) 98.2 F (36.8 C)  99.9 F (37.7 C)  TempSrc: Oral Oral  Oral  SpO2: 100% 100%  100%  Weight:      Height:        Intake/Output Summary (Last 24 hours) at 08/26/17 1708 Last data filed at 08/26/17 1611  Gross per 24 hour  Intake              898 ml  Output                0 ml  Net              898 ml   Filed Weights   08/12/17 1528 08/12/17 1801  Weight: 81.6 kg (180 lb) 76.1 kg (167 lb 11.2 oz)    Examination:  General: Not in pain or dyspnea Neurology: Awake and alert, non focal  E ENT: no pallor, no icterus, oral mucosa moist Cardiovascular: S1-S2 present, rhythmic, no gallops, rubs, or murmurs. No jugular venous distention, no lower extremity edema. Pulmonary: vesicular breath sounds bilaterally, adequate air movement, no wheezing, rhonchi or rales. Gastrointestinal. Abdomen flat, no organomegaly, non tender, no rebound or guarding Skin. No rashes Musculoskeletal: no joint deformities     Data Reviewed: I have personally reviewed following labs and imaging studies  CBC:  Recent Labs Lab 08/23/17 0440 08/24/17 0423 08/26/17 0421  WBC 8.9 8.5 7.1  NEUTROABS  --   --  4.2  HGB 9.9* 9.9* 9.4*  HCT 31.4* 31.4* 29.8*  MCV 82.6 82.6 83.2  PLT 292 279 238   Basic Metabolic Panel:  Recent Labs Lab 08/21/17 0441  08/24/17 0423 08/26/17 0421  NA 134* 135 135  K 4.3 3.9 4.3  CL 99* 101 102  CO2 27 27 28   GLUCOSE 99 114* 101*  BUN 15 15 12   CREATININE 1.07 0.90 0.94  CALCIUM 8.9 8.8* 8.8*   GFR: Estimated Creatinine Clearance: 124.8 mL/min (by C-G formula based on SCr of 0.94 mg/dL). Liver Function Tests: No results for input(s): AST, ALT, ALKPHOS, BILITOT, PROT, ALBUMIN in the last 168 hours. No results for input(s): LIPASE, AMYLASE in the last 168 hours. No results for input(s): AMMONIA in the last 168 hours. Coagulation Profile:  Recent Labs Lab  08/22/17 0430 08/23/17 0440 08/24/17 0423 08/25/17 0435 08/26/17 0421  INR 2.15 2.10 2.06 2.25 2.59   Cardiac Enzymes: No results for input(s): CKTOTAL, CKMB, CKMBINDEX, TROPONINI in the last 168 hours. BNP (last 3 results) No results for input(s): PROBNP in the last 8760 hours. HbA1C: No results for input(s): HGBA1C in the last 72 hours. CBG: No results for input(s): GLUCAP in the last 168 hours. Lipid Profile: No results for input(s): CHOL, HDL, LDLCALC, TRIG, CHOLHDL, LDLDIRECT in the last 72 hours. Thyroid Function Tests: No results for input(s): TSH, T4TOTAL, FREET4, T3FREE, THYROIDAB in the last 72 hours. Anemia Panel: No results for input(s): VITAMINB12, FOLATE, FERRITIN, TIBC, IRON, RETICCTPCT in the last 72 hours.    Radiology Studies: I have reviewed all of the imaging during this hospital visit personally     Scheduled Meds: . buprenorphine-naloxone  2 tablet Sublingual Daily  . carvedilol  6.25 mg Oral BID WC  . gabapentin  300 mg Oral BID  . nicotine  21 mg Transdermal Daily  . rifampin  300 mg Oral Q8H  . sodium chloride flush  10-40 mL Intracatheter Q12H  . warfarin  12.5 mg Oral ONCE-1800  . Warfarin - Pharmacist Dosing Inpatient   Does not apply q1800   Continuous Infusions: .  ceFAZolin (ANCEF) IV Stopped (08/26/17 1433)     LOS: 14 days        Ashleyanne Hemmingway [name], MD [facility] Pager [phone]

## 2017-08-26 NOTE — Progress Notes (Signed)
ANTICOAGULATION CONSULT NOTE - Follow Up Consult  Pharmacy Consult for Warfarin Indication: afib/PE   Patient Measurements: Height: 6' (182.9 cm) Weight: 167 lb 11.2 oz (76.1 kg) IBW/kg (Calculated) : 77.6   Vital Signs: Temp: 98.2 F (36.8 C) (09/12 0627) Temp Source: Oral (09/12 0627) BP: 102/46 (09/12 0858) Pulse Rate: 78 (09/12 0627)  Labs:  Recent Labs  08/24/17 0423 08/25/17 0435 08/26/17 0421  HGB 9.9*  --  9.4*  HCT 31.4*  --  29.8*  PLT 279  --  238  LABPROT 23.0* 24.7* 27.6*  INR 2.06 2.25 2.59  CREATININE 0.90  --  0.94    Estimated Creatinine Clearance: 124.8 mL/min (by C-G formula based on SCr of 0.94 mg/dL).   Assessment: 31 y.o. male admitted for MSSA endocarditis on warfarin PTA for history of afib/PE. The INR is therapeutic this morning at 2.59. He is on rifampin (inducer) leading to need for increased doses from home regimen of 8mg  PO daily.  Hgb and Plts stable, no reports of bleeding.   Goal of Therapy:  INR 2-3 Monitor platelets by anticoagulation protocol: Yes   Plan:  Warfarin 12.5mg  x1 Monitor daily INR, CBC, clinical course, s/sx of bleed, PO intake, DDI    Thank you for allowing [place] to participate in this patients care.  Signe Colt, PharmD Clinical phone for 08/26/2017 from 7a-3:30p: x [phone] If after 3:30p, please call main pharmacy at: [phone] 08/26/2017 9:10 AM

## 2017-08-27 LAB — PROTIME-INR
INR: 2.3
PROTHROMBIN TIME: 25.1 s — AB (ref 11.4–15.2)

## 2017-08-27 MED ORDER — WARFARIN SODIUM 7.5 MG PO TABS
15.0000 mg | ORAL_TABLET | Freq: Once | ORAL | Status: AC
  Administered 2017-08-27: 15 mg via ORAL
  Filled 2017-08-27: qty 2

## 2017-08-27 NOTE — Progress Notes (Signed)
PROGRESS NOTE    Norman Reed  ZOX:[id number] DOB: 1986/02/10 DOA: 08/12/2017 PCP: Norman Harrison Norman Coup, MD    Brief Narrative:  31 year old male percent of fevers. Patient is known to have MSSA endocarditis, chronic systolic heart failure,, hepatitis C, substance abuse, pulmonary embolism. Patient recently hospitalized for endocarditis, left AGAINST MEDICAL ADVICE, he was placed on linezolid and rifampin oral, patient developedrecurrent fevers and generalize malaise. On his initial physical examination blood pressure 150/63, heart rate 87, respiratory 21, oxygen saturation 100%. Moist mucous membranes, lungs were clear discussion bilaterally, no wheezing rales or rhonchi, heart with S1-S2 present and rhythmic, no murmurs, rubs or gallops, the abdomen was soft nontender, no lower extremity edema.   Patient was admitted to hospital with MSSA endocarditis.    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 MSSA endocarditis. On cefazolin and rifampin IV,   2. Chronic stable systolic heart failure. Holding coreg due to risk of hypotension   3. Tobacco abuse. Continue Smoking cessation, with nicotine patch   4. Pulmonary embolism.INR at target 2,30, on warfarin  5. Chronic pain syndrome. Onibuprofen, tramadol, and suboxone.    DVT prophylaxis:warfarin Code Status:full Family Communication: Disposition Plan:home    Consultants:  I.D  Procedures:    Antimicrobials:   Subjective: Patient feeling well, no new complains.   Objective: Vitals:   08/26/17 1722 08/26/17 2241 08/27/17 0411 08/27/17 0558  BP: (!) 108/42 (!) 113/54  (!) 113/49  Pulse:  89  85  Resp:  17  17  Temp:  99.9 F (37.7 C) 98.9 F (37.2 C)  98.5 F (36.9 C)  TempSrc:  Oral Oral Oral  SpO2:  100%  98%  Weight:      Height:        Intake/Output Summary (Last 24 hours) at 08/27/17 1325 Last data filed at 08/27/17 0411  Gross per 24 hour  Intake              464 ml  Output                0 ml  Net              464 ml   Filed Weights   08/12/17 1528 08/12/17 1801  Weight: 81.6 kg (180 lb) 76.1 kg (167 lb 11.2 oz)    Examination:  General: Not in pain or dyspnea Neurology: Awake and alert, non focal  E ENT: no pallor, no icterus, oral mucosa moist Cardiovascular: S1-S2 present, rhythmic, no gallops, rubs,., positive systolic murmurs. No jugular venous distention, no lower extremity edema. Pulmonary: vesicular breath sounds bilaterally, adequate air movement, no wheezing, rhonchi or rales. Gastrointestinal. Abdomen flat, no organomegaly, non tender, no rebound or guarding Skin. No rashes Musculoskeletal: no joint deformities     Data Reviewed: I have personally reviewed following labs and imaging studies  CBC:  Recent Labs Lab 08/23/17 0440 08/24/17 0423 08/26/17 0421  WBC 8.9 8.5 7.1  NEUTROABS  --   --  4.2  HGB 9.9* 9.9* 9.4*  HCT 31.4* 31.4* 29.8*  MCV 82.6 82.6 83.2  PLT 292 279 238   Basic Metabolic Panel:  Recent Labs Lab 08/21/17 0441 08/24/17 0423 08/26/17 0421  NA 134* 135 135  K 4.3 3.9 4.3  CL 99*  101 102  CO2 27 27 28   GLUCOSE 99 114* 101*  BUN 15 15 12   CREATININE 1.07 0.90 0.94  CALCIUM 8.9 8.8* 8.8*   GFR: Estimated Creatinine Clearance: 124.8 mL/min (by C-G formula based on SCr of 0.94 mg/dL). Liver Function Tests: No results for input(s): AST, ALT, ALKPHOS, BILITOT, PROT, ALBUMIN in the last 168 hours. No results for input(s): LIPASE, AMYLASE in the last 168 hours. No results for input(s): AMMONIA in the last 168 hours. Coagulation Profile:  Recent Labs Lab 08/23/17 0440 08/24/17 0423 08/25/17 0435 08/26/17 0421 08/27/17 0356  INR 2.10 2.06 2.25 2.59 2.30    Cardiac Enzymes: No results for input(s): CKTOTAL, CKMB, CKMBINDEX, TROPONINI in the last 168 hours. BNP (last 3 results) No results for input(s): PROBNP in the last 8760 hours. HbA1C: No results for input(s): HGBA1C in the last 72 hours. CBG: No results for input(s): GLUCAP in the last 168 hours. Lipid Profile: No results for input(s): CHOL, HDL, LDLCALC, TRIG, CHOLHDL, LDLDIRECT in the last 72 hours. Thyroid Function Tests: No results for input(s): TSH, T4TOTAL, FREET4, T3FREE, THYROIDAB in the last 72 hours. Anemia Panel: No results for input(s): VITAMINB12, FOLATE, FERRITIN, TIBC, IRON, RETICCTPCT in the last 72 hours.    Radiology Studies: I have reviewed all of the imaging during this hospital visit personally     Scheduled Meds: . buprenorphine-naloxone  2 tablet Sublingual Daily  . gabapentin  300 mg Oral BID  . nicotine  21 mg Transdermal Daily  . rifampin  300 mg Oral Q8H  . sodium chloride flush  10-40 mL Intracatheter Q12H  . warfarin  15 mg Oral ONCE-1800  . Warfarin - Pharmacist Dosing Inpatient   Does not apply q1800   Continuous Infusions: .  ceFAZolin (ANCEF) IV Stopped (08/27/17 8889)     LOS: 15 days        Norman Nation Norman Gula, MD [facility] Pager [phone]

## 2017-08-27 NOTE — Progress Notes (Signed)
Pharmacy Antibiotic Note--Follow up  Norman Reed is a 31 y.o. male admitted on 08/12/2017 with endocarditis.  Pharmacy has been consulted for cefazolin dosing. Patient had fever x 1 on 9/11 but has remained afebrile since and WBC count has remained within normal limits.   Plan: -Continue cefazolin 2 g IV q8h -Continue Rifampin 300 mg PO q8h -Monitor clinical progress  Height: 6' (182.9 cm) Weight: 167 lb 11.2 oz (76.1 kg) IBW/kg (Calculated) : 77.6  Temp (24hrs), Avg:99.3 F (37.4 C), Min:98.5 F (36.9 C), Max:99.9 F (37.7 C)   Recent Labs Lab 08/21/17 0441 08/23/17 0440 08/24/17 0423 08/26/17 0421  WBC  --  8.9 8.5 7.1  CREATININE 1.07  --  0.90 0.94    Estimated Creatinine Clearance: 124.8 mL/min (by C-G formula based on SCr of 0.94 mg/dL).    Allergies  Allergen Reactions  . Penicillins Other (See Comments)    Childhood reaction    Antimicrobials this admission: . 8/29 gentamicin >> 9/7 . 8/29 Ancef  >> 9/25 . 8/29 rifampin >> 9/25  Microbiology results: 8/29 Blood Cx x2 > NGTD     Thank you for allowing [place] to participate in this patients care.  Signe Colt, PharmD Clinical phone for 08/27/2017 from 7a-3:30p: x [phone] If after 3:30p, please call main pharmacy at: [phone] 08/27/2017 8:19 AM

## 2017-08-27 NOTE — Progress Notes (Signed)
ANTICOAGULATION CONSULT NOTE - Follow Up Consult  Pharmacy Consult for Warfarin Indication: afib/PE   Patient Measurements: Height: 6' (182.9 cm) Weight: 167 lb 11.2 oz (76.1 kg) IBW/kg (Calculated) : 77.6   Vital Signs: Temp: 98.5 F (36.9 C) (09/13 0558) Temp Source: Oral (09/13 0558) BP: 113/49 (09/13 0558) Pulse Rate: 85 (09/13 0558)  Labs:  Recent Labs  08/25/17 0435 08/26/17 0421 08/27/17 0356  HGB  --  9.4*  --   HCT  --  29.8*  --   PLT  --  238  --   LABPROT 24.7* 27.6* 25.1*  INR 2.25 2.59 2.30  CREATININE  --  0.94  --     Estimated Creatinine Clearance: 124.8 mL/min (by C-G formula based on SCr of 0.94 mg/dL).   Assessment: 31 y.o. male admitted for MSSA endocarditis on warfarin PTA for history of afib/PE. The INR remains therapeutic this morning at 2.3. He is on rifampin (inducer) leading to need for increased doses from home regimen of 8mg  PO daily.  Hgb and Plts stable, no reports of bleeding.   Goal of Therapy:  INR 2-3 Monitor platelets by anticoagulation protocol: Yes   Plan:  Warfarin 15 mg x1 Monitor daily INR, CBC, clinical course, s/sx of bleed, PO intake, DDI    Thank you for allowing [place] to participate in this patients care.  Signe Colt, PharmD Clinical phone for 08/27/2017 from 7a-3:30p: x [phone] If after 3:30p, please call main pharmacy at: [phone] 08/27/2017 8:15 AM

## 2017-08-28 LAB — PROTIME-INR
INR: 2.05
PROTHROMBIN TIME: 23 s — AB (ref 11.4–15.2)

## 2017-08-28 MED ORDER — WARFARIN SODIUM 7.5 MG PO TABS
15.0000 mg | ORAL_TABLET | Freq: Once | ORAL | Status: AC
  Administered 2017-08-28: 15 mg via ORAL
  Filled 2017-08-28: qty 2

## 2017-08-28 MED ORDER — WARFARIN - PHARMACIST DOSING INPATIENT
Freq: Every day | Status: DC
  Administered 2017-08-28 – 2017-09-07 (×6)

## 2017-08-28 NOTE — Progress Notes (Signed)
ANTICOAGULATION CONSULT NOTE - Follow Up Consult  Pharmacy Consult for Warfarin Indication: afib/PE   Patient Measurements: Height: 6' (182.9 cm) Weight: 167 lb 11.2 oz (76.1 kg) IBW/kg (Calculated) : 77.6   Vital Signs: Temp: 98.2 F (36.8 C) (09/14 0532) Temp Source: Oral (09/14 0532) BP: 106/55 (09/14 0532) Pulse Rate: 81 (09/14 0532)  Labs:  Recent Labs  08/26/17 0421 08/27/17 0356 08/28/17 0432  HGB 9.4*  --   --   HCT 29.8*  --   --   PLT 238  --   --   LABPROT 27.6* 25.1* 23.0*  INR 2.59 2.30 2.05  CREATININE 0.94  --   --     Estimated Creatinine Clearance: 124.8 mL/min (by C-G formula based on SCr of 0.94 mg/dL).   Assessment: 31 y.o. male admitted for MSSA endocarditis on warfarin PTA for history of afib/PE. The INR remains therapeutic this morning at 2.05. He is on rifampin (inducer) leading to need for increased doses from home regimen of 8mg  PO daily.  Hgb and Plts stable, no reports of bleeding.    Goal of Therapy:  INR 2-3 Monitor platelets by anticoagulation protocol: Yes   Plan:  Repeat warfarin 15 mg x1 Monitor daily INR, CBC, clinical course, s/sx of bleed, PO intake, DDI    Thank you for allowing [place] to participate in this patients care.  Signe Colt, PharmD Clinical phone for 08/28/2017 from 7a-3:30p: x [phone] If after 3:30p, please call main pharmacy at: [phone] 08/28/2017 8:05 AM

## 2017-08-28 NOTE — Progress Notes (Signed)
PROGRESS NOTE    Norman Reed  UKG:[id number] DOB: December 23, 1985 DOA: 08/12/2017 PCP: [name] [name], MD    Brief Narrative:  31 year old male percent of fevers. Patient is known to have MSSA endocarditis, chronic systolic heart failure,, hepatitis C, substance abuse, pulmonary embolism. Patient recently hospitalized for endocarditis, left AGAINST MEDICAL ADVICE, he was placed on linezolid and rifampin oral, patient developedrecurrent fevers and generalize malaise. On his initial physical examination blood pressure 150/63, heart rate 87, respiratory 21, oxygen saturation 100%. Moist mucous membranes, lungs were clear discussion bilaterally, no wheezing rales or rhonchi, heart with S1-S2 present and rhythmic, no murmurs, rubs or gallops, the abdomen was soft nontender, no lower extremity edema.   Patient was admitted to hospital with MSSA endocarditis.     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 MSSA endocarditis. Continue with cefazolin and rifampin IV,   2. Chronic stable systolic heart failure. No coreg.   3. Tobacco abuse. Smoking cessation, with nicotine patch   4. Pulmonary embolism.INR at target 2,0 on warfarin per pharmacy protocol.   5. Chronic pain syndrome. Tolerating well ibuprofen, tramadol, and suboxone.    DVT prophylaxis:warfarin Code Status:full Family Communication: Disposition Plan:home    Consultants:  I.D  Procedures:    Antimicrobials:    Subjective: Patient is feeling well. No fever or chills.   Objective: Vitals:   08/27/17 0411 08/27/17 0558 08/27/17 1359 08/28/17 0532  BP:  (!) 113/49 (!) 108/44 (!) 106/55  Pulse:  85 79 81  Resp:  17 16 18   Temp: 98.9 F (37.2 C)  98.5 F (36.9 C) 98.8 F (37.1 C) 98.2 F (36.8 C)  TempSrc: Oral Oral Oral Oral  SpO2:  98% 100% 100%  Weight:      Height:        Intake/Output Summary (Last 24 hours) at 08/28/17 1241 Last data filed at 08/28/17 0600  Gross per 24 hour  Intake             1140 ml  Output                0 ml  Net             1140 ml   Filed Weights   08/12/17 1528 08/12/17 1801  Weight: 81.6 kg (180 lb) 76.1 kg (167 lb 11.2 oz)    Examination:  General: Not in pain or dyspnea Neurology: Awake and alert, non focal  E ENT: no pallor, no icterus, oral mucosa moist Cardiovascular: No JVD. S1-S2 present, rhythmic, no gallops, rubs, or murmurs. No jugular venous distention, no lower extremity edema. Pulmonary: vesicular breath sounds bilaterally, adequate air movement, no wheezing, rhonchi or rales. Gastrointestinal. Abdomen flat, no organomegaly, non tender, no rebound or guarding Skin. No rashes Musculoskeletal: no joint deformities     Data Reviewed: I have personally reviewed following labs and imaging studies  CBC:  Recent Labs Lab 08/23/17 0440 08/24/17 0423 08/26/17 0421  WBC 8.9 8.5 7.1  NEUTROABS  --   --  4.2  HGB 9.9* 9.9* 9.4*  HCT 31.4* 31.4* 29.8*  MCV 82.6 82.6 83.2  PLT 292 279 238   Basic Metabolic Panel:  Recent Labs Lab 08/24/17 0423 08/26/17 0421  NA 135 135  K 3.9 4.3  CL 101 102  CO2 27 28  GLUCOSE 114* 101*  BUN 15 12  CREATININE 0.90 0.94  CALCIUM 8.8* 8.8*   GFR: Estimated Creatinine Clearance: 124.8 mL/min (by C-G formula based on SCr of 0.94 mg/dL). Liver Function Tests: No results for input(s): AST, ALT, ALKPHOS, BILITOT, PROT, ALBUMIN in the last 168 hours. No results for input(s): LIPASE, AMYLASE in the last 168 hours. No results for input(s): AMMONIA in the last 168 hours. Coagulation Profile:  Recent Labs Lab 08/24/17 0423 08/25/17 0435 08/26/17 0421 08/27/17 0356 08/28/17 0432  INR 2.06 2.25 2.59 2.30 2.05   Cardiac  Enzymes: No results for input(s): CKTOTAL, CKMB, CKMBINDEX, TROPONINI in the last 168 hours. BNP (last 3 results) No results for input(s): PROBNP in the last 8760 hours. HbA1C: No results for input(s): HGBA1C in the last 72 hours. CBG: No results for input(s): GLUCAP in the last 168 hours. Lipid Profile: No results for input(s): CHOL, HDL, LDLCALC, TRIG, CHOLHDL, LDLDIRECT in the last 72 hours. Thyroid Function Tests: No results for input(s): TSH, T4TOTAL, FREET4, T3FREE, THYROIDAB in the last 72 hours. Anemia Panel: No results for input(s): VITAMINB12, FOLATE, FERRITIN, TIBC, IRON, RETICCTPCT in the last 72 hours.    Radiology Studies: I have reviewed all of the imaging during this hospital visit personally     Scheduled Meds: . buprenorphine-naloxone  2 tablet Sublingual Daily  . gabapentin  300 mg Oral BID  . nicotine  21 mg Transdermal Daily  . rifampin  300 mg Oral Q8H  . sodium chloride flush  10-40 mL Intracatheter Q12H  . Warfarin - Pharmacist Dosing Inpatient   Does not apply q1800   Continuous Infusions: .  ceFAZolin (ANCEF) IV Stopped (08/28/17 0601)     LOS: 16 days        Thuan Tippett [name], MD [facility] Pager [phone]

## 2017-08-29 LAB — PROTIME-INR
INR: 2.24
Prothrombin Time: 24.6 seconds — ABNORMAL HIGH (ref 11.4–15.2)

## 2017-08-29 MED ORDER — WARFARIN SODIUM 7.5 MG PO TABS
15.0000 mg | ORAL_TABLET | Freq: Once | ORAL | Status: AC
  Administered 2017-08-29: 15 mg via ORAL
  Filled 2017-08-29: qty 2

## 2017-08-29 NOTE — Progress Notes (Signed)
ANTICOAGULATION CONSULT NOTE - Follow Up Consult  Pharmacy Consult for Warfarin Indication: afib/PE   Patient Measurements: Height: 6' (182.9 cm) Weight: 167 lb 11.2 oz (76.1 kg) IBW/kg (Calculated) : 77.6   Vital Signs: Temp: 97.8 F (36.6 C) (09/15 0505) Temp Source: Oral (09/15 0505) BP: 100/51 (09/15 0505) Pulse Rate: 81 (09/15 0505)  Labs:  Recent Labs  08/27/17 0356 08/28/17 0432 08/29/17 0433  LABPROT 25.1* 23.0* 24.6*  INR 2.30 2.05 2.24    Estimated Creatinine Clearance: 124.8 mL/min (by C-G formula based on SCr of 0.94 mg/dL).   Assessment: 31 y.o. male admitted for MSSA endocarditis on warfarin PTA for history of afib/PE. INR of 2.24 remains therapeutic. He is on rifampin (inducer) leading to need for increased doses from home regimen of 8mg  PO daily.  CBC low but stable, no reports of bleeding at this time.  Goal of Therapy:  INR 2-3 Monitor platelets by anticoagulation protocol: Yes   Plan:  Warfarin 15mg  PO x 1 Monitor daily INR, CBC, clinical course, s/sx of bleed, PO intake, DDI    Daylene Posey, PharmD Pharmacy Resident Pager #: [phone] 08/29/2017 8:54 AM

## 2017-08-29 NOTE — Progress Notes (Signed)
PROGRESS NOTE    Norman Reed  ZOX:[id number] DOB: [birth year] DOA: 08/12/2017 PCP: [name] [name], MD    Brief Narrative:  31 year old male percent of fevers. Patient is known to have MSSA endocarditis, chronic systolic heart failure,, hepatitis C, substance abuse, pulmonary embolism. Patient recently hospitalized for endocarditis, left AGAINST MEDICAL ADVICE, he was placed on linezolid and rifampin oral, patient developedrecurrent fevers and generalize malaise. On his initial physical examination blood pressure 150/63, heart rate 87, respiratory 21, oxygen saturation 100%. Moist mucous membranes, lungs were clear discussion bilaterally, no wheezing rales or rhonchi, heart with S1-S2 present and rhythmic, no murmurs, rubs or gallops, the abdomen was soft nontender, no lower extremity edema.   Patient was admitted to hospital with MSSA endocarditis.    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 MSSA endocarditis. Cefazolin and rifampin IV, to complete on September 25th. Patient failed outpatient therapy with oral agents.   2. Chronic stable systolic heart failure. Stable, off medications.   3. Tobacco abuse. On nicotine patch   4. Pulmonary embolism.INR at target 2,24, follow INR levels per pharmacy protocol  5. Chronic pain syndrome. On ibuprofen, tramadol, and suboxone.    DVT prophylaxis:warfarin Code Status:full Family Communication: Disposition Plan:home    Consultants:  I.D  Procedures:    Antimicrobials:    Subjective: Patient is feeling well. No fever or chills.   Subjective: Patient feeling well, no nausea or vomiting.   Objective: Vitals:   08/27/17 1359 08/28/17 0532 08/28/17  2122 08/29/17 0505  BP: (!) 108/44 (!) 106/55 114/60 (!) 100/51  Pulse: 79 81 90 81  Resp: Temp: 98.8 F (37.1 C) 98.2 F (36.8 C) 98.5 F (36.9 C) 97.8 F (36.6 C)  TempSrc: Oral Oral Oral Oral  SpO2: 100% 100% 98% 100%  Weight:      Height:       No intake or output data in the 24 hours ending 08/29/17 1549 Filed Weights   08/12/17 1528 08/12/17 1801  Weight: 81.6 kg (180 lb) 76.1 kg (167 lb 11.2 oz)    Examination:  General: Not in pain or dyspnea Neurology: Awake and alert, non focal  E ENT: no pallor, no icterus, oral mucosa moist Cardiovascular: No JVD. S1-S2 present, rhythmic, no gallops, rubs, or murmurs. No jugular venous distention, no lower extremity edema. Pulmonary: vesicular breath sounds bilaterally, adequate air movement, no wheezing, rhonchi or rales. Gastrointestinal. Abdomen flat, no organomegaly, non tender, no rebound or guarding Skin. No rashes Musculoskeletal: no joint deformities     Data Reviewed: I have personally reviewed following labs and imaging studies  CBC:  Recent Labs Lab 08/23/17 0440 08/24/17 0423 08/26/17 0421  WBC 8.9 8.5 7.1  NEUTROABS  --   --  4.2  HGB 9.9* 9.9* 9.4*  HCT 31.4* 31.4* 29.8*  MCV 82.6 82.6 83.2  PLT 292 279 238   Basic Metabolic Panel:  Recent Labs Lab 08/24/17 0423 08/26/17 0421  NA 135 135  K 3.9 4.3  CL 101 102  CO2 27 28  GLUCOSE 114* 101*  BUN 15 12  CREATININE 0.90 0.94  CALCIUM 8.8* 8.8*   GFR: Estimated Creatinine Clearance: 124.8 mL/min (by C-G formula based on SCr of 0.94 mg/dL). Liver Function Tests:  No results for input(s): AST, ALT, ALKPHOS, BILITOT, PROT, ALBUMIN in the last 168 hours. No results for input(s): LIPASE, AMYLASE in the last 168 hours. No results for input(s): AMMONIA in the last 168 hours. Coagulation Profile:  Recent Labs Lab 08/25/17 0435 08/26/17 0421 08/27/17 0356 08/28/17 0432 08/29/17 0433  INR 2.25 2.59 2.30 2.05 2.24   Cardiac  Enzymes: No results for input(s): CKTOTAL, CKMB, CKMBINDEX, TROPONINI in the last 168 hours. BNP (last 3 results) No results for input(s): PROBNP in the last 8760 hours. HbA1C: No results for input(s): HGBA1C in the last 72 hours. CBG: No results for input(s): GLUCAP in the last 168 hours. Lipid Profile: No results for input(s): CHOL, HDL, LDLCALC, TRIG, CHOLHDL, LDLDIRECT in the last 72 hours. Thyroid Function Tests: No results for input(s): TSH, T4TOTAL, FREET4, T3FREE, THYROIDAB in the last 72 hours. Anemia Panel: No results for input(s): VITAMINB12, FOLATE, FERRITIN, TIBC, IRON, RETICCTPCT in the last 72 hours.    Radiology Studies: I have reviewed all of the imaging during this hospital visit personally     Scheduled Meds: . buprenorphine-naloxone  2 tablet Sublingual Daily  . gabapentin  300 mg Oral BID  . nicotine  21 mg Transdermal Daily  . rifampin  300 mg Oral Q8H  . sodium chloride flush  10-40 mL Intracatheter Q12H  . warfarin  15 mg Oral ONCE-1800  . Warfarin - Pharmacist Dosing Inpatient   Does not apply q1800   Continuous Infusions: .  ceFAZolin (ANCEF) IV Stopped (08/29/17 1337)     LOS: 17 days        Garry Nicolini [name], MD [facility] Pager [phone]

## 2017-08-30 LAB — CBC
HEMATOCRIT: 31.5 % — AB (ref 39.0–52.0)
Hemoglobin: 9.9 g/dL — ABNORMAL LOW (ref 13.0–17.0)
MCH: 26.1 pg (ref 26.0–34.0)
MCHC: 31.4 g/dL (ref 30.0–36.0)
MCV: 82.9 fL (ref 78.0–100.0)
Platelets: 272 10*3/uL (ref 150–400)
RBC: 3.8 MIL/uL — ABNORMAL LOW (ref 4.22–5.81)
RDW: 15.4 % (ref 11.5–15.5)
WBC: 6.8 10*3/uL (ref 4.0–10.5)

## 2017-08-30 LAB — PROTIME-INR
INR: 1.87
PROTHROMBIN TIME: 21.3 s — AB (ref 11.4–15.2)

## 2017-08-30 MED ORDER — WARFARIN SODIUM 7.5 MG PO TABS
15.0000 mg | ORAL_TABLET | Freq: Once | ORAL | Status: AC
  Administered 2017-08-30: 15 mg via ORAL
  Filled 2017-08-30: qty 2

## 2017-08-30 MED ORDER — ONDANSETRON HCL 4 MG/2ML IJ SOLN
4.0000 mg | Freq: Once | INTRAMUSCULAR | Status: AC
  Administered 2017-08-30: 4 mg via INTRAVENOUS
  Filled 2017-08-30: qty 2

## 2017-08-30 NOTE — Progress Notes (Signed)
Pharmacy Antibiotic Note  Norman Reed is a 31 y.o. male admitted on 08/12/2017 with endocarditis.  Pharmacy has been consulted for cefazolin dosing.  Pt remains afebrile, WBC wnl and renal function is stable.    Plan: -Ancef 2g IV q8h thru 9/25 -Rifampin 300 mg PO q8h  thru 9/25 -monitor clinical progress  Height: 6' (182.9 cm) Weight: 167 lb 11.2 oz (76.1 kg) IBW/kg (Calculated) : 77.6  Temp (24hrs), Avg:99 F (37.2 C), Min:98.7 F (37.1 C), Max:99.3 F (37.4 C)   Recent Labs Lab 08/24/17 0423 08/26/17 0421 08/30/17 0432  WBC 8.5 7.1 6.8  CREATININE 0.90 0.94  --     Estimated Creatinine Clearance: 124.8 mL/min (by C-G formula based on SCr of 0.94 mg/dL).    Allergies  Allergen Reactions  . Penicillins Other (See Comments)    Childhood reaction    Antimicrobials this admission: . 8/29 gentamicin >> 9/7 . 8/29 Ancef  >> 9/25 . 8/29 rifampin >> 9/25  Dose adjustments this admission: 8/31 Gent trough: <0.5--completed   Microbiology results: 8/29 Blood cx x2 > negative  Daylene Posey, PharmD Pharmacy Resident Pager #: [phone] 08/30/2017 11:08 AM

## 2017-08-30 NOTE — Progress Notes (Signed)
ANTICOAGULATION CONSULT NOTE - Follow Up Consult  Pharmacy Consult for Warfarin Indication: afib/PE   Patient Measurements: Height: 6' (182.9 cm) Weight: 167 lb 11.2 oz (76.1 kg) IBW/kg (Calculated) : 77.6   Vital Signs: Temp: 99 F (37.2 C) (09/16 0500) Temp Source: Oral (09/16 0500) BP: 108/46 (09/16 0500) Pulse Rate: 81 (09/16 0500)  Labs:  Recent Labs  08/28/17 0432 08/29/17 0433 08/30/17 0432  HGB  --   --  9.9*  HCT  --   --  31.5*  PLT  --   --  272  LABPROT 23.0* 24.6* 21.3*  INR 2.05 2.24 1.87    Estimated Creatinine Clearance: 124.8 mL/min (by C-G formula based on SCr of 0.94 mg/dL).   Assessment: 31 y.o. male admitted for MSSA endocarditis on warfarin PTA for history of afib/PE. He is on rifampin (inducer) leading to need for increased doses from home regimen of 8mg  PO daily.   INR 1.87 subtherapeutic likley representative of lower dose given 9/12 having its effects today.    Goal of Therapy:  INR 2-3 Monitor platelets by anticoagulation protocol: Yes   Plan:  Warfarin 15mg  PO x 1 Monitor daily INR, CBC, clinical course, s/sx of bleed, PO intake, DDI    Daylene Posey, PharmD Pharmacy Resident Pager #: [phone] 08/30/2017 10:53 AM

## 2017-08-30 NOTE — Progress Notes (Addendum)
PROGRESS NOTE    Norman Reed  YNW:[id number] DOB: 09-Aug-1986 DOA: 08/12/2017 PCP: [name] [name], MD    Brief Narrative:  31 year old male percent of fevers. Patient is known to have MSSA endocarditis, chronic systolic heart failure,, hepatitis C, substance abuse, pulmonary embolism. Patient recently hospitalized for endocarditis, left AGAINST MEDICAL ADVICE, he was placed on linezolid and rifampin oral, patient developedrecurrent fevers and generalize malaise. On his initial physical examination blood pressure 150/63, heart rate 87, respiratory 21, oxygen saturation 100%. Moist mucous membranes, lungs were clear discussion bilaterally, no wheezing rales or rhonchi, heart with S1-S2 present and rhythmic, no murmurs, rubs or gallops, the abdomen was soft nontender, no lower extremity edema.   Patient was admitted to hospital with MSSA endocarditis. 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 MSSA endocarditis.  Continue antibiotic therapy with Cefazolin and rifampin IV. Cell count with wbc at 6,8.   2. Chronic stable systolic heart failure, stable. Will need outpatient echocardiogram.    3. Tobacco abuse. On nicotine patch, smoking cessation.    4. Pulmonary embolism.INR at target 1,87, INR levels per pharmacy protocol  5. Chronic pain syndrome. Continue with ibuprofen, tramadol, and suboxone.   6. Diarrhea. Will continue close observation.    DVT prophylaxis:warfarin Code Status:full Family Communication: Disposition Plan:home    Consultants:  I.D  Procedures:    Antimicrobials:    Subjective: Patient  with no complains, no nausea or vomiting. Patient had one episode of diarrhea.   Objective: Vitals:   08/29/17 0505 08/29/17 1626 08/29/17 2155 08/30/17 0500  BP: (!) 100/51 (!) 111/54 (!) 110/58 (!) 108/46  Pulse: 81 79 82 81  Resp: Temp: 97.8 F (36.6 C) 98.7 F (37.1 C) 99.3 F (37.4 C) 99 F (37.2 C)  TempSrc: Oral Oral Oral Oral  SpO2: 100% 100% 100% 100%  Weight:      Height:       No intake or output data in the 24 hours ending 08/30/17 1411 Filed Weights   08/12/17 1528 08/12/17 1801  Weight: 81.6 kg (180 lb) 76.1 kg (167 lb 11.2 oz)    Examination:  General: Not in pain or dyspnea Neurology: Awake and alert, non focal  E ENT: no pallor, no icterus, oral mucosa moist Cardiovascular: No JVD. S1-S2 present, rhythmic, no gallops, rubs, or murmurs. No lower extremity edema. Pulmonary: vesicular breath sounds bilaterally, adequate air movement, no wheezing, rhonchi or rales. Gastrointestinal. Abdomen flat, no organomegaly, non tender, no rebound or guarding Skin. No rashes Musculoskeletal: no joint deformities     Data Reviewed: I have personally reviewed following labs and imaging studies  CBC:  Recent Labs Lab 08/24/17 0423 08/26/17 0421 08/30/17 0432  WBC 8.5 7.1 6.8  NEUTROABS  --  4.2  --   HGB 9.9* 9.4* 9.9*  HCT 31.4* 29.8* 31.5*  MCV 82.6 83.2 82.9  PLT 279 238 272   Basic Metabolic Panel:  Recent Labs Lab 08/24/17 0423 08/26/17 0421  NA 135 135  K 3.9 4.3  CL 101 102  CO2 27 28  GLUCOSE 114* 101*  BUN 15 12  CREATININE 0.90 0.94  CALCIUM 8.8* 8.8*   GFR:  Estimated Creatinine Clearance: 124.8 mL/min (by C-G formula based on SCr of 0.94 mg/dL). Liver Function Tests: No results for input(s): AST, ALT, ALKPHOS, BILITOT, PROT, ALBUMIN in the last 168 hours. No results for input(s): LIPASE, AMYLASE in the last 168 hours. No results for input(s): AMMONIA in the last 168 hours. Coagulation Profile:  Recent Labs Lab  08/26/17 0421 08/27/17 0356 08/28/17 0432 08/29/17 0433 08/30/17 0432  INR 2.59 2.30 2.05 2.24 1.87   Cardiac Enzymes: No results for input(s): CKTOTAL, CKMB, CKMBINDEX, TROPONINI in the last 168 hours. BNP (last 3 results) No results for input(s): PROBNP in the last 8760 hours. HbA1C: No results for input(s): HGBA1C in the last 72 hours. CBG: No results for input(s): GLUCAP in the last 168 hours. Lipid Profile: No results for input(s): CHOL, HDL, LDLCALC, TRIG, CHOLHDL, LDLDIRECT in the last 72 hours. Thyroid Function Tests: No results for input(s): TSH, T4TOTAL, FREET4, T3FREE, THYROIDAB in the last 72 hours. Anemia Panel: No results for input(s): VITAMINB12, FOLATE, FERRITIN, TIBC, IRON, RETICCTPCT in the last 72 hours.    Radiology Studies: I have reviewed all of the imaging during this hospital visit personally     Scheduled Meds: . buprenorphine-naloxone  2 tablet Sublingual Daily  . gabapentin  300 mg Oral BID  . nicotine  21 mg Transdermal Daily  . rifampin  300 mg Oral Q8H  . sodium chloride flush  10-40 mL Intracatheter Q12H  . warfarin  15 mg Oral ONCE-1800  . Warfarin - Pharmacist Dosing Inpatient   Does not apply q1800   Continuous Infusions: .  ceFAZolin (ANCEF) IV 2 g (08/30/17 0455)     LOS: 18 days        [name], MD [facility] Pager [phone]

## 2017-08-31 DIAGNOSIS — R197 Diarrhea, unspecified: Secondary | ICD-10-CM

## 2017-08-31 LAB — CBC
HCT: 31.6 % — ABNORMAL LOW (ref 39.0–52.0)
HEMOGLOBIN: 10 g/dL — AB (ref 13.0–17.0)
MCH: 26.2 pg (ref 26.0–34.0)
MCHC: 31.6 g/dL (ref 30.0–36.0)
MCV: 82.9 fL (ref 78.0–100.0)
Platelets: 259 10*3/uL (ref 150–400)
RBC: 3.81 MIL/uL — AB (ref 4.22–5.81)
RDW: 15.6 % — ABNORMAL HIGH (ref 11.5–15.5)
WBC: 5.5 10*3/uL (ref 4.0–10.5)

## 2017-08-31 LAB — PROTIME-INR
INR: 2.2
PROTHROMBIN TIME: 24.2 s — AB (ref 11.4–15.2)

## 2017-08-31 LAB — C DIFFICILE QUICK SCREEN W PCR REFLEX
C Diff antigen: NEGATIVE
C Diff interpretation: NOT DETECTED
C Diff toxin: NEGATIVE

## 2017-08-31 MED ORDER — WARFARIN SODIUM 7.5 MG PO TABS: 15.0000 mg | ORAL_TABLET | Freq: Once | ORAL | Status: DC

## 2017-08-31 MED ORDER — ONDANSETRON HCL 4 MG/2ML IJ SOLN
4.0000 mg | Freq: Four times a day (QID) | INTRAMUSCULAR | Status: DC | PRN
  Administered 2017-08-31: 4 mg via INTRAVENOUS
  Filled 2017-08-31: qty 2

## 2017-08-31 NOTE — Progress Notes (Signed)
Notified Schorr, NP that pt states he has had 4 watery stools today. Pt requesting antidiarrheal medication. Will continue to monitor pt. Nelda Marseille, RN

## 2017-08-31 NOTE — Progress Notes (Signed)
ANTICOAGULATION CONSULT NOTE - Follow Up Consult  Pharmacy Consult for Warfarin Indication: afib/PE   Patient Measurements: Height: 6' (182.9 cm) Weight: 167 lb 11.2 oz (76.1 kg) IBW/kg (Calculated) : 77.6   Vital Signs: Temp: 97.8 F (36.6 C) (09/17 0502) Temp Source: Oral (09/17 0502) BP: 90/40 (09/17 0502) Pulse Rate: 71 (09/17 0502)  Labs:  Recent Labs  08/29/17 0433 08/30/17 0432 08/31/17 0355  HGB  --  9.9* 10.0*  HCT  --  31.5* 31.6*  PLT  --  272 259  LABPROT 24.6* 21.3* 24.2*  INR 2.24 1.87 2.20    Estimated Creatinine Clearance: 124.8 mL/min (by C-G formula based on SCr of 0.94 mg/dL).   Assessment: 31 y.o. male admitted for MSSA endocarditis on warfarin PTA for history of afib/PE. He is on rifampin (inducer) leading to need for increased doses from home regimen of 8mg  PO daily.   INR therapeutic.  No bleeding noted.  Goal of Therapy:  INR 2-3 Monitor platelets by anticoagulation protocol: Yes   Plan:  Warfarin 15mg  PO x 1 Monitor daily INR, CBC, clinical course, s/sx of bleed, PO intake, DDI   [employer], Pharm.D., BCPS Clinical Pharmacist Pager: [phone] Clinical phone for 08/31/2017 from 8:30-4:00 is [phone]. After 4pm, please call Main [facility] [phone]) for assistance. 08/31/2017 1:36 PM

## 2017-08-31 NOTE — Progress Notes (Signed)
PROGRESS NOTE    Norman Reed  ZOX:[id number] DOB: [birth year] DOA: 08/12/2017 PCP: Norman Reed Norman Coup, MD    Brief Narrative:  31 year old male percent of fevers. Patient is known to have MSSA endocarditis, chronic systolic heart failure,, hepatitis C, substance abuse, pulmonary embolism. Patient recently hospitalized for endocarditis, left AGAINST MEDICAL ADVICE, he was placed on linezolid and rifampin oral, patient developedrecurrent fevers and generalize malaise. On his initial physical examination blood pressure 150/63, heart rate 87, respiratory 21, oxygen saturation 100%. Moist mucous membranes, lungs were clear discussion bilaterally, no wheezing rales or rhonchi, heart with S1-S2 present and rhythmic, no murmurs, rubs or gallops, the abdomen was soft nontender, no lower extremity edema.   Patient was admitted to hospital with MSSA endocarditis. 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 MSSA endocarditis.  On Cefazolin and rifampin IV. Cell count with wbc at 5.5   2. Diarrhea. Suspected to be related to antibiotic therapy, seems to be reducing in frequency, will add probiotics and continue close monitoring.   3. Tobacco abuse. Continuenicotine patch, for smoking cessation.    4. Pulmonary embolism.INR at target 2,2 INR levels, continue to follow pharmacy protocol.  5. Chronic pain syndrome. On ibuprofen, tramadol, and suboxone.    DVT prophylaxis:warfarin Code Status:full Family Communication: Disposition Plan:home    Consultants:  I.D  Procedures:    Antimicrobials:    Subjective: Patient not  feeling well, had nausea, but no vomiting, positive diarrhea, watery, x2 episodes. No abdominal pain, no fever or chills.   Objective: Vitals:   08/30/17 2108 08/30/17 2200 08/31/17 0502 08/31/17 1400  BP: (!) 105/55  (!) 90/40 (!) 97/58  Pulse: 95  71 80  Resp:   18 18  Temp: (!) 100.7 F (38.2 C) 98.6 F (37 C) 97.8 F (36.6 C)   TempSrc: Oral Oral Oral   SpO2: 100%  100% 100%  Weight:      Height:        Intake/Output Summary (Last 24 hours) at 08/31/17 1622 Last data filed at 08/31/17 0653  Gross per 24 hour  Intake              210 ml  Output                0 ml  Net              210 ml   Filed Weights   08/12/17 1528 08/12/17 1801  Weight: 81.6 kg (180 lb) 76.1 kg (167 lb 11.2 oz)    Examination:  General: Not in pain or dyspnea Neurology: Awake and alert, non focal  E ENT: mild  pallor, no icterus, oral mucosa moist Cardiovascular: No JVD. S1-S2 present, rhythmic, no gallops, rubs, or murmurs. No lower extremity edema. Pulmonary: vesicular breath sounds bilaterally, adequate air movement, no wheezing, rhonchi or rales. Gastrointestinal. Abdomen flat, no organomegaly, non tender, no rebound or guarding Skin. No rashes Musculoskeletal: no joint deformities     Data Reviewed: I have personally reviewed following labs and imaging studies  CBC:  Recent Labs Lab 08/26/17 0421 08/30/17 0432 08/31/17 0355  WBC 7.1 6.8 5.5  NEUTROABS 4.2  --   --  HGB 9.4* 9.9* 10.0*  HCT 29.8* 31.5* 31.6*  MCV 83.2 82.9 82.9  PLT 238 272 259   Basic Metabolic Panel:  Recent Labs Lab 08/26/17 0421  NA 135  K 4.3  CL 102  CO2 28  GLUCOSE 101*  BUN 12  CREATININE 0.94  CALCIUM 8.8*   GFR: Estimated Creatinine Clearance: 124.8 mL/min (by C-G formula based on SCr of 0.94 mg/dL). Liver Function Tests: No results for input(s): AST, ALT, ALKPHOS, BILITOT, PROT, ALBUMIN in the last 168 hours. No results for input(s): LIPASE, AMYLASE in the last 168 hours. No  results for input(s): AMMONIA in the last 168 hours. Coagulation Profile:  Recent Labs Lab 08/27/17 0356 08/28/17 0432 08/29/17 0433 08/30/17 0432 08/31/17 0355  INR 2.30 2.05 2.24 1.87 2.20   Cardiac Enzymes: No results for input(s): CKTOTAL, CKMB, CKMBINDEX, TROPONINI in the last 168 hours. BNP (last 3 results) No results for input(s): PROBNP in the last 8760 hours. HbA1C: No results for input(s): HGBA1C in the last 72 hours. CBG: No results for input(s): GLUCAP in the last 168 hours. Lipid Profile: No results for input(s): CHOL, HDL, LDLCALC, TRIG, CHOLHDL, LDLDIRECT in the last 72 hours. Thyroid Function Tests: No results for input(s): TSH, T4TOTAL, FREET4, T3FREE, THYROIDAB in the last 72 hours. Anemia Panel: No results for input(s): VITAMINB12, FOLATE, FERRITIN, TIBC, IRON, RETICCTPCT in the last 72 hours.    Radiology Studies: I have reviewed all of the imaging during this hospital visit personally     Scheduled Meds: . buprenorphine-naloxone  2 tablet Sublingual Daily  . gabapentin  300 mg Oral BID  . nicotine  21 mg Transdermal Daily  . rifampin  300 mg Oral Q8H  . sodium chloride flush  10-40 mL Intracatheter Q12H  . warfarin  15 mg Oral ONCE-1800  . Warfarin - Pharmacist Dosing Inpatient   Does not apply q1800   Continuous Infusions: .  ceFAZolin (ANCEF) IV Stopped (08/31/17 1428)     LOS: 19 days        Norman [name], MD [facility] Pager [phone]

## 2017-09-01 LAB — CBC
HEMATOCRIT: 31.9 % — AB (ref 39.0–52.0)
Hemoglobin: 9.9 g/dL — ABNORMAL LOW (ref 13.0–17.0)
MCH: 25.8 pg — ABNORMAL LOW (ref 26.0–34.0)
MCHC: 31 g/dL (ref 30.0–36.0)
MCV: 83.1 fL (ref 78.0–100.0)
PLATELETS: 243 10*3/uL (ref 150–400)
RBC: 3.84 MIL/uL — ABNORMAL LOW (ref 4.22–5.81)
RDW: 15.3 % (ref 11.5–15.5)
WBC: 7.5 10*3/uL (ref 4.0–10.5)

## 2017-09-01 LAB — PROTIME-INR
INR: 2.05
Prothrombin Time: 22.9 seconds — ABNORMAL HIGH (ref 11.4–15.2)

## 2017-09-01 MED ORDER — ALTEPLASE 2 MG IJ SOLR
2.0000 mg | Freq: Once | INTRAMUSCULAR | Status: AC
  Administered 2017-09-01: 2 mg
  Filled 2017-09-01: qty 2

## 2017-09-01 MED ORDER — LOPERAMIDE HCL 2 MG PO CAPS: 2.0000 mg | ORAL_CAPSULE | ORAL | Status: DC | PRN

## 2017-09-01 MED ORDER — WARFARIN SODIUM 7.5 MG PO TABS
20.0000 mg | ORAL_TABLET | ORAL | Status: AC
  Administered 2017-09-01: 13:00:00 20 mg via ORAL
  Filled 2017-09-01: qty 1

## 2017-09-01 MED ORDER — SACCHAROMYCES BOULARDII 250 MG PO CAPS
250.0000 mg | ORAL_CAPSULE | Freq: Two times a day (BID) | ORAL | Status: DC
  Administered 2017-09-01 – 2017-09-08 (×15): 250 mg via ORAL
  Filled 2017-09-01 (×15): qty 1

## 2017-09-01 NOTE — Progress Notes (Signed)
   I checked in on Norman Reed this morning, we have been treating him with Suboxone for opioid use disorder since 8/14. He is currently admitted finishing antibiotic course for mitral valve bioprosthetic endocarditis. To me he looks great, much improved from when I saw him a month ago. He's put on weight, his mood is appropriate, he appears very comfortable and normal. He reports the Suboxone at its current dose is controlling his cravings, no side effects. He wants to continue with treatment after his discharge from the hospital. They're planning on discharging him next Tuesday 9/25 if all goes well. He still has a supply of Suboxone at home from his previous discharge. I arranged for him to follow-up with our opioid use disorder clinic [facility] on Tuesday 10/2 at 9:15 AM. We will continue his Suboxone therapy from there. Please call us with any questions.   [name], MD 09/01/2017, 2:00 PM

## 2017-09-01 NOTE — Progress Notes (Signed)
ANTICOAGULATION CONSULT NOTE - Follow Up Consult  Pharmacy Consult for warfarin dosing Indication: afib/PE  Allergies  Allergen Reactions  . Penicillins Other (See Comments)    Childhood reaction    Patient Measurements: Height: 6' (182.9 cm) Weight: 167 lb 11.2 oz (76.1 kg) IBW/kg (Calculated) : 77.6  Vital Signs: Temp: 97.8 F (36.6 C) (09/18 0627) Temp Source: Axillary (09/18 0627) BP: 111/53 (09/18 0627) Pulse Rate: 81 (09/18 0627)  Labs:  Recent Labs  08/30/17 0432 08/31/17 0355 09/01/17 0500  HGB 9.9* 10.0* 9.9*  HCT 31.5* 31.6* 31.9*  PLT 272 259 243  LABPROT 21.3* 24.2* 22.9*  INR 1.87 2.20 2.05    Estimated Creatinine Clearance: 124.8 mL/min (by C-G formula based on SCr of 0.94 mg/dL).   Medications:  Scheduled:  . buprenorphine-naloxone  2 tablet Sublingual Daily  . gabapentin  300 mg Oral BID  . nicotine  21 mg Transdermal Daily  . rifampin  300 mg Oral Q8H  . saccharomyces boulardii  250 mg Oral BID  . sodium chloride flush  10-40 mL Intracatheter Q12H  . warfarin  15 mg Oral ONCE-1800  . Warfarin - Pharmacist Dosing Inpatient   Does not apply q1800    Assessment: 31 yo male admitted for MSSA endocarditis on PTA warfarin for afib/PE. He is on rifampin (inducer) and is requiring higher doses than PTA warfarin 8mg .  INR is therapeutic at 2.05 but decreased from 2.2 yesterday. He did not receive warfarin dose 9/17. Hg and plts wnl, no reports of bleeding.     Goal of Therapy:  INR 2-3 Monitor platelets by anticoagulation protocol: Yes   Plan:  - warfarin 20mg  x1 - monitor CBC/INR, reports of bleeding, DDIs, po intake daily    Halston Fairclough 09/01/2017,11:57 AM

## 2017-09-01 NOTE — Progress Notes (Signed)
PROGRESS NOTE    Norman Reed  CWC:[id number] DOB: 06-Aug-1986 DOA: 08/12/2017 PCP: [name] [name], MD    Brief Narrative:  31 year old male percent of fevers. Patient is known to have MSSA endocarditis, chronic systolic heart failure,, hepatitis C, substance abuse, pulmonary embolism. Patient recently hospitalized for endocarditis, left AGAINST MEDICAL ADVICE, he was placed on linezolid and rifampin oral, patient developedrecurrent fevers and generalize malaise. On his initial physical examination blood pressure 150/63, heart rate 87, respiratory 21, oxygen saturation 100%. Moist mucous membranes, lungs were clear discussion bilaterally, no wheezing rales or rhonchi, heart with S1-S2 present and rhythmic, no murmurs, rubs or gallops, the abdomen was soft nontender, no lower extremity edema.   Patient was admitted to hospital with MSSA endocarditis. 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 MSSA endocarditis. Antibiotic therapy with Cefazolin and rifampin IV. Cell count with wbc at 7.5  2. Diarrhea, resolved. c diff testeing negative.   3. Tobacco abuse. Will continuenicotine patch, for smoking cessation.   4. Pulmonary embolism.INR at target 2.0.   5. Chronic pain syndrome. Continue ibuprofen, tramadol, and suboxone.    DVT prophylaxis:warfarin Code Status:full Family Communication: Disposition Plan:home    Consultants:  I.D  Procedures:    Antimicrobials:    Subjective: Diarrhea has improved, no nausea or vomiting, tolerating po well. No chest pain or palpitations.  Objective: Vitals:   08/31/17  0502 08/31/17 1400 08/31/17 2143 09/01/17 0627  BP: (!) 90/40 (!) 97/58 108/60 (!) 111/53  Pulse: 71 80 91 81  Resp: 18 18 17 20   Temp: 97.8 F (36.6 C)  97.9 F (36.6 C) 97.8 F (36.6 C)  TempSrc: Oral  Oral Axillary  SpO2: 100% 100% 100% 100%  Weight:      Height:        Intake/Output Summary (Last 24 hours) at 09/01/17 1240 Last data filed at 09/01/17 1000  Gross per 24 hour  Intake              560 ml  Output                0 ml  Net              560 ml   Filed Weights   08/12/17 1528 08/12/17 1801  Weight: 81.6 kg (180 lb) 76.1 kg (167 lb 11.2 oz)    Examination:  General: Not in pain or dyspnea Neurology: Awake and alert, non focal  E ENT: no pallor, no icterus, oral mucosa moist Cardiovascular: No JVD. S1-S2 present, rhythmic, no gallops, rubs, or murmurs. No lower extremity edema. Pulmonary: vesicular breath sounds bilaterally, adequate air movement, no wheezing, rhonchi or rales. Gastrointestinal. Abdomen flat, no organomegaly, non tender, no rebound or guarding Skin. No rashes Musculoskeletal: no joint deformities     Data Reviewed: I have personally reviewed following labs and imaging studies  CBC:  Recent Labs Lab 08/26/17 0421 08/30/17 0432 08/31/17 0355 09/01/17 0500  WBC 7.1 6.8 5.5 7.5  NEUTROABS 4.2  --   --   --   HGB 9.4* 9.9* 10.0* 9.9*  HCT 29.8* 31.5* 31.6* 31.9*  MCV 83.2 82.9 82.9 83.1  PLT 238 272 259  243   Basic Metabolic Panel:  Recent Labs Lab 08/26/17 0421  NA 135  K 4.3  CL 102  CO2 28  GLUCOSE 101*  BUN 12  CREATININE 0.94  CALCIUM 8.8*   GFR: Estimated Creatinine Clearance: 124.8 mL/min (by C-G formula based on SCr of 0.94 mg/dL). Liver Function Tests: No results for input(s): AST, ALT, ALKPHOS, BILITOT, PROT, ALBUMIN in the last 168 hours. No results for input(s): LIPASE, AMYLASE in the last 168 hours. No results for input(s): AMMONIA in the last 168 hours. Coagulation Profile:  Recent Labs Lab  08/28/17 0432 08/29/17 0433 08/30/17 0432 08/31/17 0355 09/01/17 0500  INR 2.05 2.24 1.87 2.20 2.05   Cardiac Enzymes: No results for input(s): CKTOTAL, CKMB, CKMBINDEX, TROPONINI in the last 168 hours. BNP (last 3 results) No results for input(s): PROBNP in the last 8760 hours. HbA1C: No results for input(s): HGBA1C in the last 72 hours. CBG: No results for input(s): GLUCAP in the last 168 hours. Lipid Profile: No results for input(s): CHOL, HDL, LDLCALC, TRIG, CHOLHDL, LDLDIRECT in the last 72 hours. Thyroid Function Tests: No results for input(s): TSH, T4TOTAL, FREET4, T3FREE, THYROIDAB in the last 72 hours. Anemia Panel: No results for input(s): VITAMINB12, FOLATE, FERRITIN, TIBC, IRON, RETICCTPCT in the last 72 hours.    Radiology Studies: I have reviewed all of the imaging during this hospital visit personally     Scheduled Meds: . buprenorphine-naloxone  2 tablet Sublingual Daily  . gabapentin  300 mg Oral BID  . nicotine  21 mg Transdermal Daily  . rifampin  300 mg Oral Q8H  . saccharomyces boulardii  250 mg Oral BID  . sodium chloride flush  10-40 mL Intracatheter Q12H  . warfarin  20 mg Oral NOW  . Warfarin - Pharmacist Dosing Inpatient   Does not apply q1800   Continuous Infusions: .  ceFAZolin (ANCEF) IV Stopped (09/01/17 1610)     LOS: 20 days        [name], MD [facility] Pager [phone]

## 2017-09-02 LAB — CBC
HEMATOCRIT: 31.5 % — AB (ref 39.0–52.0)
Hemoglobin: 10 g/dL — ABNORMAL LOW (ref 13.0–17.0)
MCH: 26.2 pg (ref 26.0–34.0)
MCHC: 31.7 g/dL (ref 30.0–36.0)
MCV: 82.7 fL (ref 78.0–100.0)
PLATELETS: 243 10*3/uL (ref 150–400)
RBC: 3.81 MIL/uL — ABNORMAL LOW (ref 4.22–5.81)
RDW: 15.6 % — AB (ref 11.5–15.5)
WBC: 6.4 10*3/uL (ref 4.0–10.5)

## 2017-09-02 LAB — PROTIME-INR
INR: 2
Prothrombin Time: 22.5 seconds — ABNORMAL HIGH (ref 11.4–15.2)

## 2017-09-02 MED ORDER — WARFARIN SODIUM 7.5 MG PO TABS
20.0000 mg | ORAL_TABLET | Freq: Once | ORAL | Status: AC
  Administered 2017-09-02: 17:00:00 20 mg via ORAL
  Filled 2017-09-02: qty 1

## 2017-09-02 NOTE — Progress Notes (Signed)
Pharmacy Antibiotic Note  Norman Reed is a 31 y.o. male admitted on 08/12/2017 with MSSA endocarditis  Pharmacy has been consulted for Ancef and Rifampin dosing.  Plan: - Ancef 2g IV q8h thru 9/25 - Rifampin 300mg  q8h thru 9/25 - monitor clinical progress  Height: 6' (182.9 cm) Weight: 167 lb 11.2 oz (76.1 kg) IBW/kg (Calculated) : 77.6  Temp (24hrs), Avg:99.8 F (37.7 C), Min:98.5 F (36.9 C), Max:101 F (38.3 C)   Recent Labs Lab 08/30/17 0432 08/31/17 0355 09/01/17 0500 09/02/17 0451  WBC 6.8 5.5 7.5 6.4    Estimated Creatinine Clearance: 124.8 mL/min (by C-G formula based on SCr of 0.94 mg/dL).    Allergies  Allergen Reactions  . Penicillins Other (See Comments)    Childhood reaction    Antimicrobials this admission: 8/29 gentamicin >> 9/7 8/29 Ancef >> 9/25 8/29 Rifampin >> 9/25  Dose adjustments this admission: N/A  Microbiology results: 8/29 BCx: negative   Thank you for allowing pharmacy to be a part of this patient's care.  Annjanette Wertenberger 09/02/2017 10:18 AM

## 2017-09-02 NOTE — Progress Notes (Signed)
ANTICOAGULATION CONSULT NOTE - Follow Up Consult  Pharmacy Consult for warfarin dosing Indication: afib/PE  Allergies  Allergen Reactions  . Penicillins Other (See Comments)    Childhood reaction    Patient Measurements: Height: 6' (182.9 cm) Weight: 167 lb 11.2 oz (76.1 kg) IBW/kg (Calculated) : 77.6   Vital Signs: Temp: 98.5 F (36.9 C) (09/19 0553) Temp Source: Oral (09/19 0553) BP: 105/50 (09/19 0553) Pulse Rate: 80 (09/19 0553)  Labs:  Recent Labs  08/31/17 0355 09/01/17 0500 09/02/17 0451  HGB 10.0* 9.9* 10.0*  HCT 31.6* 31.9* 31.5*  PLT 259 243 243  LABPROT 24.2* 22.9* 22.5*  INR 2.20 2.05 2.00    Estimated Creatinine Clearance: 124.8 mL/min (by C-G formula based on SCr of 0.94 mg/dL).   Medications:  Scheduled:  . buprenorphine-naloxone  2 tablet Sublingual Daily  . gabapentin  300 mg Oral BID  . nicotine  21 mg Transdermal Daily  . rifampin  300 mg Oral Q8H  . saccharomyces boulardii  250 mg Oral BID  . sodium chloride flush  10-40 mL Intracatheter Q12H  . Warfarin - Pharmacist Dosing Inpatient   Does not apply q1800    Assessment: 31 yo male admitted for MSSA endocarditis on PTA warfarin for history of afib/PE.  He is on rifampin (inducer), so he is requiring higher doses than home regimen of warfarin 8mg . INR is therapeutic at 2 but has been trending down, and a warfarin dose was missed on 9/17 PM.    Goal of Therapy:  INR 2-3 Monitor platelets by anticoagulation protocol: Yes   Plan:  - Warfarin 20mg  x 1 - Monitor CBC/INR, reports of bleeding, po intake, DDIs daily    Thank you, Chukwuemeka Artola, student-PharmD 09/02/2017,10:05 AM

## 2017-09-02 NOTE — Progress Notes (Signed)
PROGRESS NOTE    Norman Reed  UJW:[id number] DOB: 23-Jan-1986 DOA: 08/12/2017 PCP: [name] [name], MD    Brief Narrative:  31 year old male percent of fevers. Patient is known to have MSSA endocarditis, chronic systolic heart failure,, hepatitis C, substance abuse, pulmonary embolism. Patient recently hospitalized for endocarditis, left AGAINST MEDICAL ADVICE, he was placed on linezolid and rifampin oral, patient developedrecurrent fevers and generalize malaise. On his initial physical examination blood pressure 150/63, heart rate 87, respiratory 21, oxygen saturation 100%. Moist mucous membranes, lungs were clear discussion bilaterally, no wheezing rales or rhonchi, heart with S1-S2 present and rhythmic, no murmurs, rubs or gallops, the abdomen was soft nontender, no lower extremity edema.   Patient was admitted to hospital with MSSA endocarditis. 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 MSSA endocarditis. OnCefazolin and rifampin IV. Cell count with wbc at 6.4. With good toleration.   2. Diarrhea, resolved. Tolerating po well.   3. Tobacco abuse. Onnicotine patch, for smoking cessation.   4. Pulmonary embolism.INR at target 2.0. Continue to follow levels per pharmacy protocol  5. Chronic pain syndrome. Onibuprofen, tramadol, and suboxone.    DVT prophylaxis:warfarin Code Status:full Family Communication: Disposition Plan:home    Consultants:  I.D  Procedures:     Antimicrobials:   Cefazolin and rifampin   Subjective: Patient feeling well, no nausea or vomiting, no abdominal pain or diarrhea.    Objective: Vitals:   08/31/17 2143 09/01/17 0627 09/01/17 2147 09/02/17 0553  BP: 108/60 (!) 111/53 (!) 112/57 (!) 105/50  Pulse: 91 81 97 80  Resp: Temp: 97.9 F (36.6 C) 97.8 F (36.6 C) (!) 101 F (38.3 C) 98.5 F (36.9 C)  TempSrc:  Axillary Oral Oral  SpO2: 100% 100% 100% 100%  Weight:      Height:        Intake/Output Summary (Last 24 hours) at 09/02/17 0858 Last data filed at 09/02/17 0047  Gross per 24 hour  Intake              600 ml  Output                0 ml  Net              600 ml   Filed Weights   08/12/17 1528 08/12/17 1801  Weight: 81.6 kg (180 lb) 76.1 kg (167 lb 11.2 oz)    Examination:  General: Not in pain or dyspnea Neurology: Awake and alert, non focal  E ENT: no pallor, no icterus, oral mucosa moist Cardiovascular: No JVD. S1-S2 present, rhythmic, no gallops, rubs, or murmurs. No lower extremity edema. Pulmonary: vesicular breath sounds bilaterally, adequate air movement, no wheezing, rhonchi or rales. Gastrointestinal. Abdomen flat, no organomegaly, non tender, no rebound or guarding Skin. No rashes Musculoskeletal: no joint deformities     Data Reviewed: I have personally reviewed following labs and imaging studies  CBC:  Recent Labs Lab 08/30/17 0432 08/31/17 0355 09/01/17 0500 09/02/17 0451  WBC 6.8 5.5 7.5 6.4  HGB 9.9* 10.0* 9.9* 10.0*  HCT 31.5* 31.6* 31.9* 31.5*  MCV 82.9 82.9 83.1 82.7  PLT 272 259 243 243  Basic Metabolic Panel: No results for input(s): NA, K, CL, CO2, GLUCOSE, BUN, CREATININE, CALCIUM, MG, PHOS in the last 168 hours. GFR: Estimated Creatinine Clearance: 124.8 mL/min (by C-G formula based on SCr of 0.94 mg/dL). Liver Function Tests: No results for input(s): AST, ALT, ALKPHOS, BILITOT, PROT, ALBUMIN in the last 168 hours. No results for input(s): LIPASE, AMYLASE in the last 168 hours. No results for input(s): AMMONIA in the last 168 hours. Coagulation Profile:  Recent Labs Lab  08/29/17 0433 08/30/17 0432 08/31/17 0355 09/01/17 0500 09/02/17 0451  INR 2.24 1.87 2.20 2.05 2.00   Cardiac Enzymes: No results for input(s): CKTOTAL, CKMB, CKMBINDEX, TROPONINI in the last 168 hours. BNP (last 3 results) No results for input(s): PROBNP in the last 8760 hours. HbA1C: No results for input(s): HGBA1C in the last 72 hours. CBG: No results for input(s): GLUCAP in the last 168 hours. Lipid Profile: No results for input(s): CHOL, HDL, LDLCALC, TRIG, CHOLHDL, LDLDIRECT in the last 72 hours. Thyroid Function Tests: No results for input(s): TSH, T4TOTAL, FREET4, T3FREE, THYROIDAB in the last 72 hours. Anemia Panel: No results for input(s): VITAMINB12, FOLATE, FERRITIN, TIBC, IRON, RETICCTPCT in the last 72 hours.    Radiology Studies: I have reviewed all of the imaging during this hospital visit personally     Scheduled Meds: . buprenorphine-naloxone  2 tablet Sublingual Daily  . gabapentin  300 mg Oral BID  . nicotine  21 mg Transdermal Daily  . rifampin  300 mg Oral Q8H  . saccharomyces boulardii  250 mg Oral BID  . sodium chloride flush  10-40 mL Intracatheter Q12H  . Warfarin - Pharmacist Dosing Inpatient   Does not apply q1800   Continuous Infusions: .  ceFAZolin (ANCEF) IV 2 g (09/02/17 0603)     LOS: 21 days        [name], MD [facility] Pager [phone]

## 2017-09-03 DIAGNOSIS — Z720 Tobacco use: Secondary | ICD-10-CM

## 2017-09-03 DIAGNOSIS — F1990 Other psychoactive substance use, unspecified, uncomplicated: Secondary | ICD-10-CM

## 2017-09-03 DIAGNOSIS — G894 Chronic pain syndrome: Secondary | ICD-10-CM

## 2017-09-03 LAB — CBC
HEMATOCRIT: 32.8 % — AB (ref 39.0–52.0)
Hemoglobin: 10.3 g/dL — ABNORMAL LOW (ref 13.0–17.0)
MCH: 26 pg (ref 26.0–34.0)
MCHC: 31.4 g/dL (ref 30.0–36.0)
MCV: 82.8 fL (ref 78.0–100.0)
Platelets: 255 10*3/uL (ref 150–400)
RBC: 3.96 MIL/uL — ABNORMAL LOW (ref 4.22–5.81)
RDW: 15.3 % (ref 11.5–15.5)
WBC: 7.5 10*3/uL (ref 4.0–10.5)

## 2017-09-03 LAB — PROTIME-INR
INR: 2.83
PROTHROMBIN TIME: 29.5 s — AB (ref 11.4–15.2)

## 2017-09-03 MED ORDER — WARFARIN SODIUM 5 MG PO TABS
10.0000 mg | ORAL_TABLET | Freq: Once | ORAL | Status: AC
  Administered 2017-09-03: 10 mg via ORAL
  Filled 2017-09-03: qty 2

## 2017-09-03 NOTE — Progress Notes (Signed)
ANTICOAGULATION CONSULT NOTE - Follow Up Consult  Pharmacy Consult for warfarin dosing. Indication: afib/PE  Allergies  Allergen Reactions  . Penicillins Other (See Comments)    Childhood reaction    Patient Measurements: Height: 6' (182.9 cm) Weight: 167 lb 11.2 oz (76.1 kg) IBW/kg (Calculated) : 77.6   Vital Signs: Temp: 98.4 F (36.9 C) (09/20 0556) Temp Source: Oral (09/20 0556) BP: 104/51 (09/20 0556) Pulse Rate: 82 (09/20 0556)  Labs:  Recent Labs  09/01/17 0500 09/02/17 0451 09/03/17 0352  HGB 9.9* 10.0* 10.3*  HCT 31.9* 31.5* 32.8*  PLT 243 243 255  LABPROT 22.9* 22.5* 29.5*  INR 2.05 2.00 2.83    Estimated Creatinine Clearance: 124.8 mL/min (by C-G formula based on SCr of 0.94 mg/dL).   Medications:  Scheduled:  . buprenorphine-naloxone  2 tablet Sublingual Daily  . gabapentin  300 mg Oral BID  . nicotine  21 mg Transdermal Daily  . rifampin  300 mg Oral Q8H  . saccharomyces boulardii  250 mg Oral BID  . sodium chloride flush  10-40 mL Intracatheter Q12H  . Warfarin - Pharmacist Dosing Inpatient   Does not apply q1800    Assessment: 31 yo male admitted for MSSA endocarditis on PTA warfarin for history of afib/PE. He is on rifampin (inducer), so he is requiring higher doses than home regimen of warfarin 8mg . INR is therapeutic at 2.83 with a significant jump from 2 yesterday. Hg and plts stable, no reports of bleeding.   Goal of Therapy:  INR 2-3 Monitor platelets by anticoagulation protocol: Yes   Plan:  - Warfarin 10mg  x 1 - Monitor CBC/INR, reports of bleeding, po intake, and DDIs daily  Thank you,  Malon Kindle, student PharmD 09/03/2017,11:13 AM

## 2017-09-03 NOTE — Progress Notes (Signed)
PROGRESS NOTE    Norman Reed  DZH:[id number] DOB: [birth year] DOA: 08/12/2017 PCP: Bobbye Morton, MD   Brief Narrative:  31 year old male presenting with fevers. Patient is known to have MSSA endocarditis, chronic systolic heart failure,, hepatitis C, substance abuse, pulmonary embolism. Patient recently hospitalized for endocarditis, left AGAINST MEDICAL ADVICE, he was placed on linezolid and rifampin oral, patient developedrecurrent fevers and generalize malaise. On his initial physical examination blood pressure 150/63, heart rate 87, respiratory 21, oxygen saturation 100%. Moist mucous membranes, lungs were clear discussion bilaterally, no wheezing rales or rhonchi, heart with S1-S2 present and rhythmic, no murmurs, rubs or gallops, the abdomen was soft nontender, no lower extremity edema.   Patient was admitted to hospital with MSSA endocarditis. 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 Afebrile. Asymptomatic. No chest pain.WBC at 7.5. Continue IV Ancef and oral rifampin through 09/08/2017.  #2 pulmonary emboli Patient asymptomatic. INR s 2.83. Coumadin per pharmacy.  #3 chronic pain syndrome Stable. Continue current regimen of ibuprofen, tramadol and Suboxone. Patient will follow-up with the Suboxone clinic in the outpatient setting.  #4 tobacco abuse   Tobacco cessation. Continue nicotine patch.  #5 IV drug use Patient not expressing any withdrawal symptoms. Will likely need further outpatient follow-up.    DVT prophylaxis: Coumadin Code Status: full Family  Communication: updated patient. No family at bedside. Disposition Plan: home once IV antibiotics have been completed.   Consultants:   Infectious diseases  Procedures:   Chest x-ray 08/12/2017    Antimicrobials:   Rifampin 08/12/2017  IV Ancef 08/12/2017     Subjective: Patient sitting up in chair drawing. No chest pain. No shortness of breath. Feels good.  Objective: Vitals:   09/02/17 0553 09/02/17 1536 09/02/17 2144 09/03/17 0556  BP: (!) 105/50 (!) 96/36 (!) 108/53 (!) 104/51  Pulse: 80 83 90 82  Resp: 15 18 17 17   Temp: 98.5 F (36.9 C) 98.2 F (36.8 C) 98.8 F (37.1 C) 98.4 F (36.9 C)  TempSrc: Oral Oral Oral Oral  SpO2: 100% 100% 100% 99%  Weight:      Height:        Intake/Output Summary (Last 24 hours) at 09/03/17 1304 Last data filed at 09/02/17 1316  Gross per 24 hour  Intake               10 ml  Output                0 ml  Net               10 ml   Filed Weights   08/12/17 1528 08/12/17 1801  Weight: 81.6 kg (180 lb) 76.1 kg (167 lb 11.2 oz)    Examination:  General exam: Appears calm and comfortable  Respiratory system: Clear to auscultation. Respiratory effort normal. Cardiovascular system: S1 & S2 heard, RRR. No JVD, murmurs, rubs, gallops or clicks. No pedal edema. Gastrointestinal system: Abdomen is nondistended, soft and nontender. No organomegaly or masses felt. Normal bowel sounds heard. Central nervous system: Alert and oriented. No focal neurological deficits. Extremities: Symmetric 5 x 5 power. Skin: No rashes, lesions or  ulcers Psychiatry: Judgement and insight appear normal. Mood & affect appropriate.     Data Reviewed: I have personally reviewed following labs and imaging studies  CBC:  Recent Labs Lab 08/30/17 0432 08/31/17 0355 09/01/17 0500 09/02/17 0451 09/03/17 0352  WBC 6.8 5.5 7.5 6.4 7.5  HGB 9.9* 10.0* 9.9* 10.0* 10.3*  HCT 31.5* 31.6* 31.9* 31.5* 32.8*  MCV 82.9 82.9 83.1 82.7 82.8  PLT 272 259 243  243 255   Basic Metabolic Panel: No results for input(s): NA, K, CL, CO2, GLUCOSE, BUN, CREATININE, CALCIUM, MG, PHOS in the last 168 hours. GFR: Estimated Creatinine Clearance: 124.8 mL/min (by C-G formula based on SCr of 0.94 mg/dL). Liver Function Tests: No results for input(s): AST, ALT, ALKPHOS, BILITOT, PROT, ALBUMIN in the last 168 hours. No results for input(s): LIPASE, AMYLASE in the last 168 hours. No results for input(s): AMMONIA in the last 168 hours. Coagulation Profile:  Recent Labs Lab 08/30/17 0432 08/31/17 0355 09/01/17 0500 09/02/17 0451 09/03/17 0352  INR 1.87 2.20 2.05 2.00 2.83   Cardiac Enzymes: No results for input(s): CKTOTAL, CKMB, CKMBINDEX, TROPONINI in the last 168 hours. BNP (last 3 results) No results for input(s): PROBNP in the last 8760 hours. HbA1C: No results for input(s): HGBA1C in the last 72 hours. CBG: No results for input(s): GLUCAP in the last 168 hours. Lipid Profile: No results for input(s): CHOL, HDL, LDLCALC, TRIG, CHOLHDL, LDLDIRECT in the last 72 hours. Thyroid Function Tests: No results for input(s): TSH, T4TOTAL, FREET4, T3FREE, THYROIDAB in the last 72 hours. Anemia Panel: No results for input(s): VITAMINB12, FOLATE, FERRITIN, TIBC, IRON, RETICCTPCT in the last 72 hours. Sepsis Labs: No results for input(s): PROCALCITON, LATICACIDVEN in the last 168 hours.  Recent Results (from the past 240 hour(s))  C difficile quick scan w PCR reflex     Status: None   Collection Time: 08/31/17 10:02 PM  Result Value Ref Range Status   C Diff antigen NEGATIVE NEGATIVE Final   C Diff toxin NEGATIVE NEGATIVE Final   C Diff interpretation No C. difficile detected.  Final         Radiology Studies: No results found.      Scheduled Meds: . buprenorphine-naloxone  2 tablet Sublingual Daily  . gabapentin  300 mg Oral BID  . nicotine  21 mg Transdermal Daily  . rifampin  300 mg Oral Q8H  . saccharomyces boulardii  250 mg Oral  BID  . sodium chloride flush  10-40 mL Intracatheter Q12H  . warfarin  10 mg Oral ONCE-1800  . Warfarin - Pharmacist Dosing Inpatient   Does not apply q1800   Continuous Infusions: .  ceFAZolin (ANCEF) IV Stopped (09/03/17 0533)     LOS: 22 days    Time spent: 35 minutes    Jamarien Rodkey, MD [facility] Pager [phone]  If 7PM-7AM, please contact night-coverage [email or web address] Password [email or web address] 09/03/2017, 1:04 PM

## 2017-09-04 LAB — BASIC METABOLIC PANEL
ANION GAP: 9 (ref 5–15)
BUN: 12 mg/dL (ref 6–20)
CALCIUM: 9 mg/dL (ref 8.9–10.3)
CO2: 26 mmol/L (ref 22–32)
Chloride: 101 mmol/L (ref 101–111)
Creatinine, Ser: 0.86 mg/dL (ref 0.61–1.24)
Glucose, Bld: 97 mg/dL (ref 65–99)
Potassium: 4.1 mmol/L (ref 3.5–5.1)
SODIUM: 136 mmol/L (ref 135–145)

## 2017-09-04 LAB — PROTIME-INR
INR: 3.17
PROTHROMBIN TIME: 32.2 s — AB (ref 11.4–15.2)

## 2017-09-04 MED ORDER — WARFARIN SODIUM 5 MG PO TABS
5.0000 mg | ORAL_TABLET | Freq: Once | ORAL | Status: AC
  Administered 2017-09-04: 5 mg via ORAL
  Filled 2017-09-04: qty 1

## 2017-09-04 NOTE — Progress Notes (Signed)
PROGRESS NOTE    Norman Reed  ZOX:[id number] DOB: 1986-05-24 DOA: 08/12/2017 PCP: Bobbye Morton, MD   Brief Narrative:  31 year old male presenting with fevers. Patient is known to have MSSA endocarditis, chronic systolic heart failure,, hepatitis C, substance abuse, pulmonary embolism. Patient recently hospitalized for endocarditis, left AGAINST MEDICAL ADVICE, he was placed on linezolid and rifampin oral, patient developedrecurrent fevers and generalize malaise. On his initial physical examination blood pressure 150/63, heart rate 87, respiratory 21, oxygen saturation 100%. Moist mucous membranes, lungs were clear discussion bilaterally, no wheezing rales or rhonchi, heart with S1-S2 present and rhythmic, no murmurs, rubs or gallops, the abdomen was soft nontender, no lower extremity edema.   Patient was admitted to hospital with MSSA endocarditis. 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 Afebrile. Asymptomatic. No chest pain.WBC at 7.5. Continue IV Ancef and oral rifampin through 09/08/2017.  #2 pulmonary emboli Patient asymptomatic. INR is 3.17. Coumadin per pharmacy.  #3 chronic pain syndrome Stable. Continue current regimen of ibuprofen, tramadol and Suboxone. Patient will follow-up with the Suboxone clinic in the outpatient setting.  #4 tobacco abuse   Tobacco cessation. Continue nicotine patch.  #5 IV drug use Patient with no withdrawal symptoms. No complaints. Outpatient follow-up.     DVT prophylaxis: Coumadin Code Status:  full Family Communication: updated patient. No family at bedside. Disposition Plan: home once IV antibiotics have been completed.   Consultants:   Infectious diseases  Procedures:   Chest x-ray 08/12/2017    Antimicrobials:   Rifampin 08/12/2017  IV Ancef 08/12/2017     Subjective: Patient laying in bed. No chest pain. No shortness of breath.   Objective: Vitals:   09/03/17 1446 09/03/17 2054 09/04/17 0533 09/04/17 1411  BP: 113/61 (!) 105/53 (!) 108/56 (!) 114/45  Pulse: 82 91 81 83  Resp: Temp: 98.4 F (36.9 C) 98.9 F (37.2 C) 98.4 F (36.9 C) 98.3 F (36.8 C)  TempSrc: Oral Oral Oral Oral  SpO2: 100% 100% 100% 100%  Weight:      Height:        Intake/Output Summary (Last 24 hours) at 09/04/17 1750 Last data filed at 09/04/17 0530  Gross per 24 hour  Intake              200 ml  Output                0 ml  Net              200 ml   Filed Weights   08/12/17 1528 08/12/17 1801  Weight: 81.6 kg (180 lb) 76.1 kg (167 lb 11.2 oz)    Examination:  General exam: Appears calm and comfortable  Respiratory system: Clear to auscultation. No wheezing, no crackles, no rhonchi. Respiratory effort normal. Cardiovascular system: S1 & S2 heard, RRR. No JVD, murmurs, rubs, gallops or clicks. No pedal edema. Gastrointestinal system: Abdomen is nondistended, soft and nontender. No organomegaly or masses felt. Normal bowel sounds heard. Central nervous system: Alert and oriented. No focal neurological deficits. Extremities: Symmetric 5 x  5 power. Skin: No rashes, lesions or ulcers Psychiatry: Judgement and insight appear normal. Mood & affect appropriate.     Data Reviewed: I have personally reviewed following labs and imaging studies  CBC:  Recent Labs Lab 08/30/17 0432 08/31/17 0355 09/01/17 0500 09/02/17 0451 09/03/17 0352  WBC 6.8 5.5 7.5 6.4 7.5  HGB 9.9* 10.0* 9.9* 10.0* 10.3*  HCT 31.5* 31.6* 31.9* 31.5* 32.8*  MCV 82.9 82.9 83.1  82.7 82.8  PLT 272 259 243 243 255   Basic Metabolic Panel:  Recent Labs Lab 09/04/17 0427  NA 136  K 4.1  CL 101  CO2 26  GLUCOSE 97  BUN 12  CREATININE 0.86  CALCIUM 9.0   GFR: Estimated Creatinine Clearance: 136.4 mL/min (by C-G formula based on SCr of 0.86 mg/dL). Liver Function Tests: No results for input(s): AST, ALT, ALKPHOS, BILITOT, PROT, ALBUMIN in the last 168 hours. No results for input(s): LIPASE, AMYLASE in the last 168 hours. No results for input(s): AMMONIA in the last 168 hours. Coagulation Profile:  Recent Labs Lab 08/31/17 0355 09/01/17 0500 09/02/17 0451 09/03/17 0352 09/04/17 0427  INR 2.20 2.05 2.00 2.83 3.17   Cardiac Enzymes: No results for input(s): CKTOTAL, CKMB, CKMBINDEX, TROPONINI in the last 168 hours. BNP (last 3 results) No results for input(s): PROBNP in the last 8760 hours. HbA1C: No results for input(s): HGBA1C in the last 72 hours. CBG: No results for input(s): GLUCAP in the last 168 hours. Lipid Profile: No results for input(s): CHOL, HDL, LDLCALC, TRIG, CHOLHDL, LDLDIRECT in the last 72 hours. Thyroid Function Tests: No results for input(s): TSH, T4TOTAL, FREET4, T3FREE, THYROIDAB in the last 72 hours. Anemia Panel: No results for input(s): VITAMINB12, FOLATE, FERRITIN, TIBC, IRON, RETICCTPCT in the last 72 hours. Sepsis Labs: No results for input(s): PROCALCITON, LATICACIDVEN in the last 168 hours.  Recent Results (from the past 240 hour(s))  C difficile quick scan w PCR reflex     Status: None   Collection Time: 08/31/17 10:02 PM  Result Value Ref Range Status   C Diff antigen NEGATIVE NEGATIVE Final   C Diff toxin NEGATIVE NEGATIVE Final   C Diff interpretation No C. difficile detected.  Final         Radiology Studies: No results found.      Scheduled Meds: . buprenorphine-naloxone  2 tablet Sublingual Daily  . gabapentin  300 mg Oral BID  . nicotine  21 mg Transdermal Daily  . rifampin  300 mg Oral  Q8H  . saccharomyces boulardii  250 mg Oral BID  . sodium chloride flush  10-40 mL Intracatheter Q12H  . Warfarin - Pharmacist Dosing Inpatient   Does not apply q1800   Continuous Infusions: .  ceFAZolin (ANCEF) IV Stopped (09/04/17 1254)     LOS: 23 days    Time spent: 35 minutes    THOMPSON,DANIEL, MD [facility] Pager [phone]  If 7PM-7AM, please contact night-coverage [email or web address] Password [email or web address] 09/04/2017, 5:50 PM

## 2017-09-04 NOTE — Progress Notes (Signed)
ANTICOAGULATION CONSULT NOTE - Follow Up Consult  Pharmacy Consult for warfarin dosing Indication: afib/PE Allergies  Allergen Reactions  . Penicillins Other (See Comments)    Childhood reaction    Patient Measurements: Height: 6' (182.9 cm) Weight: 167 lb 11.2 oz (76.1 kg) IBW/kg (Calculated) : 77.6   Vital Signs: Temp: 98.4 F (36.9 C) (09/21 0533) Temp Source: Oral (09/21 0533) BP: 108/56 (09/21 0533) Pulse Rate: 81 (09/21 0533)  Labs:  Recent Labs  09/02/17 0451 09/03/17 0352 09/04/17 0427  HGB 10.0* 10.3*  --   HCT 31.5* 32.8*  --   PLT 243 255  --   LABPROT 22.5* 29.5* 32.2*  INR 2.00 2.83 3.17  CREATININE  --   --  0.86    Estimated Creatinine Clearance: 136.4 mL/min (by C-G formula based on SCr of 0.86 mg/dL).   Medications:  Scheduled:  . buprenorphine-naloxone  2 tablet Sublingual Daily  . gabapentin  300 mg Oral BID  . nicotine  21 mg Transdermal Daily  . rifampin  300 mg Oral Q8H  . saccharomyces boulardii  250 mg Oral BID  . sodium chloride flush  10-40 mL Intracatheter Q12H  . Warfarin - Pharmacist Dosing Inpatient   Does not apply q1800    Assessment: 31 yo male admitted for MSSA endocarditis on PTA warfarin for afib/PE. On rifampin (inducer), and has been requiring higher doses than PTA dose of 8mg . His INR is supratherapeutic at 3.17. His INR had been trending down and a dose was missed on 9/17, so he was given an increased dose for 2 days which then caused his INR to increase significantly. Hg and plts stable, no reports of bleeding.   Goal of Therapy:  INR 2-3 Monitor platelets by anticoagulation protocol: Yes   Plan:  - warfarin 5mg  po x 1 - monitor CBC/INR, po intake, reports of bleeding, DDIs daily   Norman Reed 09/04/2017,10:35 AM

## 2017-09-04 NOTE — Progress Notes (Signed)
Pharmacy Antibiotic Note  Norman Reed is a 31 y.o. male admitted on 08/12/2017 with MSSA endocarditis.  Pharmacy has been consulted for Ancef and Rifampin dosing. Pt remains afebrile, WBC wnl and renal function is stable.    Plan: - Ancef 2g IV q8h thru 9/25 - Rifampin 300mg  PO q8h thru 9/25  - monitor clinical progress  Height: 6' (182.9 cm) Weight: 167 lb 11.2 oz (76.1 kg) IBW/kg (Calculated) : 77.6  Temp (24hrs), Avg:98.6 F (37 C), Min:98.4 F (36.9 C), Max:98.9 F (37.2 C)   Recent Labs Lab 08/30/17 0432 08/31/17 0355 09/01/17 0500 09/02/17 0451 09/03/17 0352 09/04/17 0427  WBC 6.8 5.5 7.5 6.4 7.5  --   CREATININE  --   --   --   --   --  0.86    Estimated Creatinine Clearance: 136.4 mL/min (by C-G formula based on SCr of 0.86 mg/dL).    Allergies  Allergen Reactions  . Penicillins Other (See Comments)    Childhood reaction    Antimicrobials this admission: 8/29 gentamicin >> 9/7 8/29 Ancef >> 9/25 8/29 Rifampin >> 9/25  Dose adjustments this admission: N/A  Microbiology results: 8/29 BCx: negative   Thank you for allowing pharmacy to be a part of this patient's care.  Cherisse Carrell 09/04/2017 10:30 AM

## 2017-09-05 LAB — CBC
HCT: 32 % — ABNORMAL LOW (ref 39.0–52.0)
HEMOGLOBIN: 10.1 g/dL — AB (ref 13.0–17.0)
MCH: 26.1 pg (ref 26.0–34.0)
MCHC: 31.6 g/dL (ref 30.0–36.0)
MCV: 82.7 fL (ref 78.0–100.0)
Platelets: 242 10*3/uL (ref 150–400)
RBC: 3.87 MIL/uL — AB (ref 4.22–5.81)
RDW: 15.3 % (ref 11.5–15.5)
WBC: 7.4 10*3/uL (ref 4.0–10.5)

## 2017-09-05 LAB — PROTIME-INR
INR: 2.5
PROTHROMBIN TIME: 26.8 s — AB (ref 11.4–15.2)

## 2017-09-05 MED ORDER — WARFARIN SODIUM 7.5 MG PO TABS
12.5000 mg | ORAL_TABLET | Freq: Once | ORAL | Status: AC
  Administered 2017-09-05: 18:00:00 12.5 mg via ORAL
  Filled 2017-09-05: qty 1

## 2017-09-05 NOTE — Progress Notes (Signed)
PROGRESS NOTE    Norman Reed  ZOX:[id number] DOB: 06/05/1986 DOA: 08/12/2017 PCP: Bobbye Morton, MD   Brief Narrative:  31 year old male presenting with fevers. Patient is known to have MSSA endocarditis, chronic systolic heart failure,, hepatitis C, substance abuse, pulmonary embolism. Patient recently hospitalized for endocarditis, left AGAINST MEDICAL ADVICE, he was placed on linezolid and rifampin oral, patient developedrecurrent fevers and generalize malaise. On his initial physical examination blood pressure 150/63, heart rate 87, respiratory 21, oxygen saturation 100%. Moist mucous membranes, lungs were clear discussion bilaterally, no wheezing rales or rhonchi, heart with S1-S2 present and rhythmic, no murmurs, rubs or gallops, the abdomen was soft nontender, no lower extremity edema.   Patient was admitted to hospital with MSSA endocarditis. 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 Afebrile. Asymptomatic. No chest pain.WBC at 7.4. Continue IV Ancef and oral rifampin through 09/08/2017.  #2 pulmonary emboli Patient asymptomatic. No chest pain. No shortness of breath. INR therapeutic at 2.50. Coumadin per pharmacy.  #3 chronic pain syndrome Stable. Continue current regimen of ibuprofen, tramadol and Suboxone. Patient will follow-up with the Suboxone clinic in the outpatient setting.  #4 tobacco abuse   Tobacco cessation. Continue nicotine patch.  #5 IV drug use Patient with no withdrawal symptoms. Continue Suboxone. Outpatient  follow-up.    DVT prophylaxis: Coumadin Code Status: full Family Communication: updated patient. No family at bedside. Disposition Plan: home once IV antibiotics have been completed.   Consultants:   Infectious diseases  Procedures:   Chest x-ray 08/12/2017    Antimicrobials:   Rifampin 08/12/2017  IV Ancef 08/12/2017     Subjective: Patient in the room denies any chest pain or shortness of breath. Feels well.   Objective: Vitals:   09/04/17 0533 09/04/17 1411 09/04/17 2124 09/05/17 0447  BP: (!) 108/56 (!) 114/45 (!) 111/46 (!) 97/48  Pulse: 81 83 95 80  Resp: Temp: 98.4 F (36.9 C) 98.3 F (36.8 C) 99.1 F (37.3 C) 98.3 F (36.8 C)  TempSrc: Oral Oral Oral Oral  SpO2: 100% 100% 100% 100%  Weight:      Height:        Intake/Output Summary (Last 24 hours) at 09/05/17 1300 Last data filed at 09/05/17 0925  Gross per 24 hour  Intake              240 ml  Output                0 ml  Net              240 ml   Filed Weights   08/12/17 1528 08/12/17 1801  Weight: 81.6 kg (180 lb) 76.1 kg (167 lb 11.2 oz)    Examination:  General exam: Appears calm and comfortable  Respiratory system: Clear to auscultation Bilaterally. Cardiovascular system: Regular rate rhythm no murmurs rubs or gallops. No JVD. Gastrointestinal system: Abdomen is soft, nontender, nondistended, positive bowel sounds.  Central nervous system: Alert and oriented. No focal neurological deficits. Extremities: Symmetric 5 x 5 power. Skin: No rashes, lesions or ulcers  Psychiatry: Judgement and insight appear normal. Mood & affect appropriate.     Data Reviewed: I have personally reviewed following labs and imaging studies  CBC:  Recent Labs Lab 08/31/17 0355 09/01/17 0500 09/02/17 0451 09/03/17 0352 09/05/17 0433  WBC 5.5 7.5 6.4 7.5 7.4  HGB 10.0* 9.9* 10.0* 10.3* 10.1*  HCT 31.6* 31.9* 31.5* 32.8* 32.0*  MCV 82.9 83.1 82.7 82.8 82.7  PLT 259 243 243 255 242    Basic Metabolic Panel:  Recent Labs Lab 09/04/17 0427  NA 136  K 4.1  CL 101  CO2 26  GLUCOSE 97  BUN 12  CREATININE 0.86  CALCIUM 9.0   GFR: Estimated Creatinine Clearance: 136.4 mL/min (by C-G formula based on SCr of 0.86 mg/dL). Liver Function Tests: No results for input(s): AST, ALT, ALKPHOS, BILITOT, PROT, ALBUMIN in the last 168 hours. No results for input(s): LIPASE, AMYLASE in the last 168 hours. No results for input(s): AMMONIA in the last 168 hours. Coagulation Profile:  Recent Labs Lab 09/01/17 0500 09/02/17 0451 09/03/17 0352 09/04/17 0427 09/05/17 0433  INR 2.05 2.00 2.83 3.17 2.50   Cardiac Enzymes: No results for input(s): CKTOTAL, CKMB, CKMBINDEX, TROPONINI in the last 168 hours. BNP (last 3 results) No results for input(s): PROBNP in the last 8760 hours. HbA1C: No results for input(s): HGBA1C in the last 72 hours. CBG: No results for input(s): GLUCAP in the last 168 hours. Lipid Profile: No results for input(s): CHOL, HDL, LDLCALC, TRIG, CHOLHDL, LDLDIRECT in the last 72 hours. Thyroid Function Tests: No results for input(s): TSH, T4TOTAL, FREET4, T3FREE, THYROIDAB in the last 72 hours. Anemia Panel: No results for input(s): VITAMINB12, FOLATE, FERRITIN, TIBC, IRON, RETICCTPCT in the last 72 hours. Sepsis Labs: No results for input(s): PROCALCITON, LATICACIDVEN in the last 168 hours.  Recent Results (from the past 240 hour(s))  C difficile quick scan w PCR reflex     Status: None   Collection Time: 08/31/17 10:02 PM  Result Value Ref Range Status   C Diff antigen NEGATIVE NEGATIVE Final   C Diff toxin NEGATIVE NEGATIVE Final   C Diff interpretation No C. difficile detected.  Final         Radiology Studies: No results found.      Scheduled Meds: . buprenorphine-naloxone  2 tablet Sublingual Daily  . gabapentin  300 mg Oral BID  . nicotine  21 mg Transdermal Daily  . rifampin  300 mg Oral Q8H  . saccharomyces boulardii  250  mg Oral BID  . sodium chloride flush  10-40 mL Intracatheter Q12H  . warfarin  12.5 mg Oral ONCE-1800  . Warfarin - Pharmacist Dosing Inpatient   Does not apply q1800   Continuous Infusions: .  ceFAZolin (ANCEF) IV Stopped (09/05/17 1322)     LOS: 24 days    Time spent: 35 minutes    THOMPSON,DANIEL, MD [facility] Pager [phone]  If 7PM-7AM, please contact night-coverage [email or web address] Password [email or web address] 09/05/2017, 1:00 PM

## 2017-09-05 NOTE — Progress Notes (Signed)
ANTICOAGULATION CONSULT NOTE - Follow Up Consult  Pharmacy Consult for warfarin Indication: atrial fibrillation/PE  Allergies  Allergen Reactions  . Penicillins Other (See Comments)    Childhood reaction    Patient Measurements: Height: 6' (182.9 cm) Weight: 167 lb 11.2 oz (76.1 kg) IBW/kg (Calculated) : 77.6   Vital Signs: Temp: 98.3 F (36.8 C) (09/22 0447) Temp Source: Oral (09/22 0447) BP: 97/48 (09/22 0447) Pulse Rate: 80 (09/22 0447)  Labs:  Recent Labs  09/03/17 0352 09/04/17 0427 09/05/17 0433  HGB 10.3*  --  10.1*  HCT 32.8*  --  32.0*  PLT 255  --  242  LABPROT 29.5* 32.2* 26.8*  INR 2.83 3.17 2.50  CREATININE  --  0.86  --     Estimated Creatinine Clearance: 136.4 mL/min (by C-G formula based on SCr of 0.86 mg/dL).   Medications:  Scheduled:  . buprenorphine-naloxone  2 tablet Sublingual Daily  . gabapentin  300 mg Oral BID  . nicotine  21 mg Transdermal Daily  . rifampin  300 mg Oral Q8H  . saccharomyces boulardii  250 mg Oral BID  . sodium chloride flush  10-40 mL Intracatheter Q12H  . warfarin  12.5 mg Oral ONCE-1800  . Warfarin - Pharmacist Dosing Inpatient   Does not apply q1800    Assessment: 31 y/o male on PTA warfarin for Afib/PE with history of bioprosthetic tricuspid and mitral valve replacements in 2016 admitted for MSSA endocarditis treated with cefazolin and rifampin. Pharmacy consulted to dose warfarin. INR 2.5 is at goal today. Patient has been requiring higher doses than PTA regimen, likely due to rifampin-induced metabolism. CBC stable, no bleeding noted.  PTA warfarin dose: 8 mg daily  Goal of Therapy:  INR 2-3 Monitor platelets by anticoagulation protocol: Yes   Plan:  Warfarin 12.5 mg x 1 Daily INR Monitor CBC, s/sx of bleeding   Al Corpus, PharmD PGY1 [facility] Resident Phone: [phone] After 3:30PM please call Main Pharmacy [phone] 09/05/2017,9:52 AM

## 2017-09-06 DIAGNOSIS — I330 Acute and subacute infective endocarditis: Secondary | ICD-10-CM

## 2017-09-06 LAB — PROTIME-INR
INR: 1.93
Prothrombin Time: 21.9 seconds — ABNORMAL HIGH (ref 11.4–15.2)

## 2017-09-06 MED ORDER — WARFARIN SODIUM 7.5 MG PO TABS
15.0000 mg | ORAL_TABLET | Freq: Once | ORAL | Status: AC
  Administered 2017-09-06: 15 mg via ORAL
  Filled 2017-09-06: qty 2

## 2017-09-06 NOTE — Progress Notes (Signed)
PROGRESS NOTE    Norman Reed  KWI:[id number] DOB: 1986-03-15 DOA: 08/12/2017 PCP: Bobbye Morton, MD   Brief Narrative:  31 year old male presenting with fevers. Patient is known to have MSSA endocarditis, chronic systolic heart failure,, hepatitis C, substance abuse, pulmonary embolism. Patient recently hospitalized for endocarditis, left AGAINST MEDICAL ADVICE, he was placed on linezolid and rifampin oral, patient developedrecurrent fevers and generalize malaise. On his initial physical examination blood pressure 150/63, heart rate 87, respiratory 21, oxygen saturation 100%. Moist mucous membranes, lungs were clear discussion bilaterally, no wheezing rales or rhonchi, heart with S1-S2 present and rhythmic, no murmurs, rubs or gallops, the abdomen was soft nontender, no lower extremity edema.   Patient was admitted to hospital with MSSA endocarditis. 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 Number white count. Asymptomatic. Continue IV Ancef and oral rifampin through 09/08/2017.  #2 pulmonary emboli Patient asymptomatic. No chest pain. No shortness of breath. INR at 1.93 from 2.50. Coumadin per pharmacy.  #3 chronic pain syndrome Pain is controlled. Continue current regimen of ibuprofen, tramadol and Suboxone. Patient will follow-up with the Suboxone clinic in the outpatient setting.  #4 tobacco abuse   Tobacco cessation. Continue nicotine patch.  #5 IV drug use Continue Suboxone. Outpatient follow-up.    DVT prophylaxis: Coumadin Code Status:  full Family Communication: updated patient. No family at bedside. Disposition Plan: home once IV antibiotics have been completed.   Consultants:   Infectious diseases  Procedures:   Chest x-ray 08/12/2017    Antimicrobials:   Rifampin 08/12/2017  IV Ancef 08/12/2017     Subjective: Patient sates did not get much sleep last night.No CP. No SOB.  Objective: Vitals:   09/05/17 0447 09/05/17 1615 09/05/17 2202 09/06/17 0527  BP: (!) 97/48 (!) 107/52 (!) 117/56 (!) 113/47  Pulse: 80 89 86 75  Resp:    18  Temp: 98.3 F (36.8 C) 98.4 F (36.9 C) 98.9 F (37.2 C) 97.7 F (36.5 C)  TempSrc: Oral Oral Oral Oral  SpO2: 100% 100% 100% 100%  Weight:      Height:        Intake/Output Summary (Last 24 hours) at 09/06/17 1256 Last data filed at 09/05/17 2146  Gross per 24 hour  Intake              310 ml  Output                0 ml  Net              310 ml   Filed Weights   08/12/17 1528 08/12/17 1801  Weight: 81.6 kg (180 lb) 76.1 kg (167 lb 11.2 oz)    Examination:  General exam: NAD. Respiratory system: Clear to auscultation Bilaterally. Cardiovascular system: Regular rate rhythm no murmurs rubs or gallops. No JVD. Gastrointestinal system: Abdomen is soft, nontender, nondistended, positive bowel sounds.  Central nervous system: Alert and oriented. No focal neurological deficits. Extremities: Symmetric 5 x 5 power. Skin: No rashes, lesions or ulcers Psychiatry: Judgement and insight appear normal. Mood & affect appropriate.  Data Reviewed: I have personally reviewed following labs and imaging studies  CBC:  Recent Labs Lab 08/31/17 0355 09/01/17 0500 09/02/17 0451 09/03/17 0352 09/05/17 0433  WBC 5.5 7.5 6.4 7.5 7.4  HGB 10.0* 9.9* 10.0* 10.3* 10.1*  HCT 31.6* 31.9* 31.5* 32.8* 32.0*  MCV 82.9 83.1 82.7 82.8 82.7  PLT 259 243 243 255 242   Basic Metabolic Panel:  Recent Labs Lab 09/04/17 0427  NA 136  K 4.1  CL 101  CO2 26  GLUCOSE  97  BUN 12  CREATININE 0.86  CALCIUM 9.0   GFR: Estimated Creatinine Clearance: 136.4 mL/min (by C-G formula based on SCr of 0.86 mg/dL). Liver Function Tests: No results for input(s): AST, ALT, ALKPHOS, BILITOT, PROT, ALBUMIN in the last 168 hours. No results for input(s): LIPASE, AMYLASE in the last 168 hours. No results for input(s): AMMONIA in the last 168 hours. Coagulation Profile:  Recent Labs Lab 09/02/17 0451 09/03/17 0352 09/04/17 0427 09/05/17 0433 09/06/17 0417  INR 2.00 2.83 3.17 2.50 1.93   Cardiac Enzymes: No results for input(s): CKTOTAL, CKMB, CKMBINDEX, TROPONINI in the last 168 hours. BNP (last 3 results) No results for input(s): PROBNP in the last 8760 hours. HbA1C: No results for input(s): HGBA1C in the last 72 hours. CBG: No results for input(s): GLUCAP in the last 168 hours. Lipid Profile: No results for input(s): CHOL, HDL, LDLCALC, TRIG, CHOLHDL, LDLDIRECT in the last 72 hours. Thyroid Function Tests: No results for input(s): TSH, T4TOTAL, FREET4, T3FREE, THYROIDAB in the last 72 hours. Anemia Panel: No results for input(s): VITAMINB12, FOLATE, FERRITIN, TIBC, IRON, RETICCTPCT in the last 72 hours. Sepsis Labs: No results for input(s): PROCALCITON, LATICACIDVEN in the last 168 hours.  Recent Results (from the past 240 hour(s))  C difficile quick scan w PCR reflex     Status: None   Collection Time: 08/31/17 10:02 PM  Result Value Ref Range Status   C Diff antigen NEGATIVE NEGATIVE Final   C Diff toxin NEGATIVE NEGATIVE Final   C Diff interpretation No C. difficile detected.  Final         Radiology Studies: No results found.      Scheduled Meds: . buprenorphine-naloxone  2 tablet Sublingual Daily  . gabapentin  300 mg Oral BID  . nicotine  21 mg Transdermal Daily  . rifampin  300 mg Oral Q8H  . saccharomyces boulardii  250 mg Oral BID  . sodium chloride flush  10-40 mL Intracatheter Q12H  . warfarin  15 mg Oral ONCE-1800  .  Warfarin - Pharmacist Dosing Inpatient   Does not apply q1800   Continuous Infusions: .  ceFAZolin (ANCEF) IV Stopped (09/06/17 0545)     LOS: 25 days    Time spent: 35 minutes    THOMPSON,DANIEL, MD [facility] Pager [phone]  If 7PM-7AM, please contact night-coverage [email or web address] Password [facility] 09/06/2017, 12:56 PM

## 2017-09-06 NOTE — Progress Notes (Signed)
ANTICOAGULATION CONSULT NOTE - Follow Up Consult  Pharmacy Consult for warfarin Indication: atrial fibrillation/PE  Allergies  Allergen Reactions  . Penicillins Other (See Comments)    Childhood reaction    Patient Measurements: Height: 6' (182.9 cm) Weight: 167 lb 11.2 oz (76.1 kg) IBW/kg (Calculated) : 77.6   Vital Signs: Temp: 97.7 F (36.5 C) (09/23 0527) Temp Source: Oral (09/23 0527) BP: 113/47 (09/23 0527) Pulse Rate: 75 (09/23 0527)  Labs:  Recent Labs  09/04/17 0427 09/05/17 0433 09/06/17 0417  HGB  --  10.1*  --   HCT  --  32.0*  --   PLT  --  242  --   LABPROT 32.2* 26.8* 21.9*  INR 3.17 2.50 1.93  CREATININE 0.86  --   --     Estimated Creatinine Clearance: 136.4 mL/min (by C-G formula based on SCr of 0.86 mg/dL).   Medications:  Scheduled:  . buprenorphine-naloxone  2 tablet Sublingual Daily  . gabapentin  300 mg Oral BID  . nicotine  21 mg Transdermal Daily  . rifampin  300 mg Oral Q8H  . saccharomyces boulardii  250 mg Oral BID  . sodium chloride flush  10-40 mL Intracatheter Q12H  . Warfarin - Pharmacist Dosing Inpatient   Does not apply q1800    Assessment: 31 y/o male on PTA warfarin for Afib/PE with history of bioprosthetic tricuspid and mitral valve replacements in 2016 admitted for MSSA endocarditis treated with cefazolin and rifampin. Pharmacy consulted to dose warfarin.   INR down today 1.93 from 2.5 yesterday. INR has been fluctuating quickly and significantly, appears patient is very sensitive to changes in dose. Of note, dose was missed 9/17, so given boosted doses (20 mg) x 2 days resulting in INR > 3 on 9/21. Patient has been requiring higher doses than PTA regimen, likely due to rifampin-induced metabolism. CBC stable, no bleeding noted.  PTA warfarin dose: 8 mg daily  Goal of Therapy:  INR 2-3 Monitor platelets by anticoagulation protocol: Yes   Plan:  Warfarin 15 mg x 1 Daily INR Monitor CBC, s/sx of  bleeding   Al Corpus, PharmD PGY1 [facility] Resident Phone: [phone] After 3:30PM please call Main Pharmacy [phone] 09/06/2017,9:32 AM

## 2017-09-07 LAB — BASIC METABOLIC PANEL
Anion gap: 4 — ABNORMAL LOW (ref 5–15)
BUN: 11 mg/dL (ref 6–20)
CHLORIDE: 104 mmol/L (ref 101–111)
CO2: 29 mmol/L (ref 22–32)
Calcium: 9 mg/dL (ref 8.9–10.3)
Creatinine, Ser: 0.8 mg/dL (ref 0.61–1.24)
GFR calc Af Amer: >60 mL/min (ref >60)
GFR calc non Af Amer: >60 mL/min (ref >60)
GLUCOSE: 101 mg/dL — AB (ref 65–99)
POTASSIUM: 4.2 mmol/L (ref 3.5–5.1)
Sodium: 137 mmol/L (ref 135–145)

## 2017-09-07 LAB — CBC
HEMATOCRIT: 31.7 % — AB (ref 39.0–52.0)
Hemoglobin: 9.8 g/dL — ABNORMAL LOW (ref 13.0–17.0)
MCH: 25.8 pg — AB (ref 26.0–34.0)
MCHC: 30.9 g/dL (ref 30.0–36.0)
MCV: 83.4 fL (ref 78.0–100.0)
Platelets: 221 10*3/uL (ref 150–400)
RBC: 3.8 MIL/uL — ABNORMAL LOW (ref 4.22–5.81)
RDW: 15.6 % — AB (ref 11.5–15.5)
WBC: 6.1 10*3/uL (ref 4.0–10.5)

## 2017-09-07 LAB — PROTIME-INR
INR: 1.55
Prothrombin Time: 18.5 seconds — ABNORMAL HIGH (ref 11.4–15.2)

## 2017-09-07 MED ORDER — WARFARIN SODIUM 7.5 MG PO TABS
15.0000 mg | ORAL_TABLET | Freq: Once | ORAL | Status: AC
  Administered 2017-09-07: 15 mg via ORAL
  Filled 2017-09-07: qty 2

## 2017-09-07 MED ORDER — ENOXAPARIN SODIUM 120 MG/0.8ML ~~LOC~~ SOLN
120.0000 mg | SUBCUTANEOUS | Status: DC
  Administered 2017-09-07 – 2017-09-08 (×2): 120 mg via SUBCUTANEOUS
  Filled 2017-09-07 (×3): qty 0.8

## 2017-09-07 MED ORDER — ENOXAPARIN (LOVENOX) PATIENT EDUCATION KIT
PACK | Freq: Once | Status: AC
  Administered 2017-09-07: 13:00:00
  Filled 2017-09-07: qty 1

## 2017-09-07 NOTE — Progress Notes (Signed)
PROGRESS NOTE    Gene Weatherby  HQI:[id number] DOB: Nov 11, 1986 DOA: 08/12/2017 PCP: Bobbye Morton, MD   Brief Narrative:  31 year old male presenting with fevers. Patient is known to have MSSA endocarditis, chronic systolic heart failure,, hepatitis C, substance abuse, pulmonary embolism. Patient recently hospitalized for endocarditis, left AGAINST MEDICAL ADVICE, he was placed on linezolid and rifampin oral, patient developedrecurrent fevers and generalize malaise. On his initial physical examination blood pressure 150/63, heart rate 87, respiratory 21, oxygen saturation 100%. Moist mucous membranes, lungs were clear discussion bilaterally, no wheezing rales or rhonchi, heart with S1-S2 present and rhythmic, no murmurs, rubs or gallops, the abdomen was soft nontender, no lower extremity edema.   Patient was admitted to hospital with MSSA endocarditis. 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 No chest pain or shortness of breath. White count normalized. Asymptomatic. Continue IV Ancef and oral rifampin through 09/08/2017.  #2 pulmonary emboli Patient asymptomatic. No chest pain. No shortness of breath. INR subtherapeutic at 1.55 from 1.93 from 2.50. Bridge with Lovenox. Coumadin per pharmacy.  #3 chronic pain syndrome Pain is controlled. Continue current regimen of ibuprofen, tramadol and Suboxone. Patient will follow-up with the Suboxone clinic in the outpatient setting.  #4 tobacco abuse   Tobacco cessation. Continue  nicotine patch.  #5 IV drug use Continue Suboxone. Outpatient follow-up.  #6 history of hepatitis C Outpatient follow-up with ID.    DVT prophylaxis: Coumadin Code Status: full Family Communication: updated patient. No family at bedside. Disposition Plan: home once IV antibiotics have been completed, hopefully tomorrow.   Consultants:   Infectious diseases  Procedures:   Chest x-ray 08/12/2017    Antimicrobials:   Rifampin 08/12/2017  IV Ancef 08/12/2017     Subjective: Patient feels well. No chest pain. No shortness of breath. Patient anticipating discharge tomorrow. Patient asking where he could possibly get his hepatitis C treated in the outpatient setting.   Objective: Vitals:   09/06/17 0527 09/06/17 1644 09/06/17 2117 09/07/17 0507  BP: (!) 113/47 (!) 106/55 106/61 (!) 90/44  Pulse: 75 92 81 78  Resp: 18 18 16 16   Temp: 97.7 F (36.5 C) 98.6 F (37 C) 98.6 F (37 C) 97.9 F (36.6 C)  TempSrc: Oral Oral Oral Oral  SpO2: 100% 100% 100% 100%  Weight:      Height:        Intake/Output Summary (Last 24 hours) at 09/07/17 1302 Last data filed at 09/06/17 1538  Gross per 24 hour  Intake               10 ml  Output                0 ml  Net               10 ml   Filed Weights   08/12/17 1528 08/12/17 1801  Weight: 81.6 kg (180 lb) 76.1 kg (167 lb 11.2 oz)    Examination:  General exam: NAD. Respiratory system: Clear to auscultation Bilaterally. No wheezing, No crackles, no rhonchi. Cardiovascular system: Regular  rate rhythm no murmurs rubs or gallops. No JVD. Gastrointestinal system: Abdomen is soft, nontender, nondistended, positive bowel sounds.  Central nervous system: Alert and oriented. No focal neurological deficits. Extremities: Symmetric 5 x 5 power. Skin: No rashes, lesions or ulcers Psychiatry: Judgement and insight appear normal. Mood & affect appropriate.     Data Reviewed: I have personally reviewed following labs and imaging  studies  CBC:  Recent Labs Lab 09/01/17 0500 09/02/17 0451 09/03/17 0352 09/05/17 0433 09/07/17 0431  WBC 7.5 6.4 7.5 7.4 6.1  HGB 9.9* 10.0* 10.3* 10.1* 9.8*  HCT 31.9* 31.5* 32.8* 32.0* 31.7*  MCV 83.1 82.7 82.8 82.7 83.4  PLT 243 243 255 242 221   Basic Metabolic Panel:  Recent Labs Lab 09/04/17 0427 09/07/17 0431  NA 136 137  K 4.1 4.2  CL 101 104  CO2 26 29  GLUCOSE 97 101*  BUN 12 11  CREATININE 0.86 0.80  CALCIUM 9.0 9.0   GFR: Estimated Creatinine Clearance: 146.7 mL/min (by C-G formula based on SCr of 0.8 mg/dL). Liver Function Tests: No results for input(s): AST, ALT, ALKPHOS, BILITOT, PROT, ALBUMIN in the last 168 hours. No results for input(s): LIPASE, AMYLASE in the last 168 hours. No results for input(s): AMMONIA in the last 168 hours. Coagulation Profile:  Recent Labs Lab 09/03/17 0352 09/04/17 0427 09/05/17 0433 09/06/17 0417 09/07/17 0431  INR 2.83 3.17 2.50 1.93 1.55   Cardiac Enzymes: No results for input(s): CKTOTAL, CKMB, CKMBINDEX, TROPONINI in the last 168 hours. BNP (last 3 results) No results for input(s): PROBNP in the last 8760 hours. HbA1C: No results for input(s): HGBA1C in the last 72 hours. CBG: No results for input(s): GLUCAP in the last 168 hours. Lipid Profile: No results for input(s): CHOL, HDL, LDLCALC, TRIG, CHOLHDL, LDLDIRECT in the last 72 hours. Thyroid Function Tests: No results for input(s): TSH, T4TOTAL, FREET4, T3FREE, THYROIDAB in the last 72 hours. Anemia Panel: No results for input(s): VITAMINB12, FOLATE, FERRITIN, TIBC, IRON, RETICCTPCT in the last 72 hours. Sepsis Labs: No results for input(s): PROCALCITON, LATICACIDVEN in the last 168 hours.  Recent Results (from the past 240 hour(s))  C difficile quick scan w PCR reflex     Status: None   Collection Time: 08/31/17 10:02 PM  Result Value Ref Range Status   C Diff antigen NEGATIVE NEGATIVE Final   C Diff toxin NEGATIVE NEGATIVE Final   C Diff  interpretation No C. difficile detected.  Final         Radiology Studies: No results found.      Scheduled Meds: . buprenorphine-naloxone  2 tablet Sublingual Daily  . enoxaparin (LOVENOX) injection  120 mg Subcutaneous Q24H  . enoxaparin   Does not apply Once  . gabapentin  300 mg Oral BID  . nicotine  21 mg Transdermal Daily  . rifampin  300 mg Oral Q8H  . saccharomyces boulardii  250 mg Oral BID  . sodium chloride flush  10-40 mL Intracatheter Q12H  . warfarin  15 mg Oral ONCE-1800  . Warfarin - Pharmacist Dosing Inpatient   Does not apply q1800   Continuous Infusions: .  ceFAZolin (ANCEF) IV Stopped (09/07/17 0534)     LOS: 26 days    Time spent: 35 minutes    THOMPSON,DANIEL, MD [facility] Pager [phone]  If 7PM-7AM, please contact night-coverage [email or web address] Password [email or web address] 09/07/2017, 1:02 PM

## 2017-09-07 NOTE — Progress Notes (Signed)
ANTICOAGULATION CONSULT NOTE - Follow Up Consult  Pharmacy Consult for Lovenox and warfarin dosing Indication: afib/PE  Allergies  Allergen Reactions  . Penicillins Other (See Comments)    Childhood reaction    Patient Measurements: Height: 6' (182.9 cm) Weight: 167 lb 11.2 oz (76.1 kg) IBW/kg (Calculated) : 77.6  Vital Signs: Temp: 97.9 F (36.6 C) (09/24 0507) Temp Source: Oral (09/24 0507) BP: 90/44 (09/24 0507) Pulse Rate: 78 (09/24 0507)  Labs:  Recent Labs  09/05/17 0433 09/06/17 0417 09/07/17 0431  HGB 10.1*  --  9.8*  HCT 32.0*  --  31.7*  PLT 242  --  221  LABPROT 26.8* 21.9* 18.5*  INR 2.50 1.93 1.55  CREATININE  --   --  0.80    Estimated Creatinine Clearance: 146.7 mL/min (by C-G formula based on SCr of 0.8 mg/dL).   Medications:  Scheduled:  . buprenorphine-naloxone  2 tablet Sublingual Daily  . enoxaparin   Does not apply Once  . gabapentin  300 mg Oral BID  . nicotine  21 mg Transdermal Daily  . rifampin  300 mg Oral Q8H  . saccharomyces boulardii  250 mg Oral BID  . sodium chloride flush  10-40 mL Intracatheter Q12H  . Warfarin - Pharmacist Dosing Inpatient   Does not apply q1800    Assessment: 31 yo male admitted for MSSA endocarditis on PTA warfarin for history of afib/PE. He is on rifampin (inducer) and has been requiring higher doses (12.5-20mg ) than PTA dose of 8mg . His INR fluctuates significantly with dose changes.  Of note, a dose was missed on 9/17 so an increased dose was given which resulted in supratherapeutic INR 3.17 on 9/21. A decreased dose was given for two days which has now resulted in a subtherapeutic INR 1.55. Lovenox is being started due to subtherapeutic INR until INR is therapeutic again. Hg and plts are stable with no reports of bleeding   Goal of Therapy:  INR 2-3 Monitor platelets by anticoagulation protocol: Yes   Plan:  - Warfarin 15mg  x 1 - Enoxaparin 120mg  q24h  - Monitor INR/CBC, DDIs, po intake, and  reports of bleeding daily   Ankur Snowdon 09/07/2017,9:19 AM

## 2017-09-07 NOTE — Progress Notes (Addendum)
Benefits check in process for Lovenox 120mg  daily x 4 days. CM to f/u with results. Gae Gallop RN,BSN,CM

## 2017-09-08 DIAGNOSIS — T826XXS Infection and inflammatory reaction due to cardiac valve prosthesis, sequela: Secondary | ICD-10-CM

## 2017-09-08 LAB — CBC
HCT: 31.2 % — ABNORMAL LOW (ref 39.0–52.0)
HEMOGLOBIN: 9.6 g/dL — AB (ref 13.0–17.0)
MCH: 25.8 pg — AB (ref 26.0–34.0)
MCHC: 30.8 g/dL (ref 30.0–36.0)
MCV: 83.9 fL (ref 78.0–100.0)
PLATELETS: 232 10*3/uL (ref 150–400)
RBC: 3.72 MIL/uL — AB (ref 4.22–5.81)
RDW: 15.4 % (ref 11.5–15.5)
WBC: 7.5 10*3/uL (ref 4.0–10.5)

## 2017-09-08 LAB — PROTIME-INR
INR: 1.55
Prothrombin Time: 18.5 s — ABNORMAL HIGH (ref 11.4–15.2)

## 2017-09-08 MED ORDER — WARFARIN SODIUM 7.5 MG PO TABS: 15.0000 mg | ORAL_TABLET | Freq: Once | ORAL | Status: DC

## 2017-09-08 MED ORDER — IBUPROFEN 200 MG PO TABS: 200.0000 mg | ORAL_TABLET | Freq: Four times a day (QID) | ORAL | 0 refills | Status: DC | PRN

## 2017-09-08 MED ORDER — NICOTINE 21 MG/24HR TD PT24: 21.0000 mg | MEDICATED_PATCH | Freq: Every day | TRANSDERMAL | 0 refills | Status: AC

## 2017-09-08 MED ORDER — ENOXAPARIN SODIUM 120 MG/0.8ML ~~LOC~~ SOLN: 120.0000 mg | SUBCUTANEOUS | 0 refills | Status: DC

## 2017-09-08 MED ORDER — WARFARIN SODIUM 7.5 MG PO TABS: 15.0000 mg | ORAL_TABLET | Freq: Once | ORAL | 0 refills | Status: AC

## 2017-09-08 MED ORDER — CARVEDILOL 3.125 MG PO TABS: 3.1250 mg | ORAL_TABLET | Freq: Two times a day (BID) | ORAL | 0 refills | Status: AC

## 2017-09-08 MED ORDER — ZOLPIDEM TARTRATE 10 MG PO TABS: 10.0000 mg | ORAL_TABLET | Freq: Every evening | ORAL | 0 refills | Status: AC | PRN

## 2017-09-08 MED ORDER — GABAPENTIN 600 MG PO TABS: 300.0000 mg | ORAL_TABLET | Freq: Two times a day (BID) | ORAL | 0 refills | Status: AC

## 2017-09-08 NOTE — Progress Notes (Signed)
Instructed pt on procedure. HOB less than 45*. Pt held breath upon line removal. Pressure held, no s/sx of bleeding. Pressure drsg applied. Instructed to remain in bed for 30 min. Instructed to leave pressure drsg in place for 24 hours and monitor report any s/sx of bleeding or complications. Pt. VU. Tomasita Morrow, RN VAST

## 2017-09-08 NOTE — Care Management Note (Signed)
Case Management Note  Patient Details  Name: Norman Reed MRN: [id number] Date of Birth: 1986-08-23  Subjective/Objective:         Admitted to hospital with MSSA endocarditis,hx of MSSA endocarditis, chronic systolic heart failure, hepatitis C, substance abuse, pulmonary embolism. Resides with mom.         Shahid Amburn (Mother) [place] Brinkley (Brother)     [phone]      PCP: Maryjean Ka  Action/Plan: Plan to d/c to home today once IV ABX therapy completes. Benefits check in process for Lovenox 120mg  daily x 4 days, pt with subtherapeutic INR, CM to f/u with results.  Expected Discharge Date:    9/25//2018           Expected Discharge Plan:  Home/Self Care  In-House Referral:     Discharge planning Services  CM Consult  Post Acute Care Choice:    Choice offered to:     DME Arranged:    DME Agency:     HH Arranged:    HH Agency:     Status of Service:  Completed, signed off  If discussed at [employer] of Stay Meetings, dates discussed:    Additional Comments:  Epifanio Lesches, RN 09/08/2017, 9:12 AM

## 2017-09-08 NOTE — Progress Notes (Signed)
PICC team remove line. Patient must wait 30 mins

## 2017-09-08 NOTE — Progress Notes (Signed)
Jesse Fall, RN        # 4.  S/W MELISSA @ [facility] # [phone]    1. LOVENOX   160 MG DAILY X 4   COVER- YES  COP-PAY- $ 195.56  TIER- 4 DRUG  PRIOR APPROVAL- NO    2. ENOXAPARIN 160 MG DAILY X 4   COVER- YES  CO-PAY- $ 10.00  TIER- 2 DRUG  PRIOR APPROVAL- NO   PREFERRED PHARMACY : [facility]   Previous Messages    ----- Message -----  From: Jacinta Shoe, RN  Sent: 09/07/2017  6:32 PM  To: [facility] Ccm Case Mgr Assistant  Subject: benefits check                  Please check copay and authorization if needed for lovenox 160mg  daily x4. Thanks      Gae Gallop RN,BSN,CM

## 2017-09-08 NOTE — Discharge Summary (Signed)
Physician Discharge Summary  Domique Clapper ZOX:[id number] DOB: 22-Sep-1986 DOA: 08/12/2017  PCP: Bobbye Morton, MD  Admit date: 08/12/2017 Discharge date: 09/08/2017  Time spent: 65 minutes  Recommendations for Outpatient Follow-up:  1. Follow-up with Dr. Orvan Falconer, ID in 2 weeks. 2. Follow-up with Street, [name], MD in 2 weeks. Patient will need a basic metabolic profile done to follow-up on electrolytes and renal function. 3. Follow-up with Street, [name], MD on Friday, 09/11/2017 for PT/INR check. Decision at that time will be made as to whether patient is to continue on his Lovenox bridge.   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 male with medical history significant of MSSA endocarditis, chronic systolic heart failure, IV drug abuse, anemia, hepatitis C, was undergoing treatment for the MSSA endocarditis with IV Antibiotics until 8/17 , when he left AMA with oral rifampin and oral zyvox,. He is back in ED with his mother, for fever and chills and generalized body aches. He was referred to medical service for admission. He denied cough, congestion, chest pain, nasuea or vomiting, diarrhea or abdominal pain.  He denied any hematemesis or hematochezia, he denied using any illicit drugs at home. In ED, blood cultures were done. Labs were done, showed normal WBC count, hemoglobin of 10.8. UA is negative. CXR  showed the following. Persistent small right pleural effusion with adjacent prominent interstitial thickening and patchy density within the right lower lobe, which could represent pneumonia.  Hospital Course:  31 year old male presenting with fevers. Patient is known to have MSSA endocarditis, chronic systolic heart failure,, hepatitis C, substance abuse, pulmonary embolism. Patient recently hospitalized for endocarditis, left AGAINST MEDICAL ADVICE, he was placed on linezolid and rifampin oral, patient developedrecurrent fevers and generalize malaise. On his initial physical examination blood pressure 150/63, heart rate 87, respiratory 21, oxygen saturation 100%. Moist mucous membranes, lungs were clear discussion bilaterally, no wheezing rales or rhonchi, heart with S1-S2 present and rhythmic, no murmurs, rubs or gallops, the abdomen was soft nontender, no lower extremity edema.   Patient was admitted to hospital with MSSA endocarditis. Unable to locate outpatient facility for IV antibiotics, will complete therapy on 09/08/2017  #1 MSSA prosthetic valve endocarditis Patient had initially left AMA while being treated for MSSA prosthetic valve endocarditis and had left on Zyvox and rifampin. Patient had presented with failure of outpatient treatment was admitted and placed empirically on IV antibiotics. ID was consulted who followed the patient throughout during the hospitalization. Patient was placed on IV Ancef and oral rifampin and monitored. Patient improved clinically. Did not have any further fever or seizures. Denied any chest pain or shortness of breath. Patient was a good patient for the rest of this hospitalization and followed all recommendations. Patient finished his course of antibiotics on 09/08/2017 and was discharged in stable and improved condition. Patient will follow-up with Dr. Orvan Falconer of ID in the outpatient setting. Patient was discharged in  stable and improved condition.   #2  pulmonary emboli Patient asymptomatic. No chest pain. No shortness of breath. INR during the hospitalization became supra therapeutic and patient's Coumadin was initially held. Once Coumadin was resumed patient's INR became subtherapeutic at 1.55 from 1.93 from 2.50. Bridged with Lovenox. Patient will be discharged home on Lovenox bridge with Coumadin and will need a INR checked on Friday, 09/11/2017.   #3 chronic pain syndrome Pain was controlled. Patient was maintained on a regimen of ibuprofen, tramadol and Suboxone. Patient will follow-up with the Suboxone clinic in the outpatient setting.  #4 tobacco abuse   Tobacco cessation. Placed on nicotine patch.  #5 IV drug use Continued on home regimen of Suboxone. Outpatient follow-up.  #6 history of hepatitis C Outpatient follow-up with ID.  Procedures:  Chest x-ray 08/12/2017  picc   Consultations:  Infectious diseases  Discharge Exam: Vitals:   09/08/17 0547 09/08/17 1512  BP: (!) 94/47 (!) 111/54  Pulse: 94 83  Resp: 20 18  Temp: 98.2 F (36.8 C) 98.7 F (37.1 C)  SpO2: 99% 100%    General: NAD Cardiovascular: RRR Respiratory: CTAB  Discharge Instructions   Discharge Instructions    Diet - low sodium heart healthy    Complete by:  As directed    Increase activity slowly    Complete by:  As directed      Discharge Medication List as of 09/08/2017  3:53 PM    START taking these medications   Details  enoxaparin (LOVENOX) 120 MG/0.8ML injection Inject 0.8 mLs (120 mg total) into the skin daily., Starting Wed 09/09/2017, Until Sun 09/13/2017, Print    gabapentin (NEURONTIN) 600 MG tablet Take 0.5 tablets (300 mg total) by mouth 2 (two) times daily., Starting Tue 09/08/2017, Print    ibuprofen (ADVIL,MOTRIN) 200 MG tablet Take 1 tablet (200 mg total) by mouth every 6 (six) hours as needed for fever or headache., Starting Tue 09/08/2017, No Print    nicotine (NICODERM CQ - DOSED IN MG/24 HOURS) 21 mg/24hr patch  Place 1 patch (21 mg total) onto the skin daily., Starting Wed 09/09/2017, Print    zolpidem (AMBIEN) 10 MG tablet Take 1 tablet (10 mg total) by mouth at bedtime as needed for sleep., Starting Tue 09/08/2017, Print      CONTINUE these medications which have CHANGED   Details  carvedilol (COREG) 3.125 MG tablet Take 1 tablet (3.125 mg total) by mouth 2 (two) times daily with a meal., Starting Tue 09/08/2017, Print    warfarin (COUMADIN) 7.5 MG tablet Take 2 tablets (15 mg total) by mouth one time only at 6 PM., Starting Tue 09/08/2017, Print      CONTINUE these medications which have NOT CHANGED   Details  acetaminophen (TYLENOL) 325 MG tablet Take 650 mg by mouth every 6 (six) hours as needed for mild pain or fever., Historical Med    lisinopril (PRINIVIL,ZESTRIL) 2.5 MG tablet Take 2.5 mg by mouth daily., Starting Mon 08/10/2017, Historical Med    Multiple Vitamins-Minerals (MULTIVITAMIN WITH MINERALS) tablet Take 1 tablet by mouth daily., Historical Med    Probiotic Product (PROBIOTIC PO) Take 1 tablet by mouth as needed (digestion). Taking while on antibiotics, Historical Med    SUBOXONE 8-2 MG FILM Place 2 Film under the tongue daily., Starting Sun 08/09/2017, Historical Med    torsemide (DEMADEX) 10 MG tablet Take 10 mg by mouth daily., Starting Mon 08/10/2017, Historical Med      STOP taking these medications  enoxaparin (LOVENOX) 80 MG/0.8ML injection      linezolid (ZYVOX) 600 MG tablet      rifampin (RIFADIN) 300 MG capsule      enoxaparin (LOVENOX) 40 MG/0.4ML injection        Allergies  Allergen Reactions  . Penicillins Other (See Comments)    Childhood reaction   Follow-up Information    [name], MD. Schedule an appointment as soon as possible for a visit in 2 week(s).   Specialty:  Infectious Diseases Why:  f/u in 2 weeks. Contact information: [address] [place] [address] [phone]        Street, [name], MD  Follow up on 09/11/2017.   Specialty:  Family Medicine Why:  for PT/INR check and further recomendations Contact information: [address] [place] [address] [phone]        PCP. Schedule an appointment as soon as possible for a visit in 2 week(s).            The results of significant diagnostics from this hospitalization (including imaging, microbiology, ancillary and laboratory) are listed below for reference.    Significant Diagnostic Studies: Dg Chest 2 View  Result Date: 08/12/2017 CLINICAL DATA:  Fever. EXAM: CHEST  2 VIEW COMPARISON:  CT chest dated July 30, 2017. FINDINGS: Mildly enlarged cardiomediastinal silhouette, unchanged. Prior tricuspid and mitral valve replacements. Epicardial pacing wires are again noted. Small right pleural effusion with adjacent right lower lobe interstitial thickening and patchy density. The left lung is clear. No pneumothorax. No acute osseous abnormality. IMPRESSION: Persistent small right pleural effusion with adjacent prominent interstitial thickening and patchy density within the right lower lobe, which could represent pneumonia. Electronically Signed   By: Obie Dredge M.D.   On: 08/12/2017 12:41    Microbiology: Recent Results (from the past 240 hour(s))  C difficile quick scan w PCR reflex     Status: None   Collection Time: 08/31/17 10:02 PM  Result Value Ref Range Status   C Diff antigen NEGATIVE NEGATIVE Final   C Diff toxin NEGATIVE NEGATIVE Final   C Diff interpretation No C. difficile detected.  Final     Labs: Basic Metabolic Panel:  Recent Labs Lab 09/04/17 0427 09/07/17 0431  NA 136 137  K 4.1 4.2  CL 101 104  CO2 26 29  GLUCOSE 97 101*  BUN 12 11  CREATININE 0.86 0.80  CALCIUM 9.0 9.0   Liver Function Tests: No results for input(s): AST, ALT, ALKPHOS, BILITOT, PROT, ALBUMIN in the last 168 hours. No results for input(s): LIPASE, AMYLASE in the last 168 hours. No results for input(s): AMMONIA  in the last 168 hours. CBC:  Recent Labs Lab 09/02/17 0451 09/03/17 0352 09/05/17 0433 09/07/17 0431 09/08/17 0459  WBC 6.4 7.5 7.4 6.1 7.5  HGB 10.0* 10.3* 10.1* 9.8* 9.6*  HCT 31.5* 32.8* 32.0* 31.7* 31.2*  MCV 82.7 82.8 82.7 83.4 83.9  PLT 243 255 242 221 232   Cardiac Enzymes: No results for input(s): CKTOTAL, CKMB, CKMBINDEX, TROPONINI in the last 168 hours. BNP: BNP (last 3 results) No results for input(s): BNP in the last 8760 hours.  ProBNP (last 3 results) No results for input(s): PROBNP in the last 8760 hours.  CBG: No results for input(s): GLUCAP in the last 168 hours.     SignedRamiro Harvest MD.  [facility] 09/08/2017, 5:48 PM

## 2017-09-08 NOTE — Progress Notes (Signed)
ANTICOAGULATION CONSULT NOTE - Follow Up Consult  Pharmacy Consult for warfarin and enoxaparin dosing Indication: aib/PE  Allergies  Allergen Reactions  . Penicillins Other (See Comments)    Childhood reaction    Patient Measurements: Height: 6' (182.9 cm) Weight: 167 lb 11.2 oz (76.1 kg) IBW/kg (Calculated) : 77.6   Vital Signs: Temp: 98.2 F (36.8 C) (09/25 0547) Temp Source: Oral (09/25 0547) BP: 94/47 (09/25 0547) Pulse Rate: 94 (09/25 0547)  Labs:  Recent Labs  09/06/17 0417 09/07/17 0431 09/08/17 0459  HGB  --  9.8* 9.6*  HCT  --  31.7* 31.2*  PLT  --  221 232  LABPROT 21.9* 18.5* 18.5*  INR 1.93 1.55 1.55  CREATININE  --  0.80  --     Estimated Creatinine Clearance: 146.7 mL/min (by C-G formula based on SCr of 0.8 mg/dL).   Medications:  Scheduled:  . buprenorphine-naloxone  2 tablet Sublingual Daily  . enoxaparin (LOVENOX) injection  120 mg Subcutaneous Q24H  . gabapentin  300 mg Oral BID  . nicotine  21 mg Transdermal Daily  . rifampin  300 mg Oral Q8H  . saccharomyces boulardii  250 mg Oral BID  . sodium chloride flush  10-40 mL Intracatheter Q12H  . Warfarin - Pharmacist Dosing Inpatient   Does not apply q1800    Assessment: 31 yo male admitted for MSSA endocarditis on PTA warfarin for history of afib/PE. He is completing rifampin (inducer) today 9/25. He has been requiring higher doses of warfarin (12.5-20mg ) than PTA dose of 8mg . With the completion of rifampin his dose should be gradually lowered to his PTA dose with close INR monitoring. Studies have shown that it can take 2-4 weeks for the enzymes to replenish and have normal warfarin metabolism. This requires close INR monitoring with frequent dose changes.   His INR fluctuates significantly with dose changes. Of note, a dose was missed on 9/17 so an increased dose was given which resulted in supratherapeutic INR 3.17 on 9/21. A decreased dose was given for two days which has now resulted in a  subtherapeutic INR 1.55. Lovenox has been started until INR is therapeutic again. Hg and plts are stable with no reports of bleeding   Goal of Therapy:  INR 2-3 Monitor platelets by anticoagulation protocol: Yes   Plan:  - Warfarin 15mg  x 1 - Enoxaparin 120mg  q24h - Monitor INR/CBC daily  - If patient is discharged today, would recommend warfarin 15mg  daily and enoxaparin 120mg  q24h and have INR checked ideally on Friday to determine future dose adjustments.    Thanks,  Malon Kindle PharmD student  09/08/2017,2:22 PM

## 2017-09-08 NOTE — Progress Notes (Signed)
Patient discharge instructions reviews with mother at bedside. All questions and concerns addressed. Patient have all valuable and provided a chart to transport personal belonging. Patient educated and self inject himself with Lovenox.

## 2017-09-17 ENCOUNTER — Telehealth: Payer: Self-pay

## 2017-09-17 ENCOUNTER — Ambulatory Visit (INDEPENDENT_AMBULATORY_CARE_PROVIDER_SITE_OTHER): Payer: BLUE CROSS/BLUE SHIELD | Admitting: Internal Medicine

## 2017-09-17 DIAGNOSIS — Z952 Presence of prosthetic heart valve: Secondary | ICD-10-CM

## 2017-09-17 DIAGNOSIS — I5022 Chronic systolic (congestive) heart failure: Secondary | ICD-10-CM

## 2017-09-17 DIAGNOSIS — B9561 Methicillin susceptible Staphylococcus aureus infection as the cause of diseases classified elsewhere: Secondary | ICD-10-CM

## 2017-09-17 DIAGNOSIS — F329 Major depressive disorder, single episode, unspecified: Secondary | ICD-10-CM | POA: Diagnosis not present

## 2017-09-17 DIAGNOSIS — F1910 Other psychoactive substance abuse, uncomplicated: Secondary | ICD-10-CM

## 2017-09-17 DIAGNOSIS — R768 Other specified abnormal immunological findings in serum: Secondary | ICD-10-CM

## 2017-09-17 DIAGNOSIS — I330 Acute and subacute infective endocarditis: Secondary | ICD-10-CM

## 2017-09-17 DIAGNOSIS — F32A Depression, unspecified: Secondary | ICD-10-CM | POA: Insufficient documentation

## 2017-09-17 NOTE — Assessment & Plan Note (Addendum)
I will make a referral to [facility] heart care so he can establish with a cardiologist here in [place].

## 2017-09-17 NOTE — Telephone Encounter (Signed)
Per verbal order by Dr. Orvan Falconer I called [facility] to schedule an appointment for [name]. The appt is on 09/18/17 at 3:00 p.m. He is to bring a list of his current medication with him to his appt. After scheduling the appt with [facility] I called Norman Reed to inform him of his appt. I left a detailed message with his mother who answered the phone. She wrote down all the information I received from [facility] including address, appt time, location of office, as well as the Doctor who he is seeing. She relayed the message once more before ending the call. Lorenso Courier, [place]

## 2017-09-17 NOTE — Assessment & Plan Note (Signed)
He can restart Wellbutrin now.

## 2017-09-17 NOTE — Progress Notes (Signed)
[facility]  Patient Active Problem List   Diagnosis Date Noted  . Prosthetic valve endocarditis (HCC)     Priority: High  . 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 Hepatitis C antibody test positive 09/17/2017  . Depression 09/17/2017  . Tobacco abuse   . Chronic pain syndrome   . IVDU (intravenous drug user) 08/13/2017  . Chronic systolic heart failure (HCC) 07/27/2017  . Anemia 07/27/2017  . History of mitral valve replacement 07/27/2017  . History of tricuspid valve replacement 07/27/2017  . Thrombocytopenia (HCC) 07/27/2017  . Polysubstance abuse (HCC) 07/27/2017    Patient's Medications  New Prescriptions   No medications on file  Previous Medications   ACETAMINOPHEN (TYLENOL) 325 MG TABLET    Take 650 mg by mouth every 6 (six) hours as needed for mild pain or fever.   CARVEDILOL (COREG) 3.125 MG TABLET    Take 1 tablet (3.125 mg total) by mouth 2 (two) times daily with a meal.   ENOXAPARIN (LOVENOX) 120 MG/0.8ML INJECTION    Inject 0.8 mLs (120 mg total) into the skin daily.   GABAPENTIN (NEURONTIN) 600 MG TABLET    Take 0.5 tablets (300 mg total) by mouth 2 (two) times daily.   IBUPROFEN (ADVIL,MOTRIN) 200 MG TABLET    Take 1 tablet (200 mg total) by mouth every 6 (six) hours as needed for fever or headache.   LISINOPRIL (PRINIVIL,ZESTRIL) 2.5 MG TABLET    Take 2.5 mg by mouth daily.   MULTIPLE VITAMINS-MINERALS (MULTIVITAMIN WITH MINERALS) TABLET    Take 1 tablet by mouth daily.   NICOTINE (NICODERM CQ - DOSED IN MG/24 HOURS) 21 MG/24HR PATCH    Place 1 patch (21 mg total) onto the skin daily.   PROBIOTIC PRODUCT (PROBIOTIC PO)    Take 1 tablet by mouth as needed (digestion). Taking while on antibiotics   SUBOXONE 8-2 MG FILM    Place 2 Film under the tongue daily.   TORSEMIDE (DEMADEX) 10 MG TABLET    Take 10 mg by mouth daily.   WARFARIN (COUMADIN) 7.5 MG TABLET     Take 2 tablets (15 mg total) by mouth one time only at 6 PM.   ZOLPIDEM (AMBIEN) 10 MG TABLET    Take 1 tablet (10 mg total) by mouth at bedtime as needed for sleep.  Modified Medications   No medications on file  Discontinued Medications   No medications on file    Subjective: Early is in with his mother for his hospital follow-up visit. He has a history of polysubstance abuse, addiction and injecting drug use. He developed endocarditis and required mitral and tricuspid valve replacement at [facility] in 2016. He was seen at [facility] in early August with fever and found to have MSSA bacteremia. He was transferred up to [facility] and started on IV cefazolin, gentamicin and rifampin. Echocardiogram revealed a 6 x 14 mm vegetation on the thickened mitral valve. There was mild aortic regurgitation. He refused skilled nursing facility placement so he was discharged on oral linezolid and rifampin. He says that he took the medication but he was readmitted 12 days later with fever, chills and generalized body aches. Repeat blood cultures were negative. He agreed to skilled nursing facility. He was started back on IV cefazolin, gentamicin and oral rifampin. He received a total of 50 days of antibiotic treatment. He completed his  latest round of IV antibiotic therapy on 09/08/2017 and was discharged from the skilled nursing facility. He has been living in [place] with his parents. He has been drug free. He has not in any drug counseling program. He is taking the Suboxone that was started in the hospital. He was supposed to follow-up with Dr. Erlinda Hong for ongoing Suboxone therapy. Unfortunately his hospital discharge papers said that he was supposed to see Dr. Judie Petit instead of Dr. Oswaldo Done. When his mother called the clinic to inquire about the time of the visit with Dr. Judie Petit she was told that there was no one there by that name so they missed the scheduled appointment on  09/15/2017. He feels like the Suboxone is helping. He would like to get into drug counseling. He was in inpatient counseling at [facility] last year. He would like to be established with a cardiologist here in [place]. When he had his open heart surgery 2 years ago he was found to have hepatitis C antibody. I do not see that any further testing was ever done. He has never been on any treatment. He is HIV antibody negative. He says that he has been suffering with depression ever since his open heart surgery. He tells me that someone said that he had to stop taking his Wellbutrin while he was on antibiotics. He wants to know if he can restart Wellbutrin.  Review of Systems: Review of Systems  Constitutional: Negative for chills, diaphoresis, fever, malaise/fatigue and weight loss.  HENT: Negative for sore throat.   Respiratory: Negative for cough, sputum production and shortness of breath.   Cardiovascular: Negative for chest pain.  Gastrointestinal: Negative for abdominal pain, diarrhea, heartburn, nausea and vomiting.  Genitourinary: Negative for dysuria and frequency.  Musculoskeletal: Negative for joint pain and myalgias.  Skin: Negative for rash.  Neurological: Negative for dizziness and headaches.  Psychiatric/Behavioral: Positive for depression. Negative for substance abuse. The patient is not nervous/anxious.     Past Medical History:  Diagnosis Date  . Anemia   . Anxiety   . Chronic systolic heart failure (HCC)   . Daily headache   . Depression   . Heart murmur   . Hepatitis C    "not treated for it yet" (08/12/2017)  . History of bacterial endocarditis    Norman Reed 08/12/2017  . History of cardiac arrest ~ 01/2016   "in ER; 20 minutes after I was given MSO4; I'm not allergic to MSO4; might have gotten a little too much"  . IV drug abuse   . Myocardial infarction (HCC)    "X 2 a couple months ago; I ddidn't even know about them" (08/12/2017)    Social History  Substance  Use Topics  . Smoking status: Current Every Day Smoker    Packs/day: 0.25    Years: 13.00    Types: Cigarettes  . Smokeless tobacco: Current User    Types: Chew  . Alcohol use No    Family History  Problem Relation Age of Onset  . Hyperlipidemia Maternal Grandmother   . Hyperlipidemia Paternal Grandfather     Allergies  Allergen Reactions  . Penicillins Other (See Comments)    Childhood reaction    Objective: Vitals:   09/17/17 1123  BP: 116/76  Pulse: 97  Temp: 98.2 F (36.8 C)  TempSrc: Oral  Weight: 171 lb (77.6 kg)   Body mass index is 23.19 kg/m.  Physical Exam  Constitutional: He is oriented to person, place, and time.  He is  pleasant and in good spirits.  HENT:  Mouth/Throat: No oropharyngeal exudate.  Eyes: Conjunctivae are normal.  Cardiovascular: Normal rate and regular rhythm.   No murmur heard. Pulmonary/Chest: Effort normal and breath sounds normal. He has no wheezes. He has no rales.  Abdominal: Soft. He exhibits no mass. There is no tenderness.  Musculoskeletal: Normal range of motion. He exhibits no edema or tenderness.  Neurological: He is alert and oriented to person, place, and time. Gait normal.  Skin: No rash noted.  Psychiatric: Mood and affect normal.    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 He will stay off of antibiotics and follow-up here in one month.        Unprioritized   Chronic systolic heart failure (HCC)   Depression    He can restart Wellbutrin now.      Hepatitis C antibody test positive    I will check his hepatitis C viral load and genotype to see if he needs treatment.      Relevant Orders   Hepatitis C genotype   Hepatitis C RNA quantitative   Liver Fibrosis, FibroTest-ActiTest   INR/PT   History of mitral valve replacement    I will make a  referral to [facility] heart care so he can establish with a cardiologist here in [place].      Polysubstance abuse (HCC)    I will have him speak with our counselor, Vergia Alberts, so that he can learn more about local resources for counseling for his drug addiction and depression.          [name], MD [facility] [phone] pager   [phone] cell 09/17/2017, 12:11 PM

## 2017-09-17 NOTE — Assessment & Plan Note (Signed)
I will check his hepatitis C viral load and genotype to see if he needs treatment.

## 2017-09-17 NOTE — Assessment & Plan Note (Signed)
I will have him speak with our counselor, Vergia Alberts, so that he can learn more about local resources for counseling for his drug addiction and depression.

## 2017-09-17 NOTE — Assessment & Plan Note (Signed)
I am hopeful that his MSSA endocarditis has been cured. He will stay off of antibiotics and follow-up here in one month.

## 2017-09-18 ENCOUNTER — Encounter (INDEPENDENT_AMBULATORY_CARE_PROVIDER_SITE_OTHER): Payer: Self-pay

## 2017-09-18 ENCOUNTER — Ambulatory Visit (INDEPENDENT_AMBULATORY_CARE_PROVIDER_SITE_OTHER): Payer: BLUE CROSS/BLUE SHIELD | Admitting: Cardiology

## 2017-09-18 ENCOUNTER — Encounter: Payer: Self-pay | Admitting: Cardiology

## 2017-09-18 ENCOUNTER — Telehealth: Payer: Self-pay | Admitting: *Deleted

## 2017-09-18 ENCOUNTER — Telehealth: Payer: Self-pay | Admitting: Licensed Clinical Social Worker

## 2017-09-18 VITALS — BP 110/54 | HR 88 | Ht 72.0 in | Wt 171.0 lb

## 2017-09-18 DIAGNOSIS — I5042 Chronic combined systolic (congestive) and diastolic (congestive) heart failure: Secondary | ICD-10-CM | POA: Diagnosis not present

## 2017-09-18 DIAGNOSIS — Z952 Presence of prosthetic heart valve: Secondary | ICD-10-CM

## 2017-09-18 DIAGNOSIS — T826XXD Infection and inflammatory reaction due to cardiac valve prosthesis, subsequent encounter: Secondary | ICD-10-CM | POA: Diagnosis not present

## 2017-09-18 DIAGNOSIS — I38 Endocarditis, valve unspecified: Secondary | ICD-10-CM

## 2017-09-18 DIAGNOSIS — I428 Other cardiomyopathies: Secondary | ICD-10-CM | POA: Insufficient documentation

## 2017-09-18 DIAGNOSIS — F1910 Other psychoactive substance abuse, uncomplicated: Secondary | ICD-10-CM

## 2017-09-18 NOTE — Progress Notes (Signed)
PCP: Street, [name], MD ID: Dr. Orvan Falconer  Clinic Note: Chief Complaint  Patient presents with  . Hospitalization Follow-up    Recent prosthetic Mitral valve endocaritis.  . New Patient (Initial Visit)  . 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 Cardiomyopathy    Valvular    HPI: Norman Reed is a 31 y.o. male who is being seen today to ESTABLISH CARDIOLOGY CARE at the request of Street, [name], * & Dr. [name]. Cardiac history:  November 2016: Initially Admitted to 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 Noted to have RV volume and pressure overload - likely related to mycotic pulmonary embolism (Dr. Reuel Boom)  Complicated by mycotic right-sided pulmonary embolism --> pulmonary thromboembolectomy at the time of valve surgery  Complicated  by A. 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 AI  Discharged on oral Linezolid salide and rifampin (refused SNF for IV Abx) --> readmitted 12 d later with F/C & aches, so he then agreed to go to stay as an inpatient for IV antibiotics (IV Cefazolin, Gentamicin & oral Rifampin) -- total 50 days of Abx (completed course on 9/25) -->  followed by Dr. 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  He now indicates a desire to leave [place] & move to [facility] to sign up for in-patient Drug Counseling.  Norman Reed was last seen by Dr. Reuel Boom (his original cardiologist) on 04/07/2016 --> was doing well on carvedilol, lisinopril and when necessary torsemide.    Seen by Dr. Orvan Falconer from ID on October 4: doing better. Asked for [place] Cardiologist.  Recent Hospitalizations:  8/12-21/2018: (Left AMA) - transferred from [facility] with MSSA endocarditis (was admitted there on August 8). Following 11 days of IV antibiotics, he left AMA, but did agree to taking oral antibiotics.  Was noted to have PICC line thrombosis  Readmitted 08/12/2017 - discharge 09/08/2017: Having gone home oral antibiotics he started having fevers chills and body aches. Blood cultures were negative (denied any further illicit drug use); not septic appearing.  - Completed course of IV antibiotics.  Was also initiate warfarin therapy for his mechanical valves and history of PE with recent line clot)   Studies Personally Reviewed - (if available, images/films reviewed: From Epic Chart or Care Everywhere)  Transthoracic echo 11/07/2015: ([facility]) EF 55-60%. Dilated RV with evidence of pressure and volume overload. Severe tricuspid regurgitation due to tricuspid valve vegetations. Dilated RV. Medium-sized pericardial fusion  Cardiac Cath Dec 2016 ([facility]) - report not available. Likely normal coronary arteries.   TTE 01/2016: EF 25-30%; inferior Akinesis & global HK.  Echo from May 2017 not available   Apparently he had an echocardiogram  at [facility] suggesting EF of 25-30% in early August 2018  TEE August 2018: EF 40-45% with diffuse hypokinesis. Moderate aortic regurgitation - with no vegetation noted. Small mechanical mitral  valve vegetation (6x14 mm) noted. No tricuspid vegetation noted. RV remains dilated with moderate severe dysfunction. Both atria are enlarged. Worsening aortic insufficiency.  Interval History: Norman Reed is a very pleasant young man with a very, located medical history requiring almost an hour to review. Today he comes in really without any major cardiac complaints. He does get exertional dyspnea if he exerts himself beyond routine exercise. But he does walk daily. He mostly notes just lack of stamina He has not had any PND, orthopnea or edema. He has not had any recurrence of any rapid irregular heartbeats or palpitations to suspect recurrence of A. fib. He denies any chest tightness or pressure with rest or exertion. He carries a diagnosis of RV failure that has been noticed on several echocardiograms after his PE, but denies any significant edema or abdominal swelling. No lightheadedness, dizziness, syncope/near-syncope or TIA/amaurosis fugax symptoms.   No melena, hematochezia, hematuria, or epstaxis. No claudication.  ROS: A comprehensive was performed. Review of Systems  Constitutional: Negative for chills and fever.       He seems to have recovered from his endocarditis  HENT: Negative for congestion.   Respiratory: Negative for cough, sputum production, shortness of breath and wheezing.   Cardiovascular:       Per history of present illness  Gastrointestinal: Negative for heartburn.  Musculoskeletal: Negative for falls and joint pain.  Endo/Heme/Allergies: Does not bruise/bleed easily.  Psychiatric/Behavioral: Positive for depression (Wellbutrin helps). The patient is nervous/anxious and has insomnia (Scared).        He seems to be coming to grips with his condition. He says he hit rock bottom the last hospitalization, and is now ready to make a move and go through rehabilitation to get off of his drugs.  All other systems reviewed and are negative.  I have reviewed and (if needed)  personally updated the patient's problem list, medications, allergies, past medical and surgical history, social and family history.   Past Medical History:  Diagnosis Date  . Anemia   . Anxiety   . Chronic systolic dysfunction of right ventricle 10/2015   Following large R sided TV Vegetation related PE  . Daily headache   . Depression    Restarted Welbutrin  . Hepatitis C    "not treated for it yet" (08/12/2017)  . 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  Marland Kitchen History of cardiac arrest ~ 01/2016   "in ER; 20 minutes after I was given MSO4; I'm not allergic to MSO4; might have gotten a little too much"  . 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 IV drug abuse (HCC)    Hoping to start In-patient counseling  . Myocardial infarction (HCC)    "X 2 a couple months ago; I ddidn't even know about them" (08/12/2017)  . 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  . 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    Past Surgical History:  Procedure  Laterality Date  . CARDIAC CATHETERIZATION  11/2015   [facility]: Pre-op MVR/TVR - no significant CAD (report not available)  . CARDIAC VALVE REPLACEMENT  11/2015   [facility]: Combined Mitral & Tricuspic Valve - Mechanical Prosthesis; Pulmonary Throbolectomy (R side).  . CARDIOVERSION  11/2015   Afib associated with MV & TV Endocarditis & mycotic pulmonary embolism (R lung)  . KNEE ARTHROSCOPY Left ~ 2009  . TEE WITHOUT CARDIOVERSION N/A 07/30/2017   Procedure: TRANSESOPHAGEAL ECHOCARDIOGRAM (TEE);  Surgeon: Dolores Patty, MD: EF 40-45% with diffuse hypokinesis. Moderate aortic regurgitation - with no vegetation  noted. Small mechanical mitral valve vegetation (6x14 mm) noted. No tricuspid vegetation noted. RV remains dilated with moderate severe dysfunction. Both atria are enlarged. Worsening aortic insufficiency.  . TRANSTHORACIC ECHOCARDIOGRAM  10/2015   [facility]: EF 55-60%. Dilated RV with evidence of pressure and volume overload. Severe tricuspid regurgitation due to tricuspid valve vegetations. Dilated RV. Medium-sized pericardial fusion  . TRANSTHORACIC ECHOCARDIOGRAM  01/2016   [facility]) Post Cardiac Arrest: EF ~25-30%. Inferior Akinesis & globak Hypokinesis    Current Meds  Medication Sig  . acetaminophen (TYLENOL) 325 MG tablet Take 650 mg by mouth every 6 (six) hours as needed for mild pain or fever.  . gabapentin (NEURONTIN) 600 MG tablet Take 0.5 tablets (300 mg total) by mouth 2 (two) times daily.  . Multiple Vitamins-Minerals (MULTIVITAMIN WITH MINERALS) tablet Take 1 tablet by mouth daily.  . nicotine (NICODERM CQ - DOSED IN MG/24 HOURS) 21 mg/24hr patch Place 1 patch (21 mg total) onto the skin daily.  . SUBOXONE 8-2 MG FILM Place 2 Film under the tongue daily.  Marland Kitchen warfarin (COUMADIN) 7.5 MG tablet Take 2 tablets (15 mg total) by mouth one time only at 6 PM.  . zolpidem (AMBIEN) 10 MG tablet Take 1 tablet (10 mg total) by mouth at bedtime as needed for sleep.  . [DISCONTINUED] ibuprofen (ADVIL,MOTRIN) 200 MG tablet Take 1 tablet (200 mg total) by mouth every 6 (six) hours as needed for fever or headache.  . [DISCONTINUED] lisinopril (PRINIVIL,ZESTRIL) 2.5 MG tablet Take 2.5 mg by mouth daily.  . [DISCONTINUED] Probiotic Product (PROBIOTIC PO) Take 1 tablet by mouth as needed (digestion). Taking while on antibiotics  . [DISCONTINUED] torsemide (DEMADEX) 10 MG tablet Take 10 mg by mouth daily.    Allergies  Allergen Reactions  . Penicillins Other (See Comments)    Childhood reaction    Social History   Social History  . Marital status: Single    Spouse name: N/A  . Number of  children: N/A  . Years of education: N/A   Social History Main Topics  . Smoking status: Current Every Day Smoker    Packs/day: 0.25    Years: 13.00    Types: Cigarettes  . Smokeless tobacco: Current User    Types: Chew  . Alcohol use No  . Drug use: Yes    Types: Methamphetamines, Marijuana, Other-see comments     Comment: 08/12/2017 "clean since 07/21/2017; no opiates for 5 yr"  . Sexual activity: Yes   Other Topics Concern  . None   Social History Narrative  . None    family history includes Hyperlipidemia in his maternal grandmother and paternal grandfather.  Wt Readings from Last 3 Encounters:  09/18/17 171 lb (77.6 kg)  09/17/17 171 lb (77.6 kg)  08/12/17 167 lb 11.2 oz (76.1 kg)    PHYSICAL EXAM BP (!) 110/54   Pulse 88   Ht 6' (1.829 m)  Wt 171 lb (77.6 kg)   BMI 23.19 kg/m  Physical Exam  Constitutional: He is oriented to person, place, and time. He appears well-developed and well-nourished. No distress.  Well-groomed. Pleasant  HENT:  Head: Normocephalic and atraumatic.  Mouth/Throat: No oropharyngeal exudate.  Eyes: Pupils are equal, round, and reactive to light. Conjunctivae and EOM are normal. No scleral icterus.  Neck: Normal range of motion. Neck supple. No hepatojugular reflux and no JVD present. Carotid bruit is not present.  Cardiovascular: Normal rate, regular rhythm, intact distal pulses and normal pulses.   No extrasystoles are present. PMI is not displaced.  Exam reveals no gallop (Cannot exclude gallop).   Murmur heard.  Medium-pitched harsh crescendo-decrescendo systolic murmur is present with a grade of 2/6  at the upper right sternal border radiating to the neck  Low-pitched rumbling decrescendo early diastolic murmur is present with a grade of 2/6  Mechanical S1 and normal S2. Very complex murmurs. In addition to the obvious aortic murmurs, there is another holosystolic murmur heard throughout that is difficult to determine based on the  baseline rapid heart rate and mechanical valves.  Pulmonary/Chest: Effort normal and breath sounds normal. No respiratory distress. He has no wheezes. He has no rales. He exhibits no tenderness.  Abdominal: Soft. Bowel sounds are normal. He exhibits no distension. There is no tenderness. There is no rebound.  Musculoskeletal: Normal range of motion. He exhibits no edema or deformity.  Neurological: He is alert and oriented to person, place, and time. No cranial nerve deficit. He exhibits normal muscle tone.  Skin: Skin is warm. No rash noted. No erythema. No pallor.  Psychiatric: He has a normal mood and affect. His behavior is normal. Judgment and thought content normal.     Adult ECG Report n/a  Other studies Reviewed: Additional studies/ records that were reviewed today include:  Recent Labs:   CBC Latest Ref Rng & Units 09/08/2017 09/07/2017 09/05/2017  WBC 4.0 - 10.5 K/uL 7.5 6.1 7.4  Hemoglobin 13.0 - 17.0 g/dL 0.4(V) 4.0(J) 10.1(L)  Hematocrit 39.0 - 52.0 % 31.2(L) 31.7(L) 32.0(L)  Platelets 150 - 400 K/uL 232 221 242   Lab Results  Component Value Date   CREATININE 0.80 09/07/2017   BUN 11 09/07/2017   NA 137 09/07/2017   K 4.2 09/07/2017   CL 104 09/07/2017   CO2 29 09/07/2017   ASSESSMENT / PLAN: Problem List Items Addressed This Visit    Chronic combined systolic and diastolic heart failure (HCC) (Chronic)    No active heart failure symptoms. He has no real edema or PND, orthopnea. At this point I think we'll probably restart carvedilol. He is not requiring any diuretic.  Plan: Recheck 2-D echo      History of mitral valve replacement (Chronic)    Mechanical mitral and tricuspid valve replacement. He needs to be on warfarin which he is now an INR being followed closely.  Recent mechanical mitral valve endocarditis. We will reassess with echocardiogram (TEE only if indicated)      Relevant Orders   ECHOCARDIOGRAM COMPLETE   History of tricuspid valve replacement  (Chronic)    Mechanical TVR - Also in the setting of endocarditis. Thankfully, this did not seem to be involved with the most recent endocarditis episode.  Following up echocardiogram      Relevant Orders   ECHOCARDIOGRAM COMPLETE   Polysubstance abuse (HCC) (Chronic)    IV drug use was the reason for both episodes of endocarditis. I explained to  him the life threatening severity of endocarditis and how lucky he is to have survived first the initial episode and then a second episode of endocarditis. Unfortunately now he is now left with valvular cardiomyopathy and RV failure. Unfortunately a lot of his addiction seems to be related to depression. He tells me that he is weeks rock bottom after this last hospitalization and is ready to make some major changes. He plans to move to [facility] in order to get away from [place] (study when living there he is close to his friends and the impetus to get back into drugs)  He hopes to get involved in inpatient rehabilitation center.       Relevant Orders   ECHOCARDIOGRAM COMPLETE   Prosthetic valve endocarditis (HCC) (Chronic)    He completed a prolonged course of antibiotics both oral and IV. Being followed by infectious disease. Repeat blood cultures have been negative and he is not showing any signs of sepsis or endocarditis sequelae.  Biggest issue years IV drug use which she simply needs to stop. We talked about this in detail.  We will reevaluate with an echocardiogram that will hopefully allow [place] to see his mitral valve, but I would not do a TEE unless otherwise indicated.      Relevant Orders   ECHOCARDIOGRAM COMPLETE   Valvular cardiomyopathy (HCC) - Primary (Chronic)    Status post both mitral and tricuspid valve replacement with mechanical prostheses in the setting of 2 valve endocarditis now complicated by mitral valve prosthetic endocarditis. He has had EF as low as 25%, but by TEE was 40-45%. Now that he has recovered from his  endocarditis having completed antibiotics, I would like to reevaluate his baseline transthoracic echocardiogram in order to best determine how to manage his medications.  PLAN:   Check new Baseline TTE  I will restart his carvedilol, and then on reevaluation will consider restarting his ACE inhibitor based on his low pressure.      Relevant Orders   ECHOCARDIOGRAM COMPLETE      Current medicines are reviewed at length with the patient today. (+/- concerns) n/a The following changes have been made: restart Carvedilol 3.125 mg daily.   Patient Instructions  Medication  Start taking carvedilol as prescribed  3.125 mg twice a day.    SCHEDULE AT [address] Your physician has requested that you have an echocardiogram. Echocardiography is a painless test that uses sound waves to create images of your heart. It provides your doctor with information about the size and shape of your heart and how well your heart's chambers and valves are working. This procedure takes approximately one hour. There are no restrictions for this procedure.     Your physician recommends that you schedule a follow-up appointment in 1 MONTH WITH DR Esmond Hinch.    Studies Ordered:   Orders Placed This Encounter  Procedures  . ECHOCARDIOGRAM COMPLETE      Bryan Lemma, M.D., M.S. Interventional Cardiologist   Pager # [phone] Phone # [phone] [address] [place], [place] [address]

## 2017-09-18 NOTE — Patient Instructions (Addendum)
Medication  Start taking carvedilol as prescribed  3.125 mg twice a day.    SCHEDULE AT [address] Your physician has requested that you have an echocardiogram. Echocardiography is a painless test that uses sound waves to create images of your heart. It provides your doctor with information about the size and shape of your heart and how well your heart's chambers and valves are working. This procedure takes approximately one hour. There are no restrictions for this procedure.     Your physician recommends that you schedule a follow-up appointment in 1 MONTH WITH DR HARDING.

## 2017-09-18 NOTE — Telephone Encounter (Signed)
[facility] called to report that the patient INR was 6.1 and PT 64 from draw 09/17/17. Advised will inform the provider.

## 2017-09-18 NOTE — Telephone Encounter (Signed)
[facility] spoke with the patient's mother and attempted to schedule an appointment for next week.  The mother reported that she was busy at work but was aware that I would be calling.  She reported that she will call back on Monday to discuss arrangements.  Vergia Alberts, [facility]

## 2017-09-19 ENCOUNTER — Telehealth: Payer: Self-pay | Admitting: Internal Medicine

## 2017-09-19 LAB — LIVER FIBROSIS, FIBROTEST-ACTITEST
ALPHA-2-MACROGLOBULIN: 212 mg/dL (ref 106–279)
ALT: 15 U/L (ref 9–46)
APOLIPOPROTEIN A1: 75 mg/dL — AB (ref 94–176)
BILIRUBIN: 0.6 mg/dL (ref 0.2–1.2)
FIBROSIS SCORE: 0.32
GGT: 63 U/L (ref 3–70)
HAPTOGLOBIN: 239 mg/dL — AB (ref 43–212)
NECROINFLAMMAT ACT SCORE: 0.05
REFERENCE ID: 2146360

## 2017-09-19 NOTE — Telephone Encounter (Signed)
I called and spoke with Hillman's mother today about his elevated INR. She was not at home with him when we spoke and Kalon was not answering his cell phone. I suspect that his elevated INR is related to having had his Coumadin dose increased while he was on rifampin and it was not decreased back to his baseline when the rifampin was stopped. Berthel's mother was going to go home and then called me so I could review this with [name].

## 2017-09-19 NOTE — Telephone Encounter (Signed)
I was finally able to speak with Norman Reed a few minutes ago. As I suspected he is now on 15 mg of Coumadin daily. He was on 8 mg daily before he was placed on rifampin. Unfortunately, he has already taken his Coumadin today. I told him to hold his Coumadin. He said that he could go get his INR rechecked on Monday with his primary care provider, Dr. [name]. I will speak with our pharmacist on Monday and get back in touch with him to help determine when to restart his Coumadin and at what dose.

## 2017-09-20 ENCOUNTER — Encounter: Payer: Self-pay | Admitting: Cardiology

## 2017-09-20 NOTE — Assessment & Plan Note (Signed)
Mechanical mitral and tricuspid valve replacement. He needs to be on warfarin which he is now an INR being followed closely.  Recent mechanical mitral valve endocarditis. We will reassess with echocardiogram (TEE only if indicated)

## 2017-09-20 NOTE — Assessment & Plan Note (Signed)
He completed a prolonged course of antibiotics both oral and IV. Being followed by infectious disease. Repeat blood cultures have been negative and he is not showing any signs of sepsis or endocarditis sequelae.  Biggest issue years IV drug use which she simply needs to stop. We talked about this in detail.  We will reevaluate with an echocardiogram that will hopefully allow [place] to see his mitral valve, but I would not do a TEE unless otherwise indicated.

## 2017-09-20 NOTE — Assessment & Plan Note (Signed)
IV drug use was the reason for both episodes of endocarditis. I explained to him the life threatening severity of endocarditis and how lucky he is to have survived first the initial episode and then a second episode of endocarditis. Unfortunately now he is now left with valvular cardiomyopathy and RV failure. Unfortunately a lot of his addiction seems to be related to depression. He tells me that he is weeks rock bottom after this last hospitalization and is ready to make some major changes. He plans to move to [facility] in order to get away from [place] (study when living there he is close to his friends and the impetus to get back into drugs)  He hopes to get involved in inpatient rehabilitation center.

## 2017-09-20 NOTE — Assessment & Plan Note (Signed)
Mechanical TVR - Also in the setting of endocarditis. Thankfully, this did not seem to be involved with the most recent endocarditis episode.  Following up echocardiogram

## 2017-09-20 NOTE — Assessment & Plan Note (Signed)
Status post both mitral and tricuspid valve replacement with mechanical prostheses in the setting of 2 valve endocarditis now complicated by mitral valve prosthetic endocarditis. He has had EF as low as 25%, but by TEE was 40-45%. Now that he has recovered from his endocarditis having completed antibiotics, I would like to reevaluate his baseline transthoracic echocardiogram in order to best determine how to manage his medications.  PLAN:   Check new Baseline TTE  I will restart his carvedilol, and then on reevaluation will consider restarting his ACE inhibitor based on his low pressure.

## 2017-09-20 NOTE — Assessment & Plan Note (Signed)
No active heart failure symptoms. He has no real edema or PND, orthopnea. At this point I think we'll probably restart carvedilol. He is not requiring any diuretic.  Plan: Recheck 2-D echo

## 2017-09-21 ENCOUNTER — Other Ambulatory Visit: Payer: Self-pay | Admitting: Pharmacist

## 2017-09-21 LAB — PROTIME-INR
INR: 6.1 — ABNORMAL HIGH
Prothrombin Time: 64 s — ABNORMAL HIGH (ref 9.0–11.5)

## 2017-09-21 LAB — HEPATITIS C GENOTYPE: HCV Genotype: NOT DETECTED

## 2017-09-21 LAB — HEPATITIS C RNA QUANTITATIVE
HCV Quantitative Log: <1.18 Log IU/mL
HCV RNA, PCR, QN: <15 IU/mL

## 2017-09-22 ENCOUNTER — Ambulatory Visit (INDEPENDENT_AMBULATORY_CARE_PROVIDER_SITE_OTHER): Payer: BLUE CROSS/BLUE SHIELD | Admitting: Internal Medicine

## 2017-09-22 ENCOUNTER — Encounter: Payer: Self-pay | Admitting: Internal Medicine

## 2017-09-22 VITALS — BP 101/53 | HR 78 | Temp 98.3°F | Ht 72.0 in | Wt 173.9 lb

## 2017-09-22 DIAGNOSIS — Z8619 Personal history of other infectious and parasitic diseases: Secondary | ICD-10-CM | POA: Diagnosis not present

## 2017-09-22 DIAGNOSIS — F1123 Opioid dependence with withdrawal: Secondary | ICD-10-CM

## 2017-09-22 DIAGNOSIS — I5020 Unspecified systolic (congestive) heart failure: Secondary | ICD-10-CM | POA: Diagnosis not present

## 2017-09-22 DIAGNOSIS — R011 Cardiac murmur, unspecified: Secondary | ICD-10-CM | POA: Diagnosis not present

## 2017-09-22 DIAGNOSIS — B1920 Unspecified viral hepatitis C without hepatic coma: Secondary | ICD-10-CM

## 2017-09-22 DIAGNOSIS — F1199 Opioid use, unspecified with unspecified opioid-induced disorder: Secondary | ICD-10-CM

## 2017-09-22 DIAGNOSIS — Z8679 Personal history of other diseases of the circulatory system: Secondary | ICD-10-CM | POA: Diagnosis not present

## 2017-09-22 DIAGNOSIS — F1190 Opioid use, unspecified, uncomplicated: Secondary | ICD-10-CM

## 2017-09-22 MED ORDER — BUPRENORPHINE HCL-NALOXONE HCL 8-2 MG SL FILM: 2.0000 | ORAL_FILM | Freq: Every day | SUBLINGUAL | 0 refills | Status: DC

## 2017-09-22 NOTE — Assessment & Plan Note (Addendum)
31 yo M here for follow up of severe opioid use disorder. Patient has a history of IVDU complicated by MSSA endocarditis s/p mitral and tricuspid valve repair in 2016. He was recently discharged on 09/08/17 after completing 4 weeks of IV antibiotics for recurrent MSSA bacteremia with a new mechanical valve vegetation seen on echocardiogram. He was inducted on suboxone therapy while hospitalized and did very well on 16 mg of buprenorphine daily. Unfortunately, patient missed his initial follow up appointment for suboxone therapy due to incorrect provider information given at discharge. He eventually located our clinic by looking up the provider name on his prescription bottle. Since running out of his prescription, he reports treating his withdrawal symptoms with illicitly purchased suboxone. He has been cutting the films in half to make them last. Last took 1/2 a film yesterday morning. Today he reports symptoms of withdrawal including GI upset, fatigue, tremor, and diaphoresis. He endorses cravings but denies relapse.  -- Refilled suboxone 8-2 mg, 2 films daily (#14 films provided) -- F/u UDS -- F/u 1 week, will need OUD pain contract   ADDENDUM: UDS positive for buprenorphine & metabolite, but inapropriate for methamphetamine, amphetamine, and Ambien. F/u scheduled tomorrow 10/6.

## 2017-09-22 NOTE — Progress Notes (Signed)
   09/22/2017  Norman Reed for follow up of opioid use disorder I have reviewed the prior induction visit, follow up visits, and telephone encounters relevant to opiate use disorder (OUD) treatment.   Current daily dose: 16 mg buprenorphine   Date of Induction: 07/28/2017  Current follow up interval, in weeks: 1 week   The Norman Reed does not yet have an OUD contract.   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 Norman Reed reported that he started abusing opioids at the age of 82. His addiction started with pills and progressed to injecting IV heroin. 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 He was abstinent for about 6 months before he began to use again. He reported purchasing illicit suboxone at times to keep himself out of withdrawal and felt that it worked well for him in reducing cravings. In August of 2018, he developed recurrent MSSA bacteremia with a new mechanical mitral valve vegetation visualized on echocardiogram. While hospitalized, Norman Reed went into acute opioid withdrawal and Dr. Oswaldo Done was consulted for suboxone therapy. He was inducted on buprenorphine therapy on 07/28/2017 and did very well on 16 mg of buprenorphine daily while in the hospital. He was discharged on 09/08/2017 after completing his course of IV antibiotic. Unfortunately, Norman Reed missed his initial follow up appointment for suboxone therapy due to incorrect provider information that was listed on his AVS discharge paperwork. [name] today for initial follow up.   Since discharge, Norman Reed has run out of his suboxone prescription and reports symptoms of withdrawal. He endorses cravings but denies relapse. He has been purchasing illicit suboxone to treat his withdrawal symptoms, but has been cutting the flims in half to  make them last. He reports feeling ill with diaphoresis, GI upset, and general malaise.    Exam:   Vitals:   09/22/17 1521  BP: (!) 101/53  Pulse: 78  Temp: 98.3 F (36.8 C)  TempSrc: Oral  SpO2: 100%  Weight: 173 lb 14.4 oz (78.9 kg)  Height: 6' (1.829 m)    Physical Exam Constitutional: Mildly diaphoretic to touch, pale appearing Cardiovascular: RRR, systolic murmur   Pulmonary/Chest: CTAB, no wheezes, rales, or rhonchi.  Extremities: Warm and well perfused. No edema.  Psychiatric: Normal mood, flat affect  Assessment/Plan:  See Problem Based Charting in the Encounters Tab  Reymundo Poll, MD  09/22/2017  3:53 PM

## 2017-09-22 NOTE — Progress Notes (Signed)
Internal Medicine Clinic Attending  I saw and evaluated the patient.  I personally confirmed the key portions of the history and exam documented by Dr. Antony Contras and I reviewed pertinent patient test results.  The assessment, diagnosis, and plan were formulated together and I agree with the documentation in the resident's note.  Young man with severe OUD here for symptoms of opioid withdrawal. He has been out of suboxone for several days because of a miscommunication about follow up. He appears to be in mild withdrawal today, uncomfortable, irritable, diaphoretic, nausea. Plan is to restart suboxone at 16mg  daily, follow up with [place] in one week. He denies illicits, which we will confirm with u tox today.

## 2017-09-22 NOTE — Patient Instructions (Addendum)
Mr. Nathe,  It was a pleasure to see you today. I am sorry for the confusion with your appointment. Please continue to take suboxone 8-2 mg, 2 films daily. Please follow up with [place] in 1 week. If you have any questions or concerns, call our clinic at [phone] or after hours call [phone] and ask for the internal medicine resident on call. Thank you!  - Dr. Antony Contras

## 2017-09-23 ENCOUNTER — Telehealth: Payer: Self-pay | Admitting: Pharmacist

## 2017-09-23 ENCOUNTER — Other Ambulatory Visit: Payer: Self-pay | Admitting: Pharmacist

## 2017-09-23 NOTE — Telephone Encounter (Signed)
Spoke with [name] this AM regarding his INR.  He had it rechecked on Monday and it was 5.3.  He is still holding his warfarin.  He is going back to his PCP this morning to get it rechecked.  He said his PCP is going to be the one who adjusts his dose for him.  I asked him to call me back and let me know what his repeat INR is this morning and he said he would do so.

## 2017-09-25 ENCOUNTER — Ambulatory Visit (HOSPITAL_COMMUNITY): Payer: BLUE CROSS/BLUE SHIELD | Attending: Internal Medicine

## 2017-09-25 ENCOUNTER — Other Ambulatory Visit: Payer: Self-pay

## 2017-09-25 DIAGNOSIS — I428 Other cardiomyopathies: Secondary | ICD-10-CM

## 2017-09-25 DIAGNOSIS — I351 Nonrheumatic aortic (valve) insufficiency: Secondary | ICD-10-CM | POA: Insufficient documentation

## 2017-09-25 DIAGNOSIS — Z952 Presence of prosthetic heart valve: Secondary | ICD-10-CM | POA: Diagnosis not present

## 2017-09-25 DIAGNOSIS — T826XXD Infection and inflammatory reaction due to cardiac valve prosthesis, subsequent encounter: Secondary | ICD-10-CM | POA: Diagnosis not present

## 2017-09-25 DIAGNOSIS — Z953 Presence of xenogenic heart valve: Secondary | ICD-10-CM | POA: Diagnosis not present

## 2017-09-25 DIAGNOSIS — I38 Endocarditis, valve unspecified: Secondary | ICD-10-CM

## 2017-09-25 DIAGNOSIS — F1910 Other psychoactive substance abuse, uncomplicated: Secondary | ICD-10-CM | POA: Diagnosis not present

## 2017-09-28 LAB — TOXASSURE SELECT,+ANTIDEPR,UR

## 2017-09-29 ENCOUNTER — Ambulatory Visit (INDEPENDENT_AMBULATORY_CARE_PROVIDER_SITE_OTHER): Payer: BLUE CROSS/BLUE SHIELD | Admitting: Internal Medicine

## 2017-09-29 ENCOUNTER — Telehealth: Payer: Self-pay | Admitting: *Deleted

## 2017-09-29 VITALS — BP 112/56 | HR 79 | Temp 97.3°F | Resp 20 | Wt 177.1 lb

## 2017-09-29 DIAGNOSIS — Z79899 Other long term (current) drug therapy: Secondary | ICD-10-CM

## 2017-09-29 DIAGNOSIS — F1190 Opioid use, unspecified, uncomplicated: Secondary | ICD-10-CM

## 2017-09-29 DIAGNOSIS — F1120 Opioid dependence, uncomplicated: Secondary | ICD-10-CM

## 2017-09-29 DIAGNOSIS — I5020 Unspecified systolic (congestive) heart failure: Secondary | ICD-10-CM | POA: Diagnosis not present

## 2017-09-29 DIAGNOSIS — Z8619 Personal history of other infectious and parasitic diseases: Secondary | ICD-10-CM | POA: Diagnosis not present

## 2017-09-29 DIAGNOSIS — R011 Cardiac murmur, unspecified: Secondary | ICD-10-CM | POA: Diagnosis not present

## 2017-09-29 DIAGNOSIS — B1920 Unspecified viral hepatitis C without hepatic coma: Secondary | ICD-10-CM | POA: Diagnosis not present

## 2017-09-29 DIAGNOSIS — F329 Major depressive disorder, single episode, unspecified: Secondary | ICD-10-CM | POA: Diagnosis not present

## 2017-09-29 DIAGNOSIS — F1199 Opioid use, unspecified with unspecified opioid-induced disorder: Secondary | ICD-10-CM

## 2017-09-29 DIAGNOSIS — F32A Depression, unspecified: Secondary | ICD-10-CM

## 2017-09-29 MED ORDER — BUPROPION HCL 75 MG PO TABS: 75.0000 mg | ORAL_TABLET | Freq: Three times a day (TID) | ORAL | 2 refills | Status: AC

## 2017-09-29 MED ORDER — BUPRENORPHINE HCL-NALOXONE HCL 8-2 MG SL FILM: 2.0000 | ORAL_FILM | Freq: Every day | SUBLINGUAL | 0 refills | Status: DC

## 2017-09-29 NOTE — Telephone Encounter (Signed)
Result given  Office appointment 10/23/17

## 2017-09-29 NOTE — Progress Notes (Signed)
   09/29/2017  Norman Reed presents for follow up of opioid use disorder I have reviewed the prior induction visit, follow up visits, and telephone encounters relevant to opiate use disorder (OUD) treatment.   Current daily dose: 16mg /day.  Date of Induction: 07/28/17  Current follow up interval, in weeks: 1 week  The patient has not been adherent with the buprenorphine for OUD contract.  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  Norman Reed presents for follow up of his OUD.  Norman Reed has follow up with Cardiology on 10/23/17.  Today, Norman Reed reports that Norman Reed is doing well.  Norman Reed denies any illicit substance use this last week.  Norman Reed also denied amphetamine use that was present on his last UDS.  Norman Reed has been taking the buprenorphine BID and that seems to help him.  Norman Reed has no cravings or withdrawal.  Norman Reed is getting the suboxone through his insurance company and does not have financial issues.  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  Norman Reed has his mother in the room with him who provides support.  Norman Reed is also going to NA meetings and avoiding hanging out in crowds where drug use is normal.  Norman Reed is following with Cardiology and reports seeing ID in the past.  Norman Reed sees Dr. [name] as his PCP who prescribes him Wellbutrin for depression.  Norman Reed has started taking this again.  Norman Reed also has a small Rx of Ambien from his last hospital stay which Norman Reed takes up to 3 times per week when Norman Reed is having trouble sleeping.  Overall, Norman Reed looks well and reports doing well on current therapy.   Exam:   Vitals:   09/29/17 1000  BP: (!) 112/56  Pulse: 79  Resp: 20  Temp: (!) 97.3 F (36.3 C)  TempSrc: Oral  SpO2: 100%  Weight: 177 lb 1.6 oz (80.3 kg)    Gen: Alert, well appearing CV: RR, NR, +murmur Pulm: CTAB, no  wheezing Skin: No rash or wounds Psych: Conversant, normal mood.   Assessment/Plan:  See Problem Based Charting in the Encounters Tab     Norman Catalina, MD  09/29/2017  10:04 AM

## 2017-09-29 NOTE — Telephone Encounter (Signed)
-----   Message from Marykay Lex, MD sent at 09/27/2017 11:09 PM EDT ----- Transthoracic echocardiogram result: Both valves looked great, and the pump function is up a little bit to 45-50% from last echo.  Ejection fraction looks great. Currently 45-50%.  The septal wall motion is discordant which goes along with having open heart valve surgery. The aortic valve has some calcification and is mildly leaky, but stable. Mitral valve looks good with bioprosthetic valve functioning well. Also the tricuspid valve looks good and stable.  Bryan Lemma, MD  pls forward to: PCP Street, [name], MD

## 2017-09-29 NOTE — Patient Instructions (Signed)
Norman Reed, it was a pleasure to see you today.  Please come back to the clinic in 1 week.

## 2017-09-30 NOTE — Assessment & Plan Note (Signed)
Reports he was recently restarted on his wellbutrin by his PCP Dr. [name] and feels this is helping his symptoms.   Plan Continue follow up with PCP, wellbutrin.

## 2017-09-30 NOTE — Assessment & Plan Note (Signed)
Doing well on therapy.  UDS continued to be abnormal at last check, but this is only his 2nd appointment in our clinic.  He denies any opioid or other illicit substance use.  He is attending NA meetings for support.  [place] narcotic database appropriate.    Plan Continue Buprenorphine-naloxone 8-2mg  BID Follow up in 1 week Follow up UDS

## 2017-10-05 LAB — TOXASSURE SELECT,+ANTIDEPR,UR

## 2017-10-06 ENCOUNTER — Other Ambulatory Visit: Payer: Self-pay

## 2017-10-06 MED ORDER — BUPRENORPHINE HCL-NALOXONE HCL 8-2 MG SL FILM: 2.0000 | ORAL_FILM | Freq: Every day | SUBLINGUAL | 0 refills | Status: AC

## 2017-10-06 NOTE — Telephone Encounter (Signed)
Called to [facility] [address] [place]

## 2017-10-06 NOTE — Telephone Encounter (Signed)
Pt's mother calls and states pt has cold, fever, "snotty". Would like to reschedule for f/u oud, 10/30 @1045 . Will call script to [facility] n [address] Rosalita Levan

## 2017-10-06 NOTE — Telephone Encounter (Signed)
Would like Helen to call back about R/S appt for OUD and meds. Please call back.

## 2017-10-06 NOTE — Telephone Encounter (Signed)
Reviewed last visit and UDS.  Utox appropriate.  Will allow 1 time exception with follow up next week.  1 week supply of buprenorphine-naloxone will be provided.  Utox at next visit.

## 2017-10-23 ENCOUNTER — Encounter: Payer: Self-pay | Admitting: *Deleted

## 2017-10-23 ENCOUNTER — Ambulatory Visit: Payer: BLUE CROSS/BLUE SHIELD | Admitting: Cardiology

## 2017-10-23 NOTE — Progress Notes (Deleted)
PCP: Street, Norman Coup, MD ID: Dr. Orvan Reed  Clinic Note: No chief complaint on file.   HPI: Norman Reed is a 31 y.o. male who is being seen today to for follow-up of valvular cardiomyopathy with history of mitral and tricuspid valve replacement/repair in the setting of endocarditis now recently treated for prosthetic valve endocarditis in August 2018.  He was initially seen on September 18, 2017 at the request of Street, Norman Reed, * & Dr. Cliffton Reed. Cardiac History:  November 2016: Initially Admitted to [facility] - MSSA bacteremia, and tricuspid plus mitral valve endocarditis related to intravenous drug use. Noted to have RV volume and pressure overload - likely related to mycotic pulmonary embolism (Dr. Reuel Reed)  Complicated by mycotic right-sided pulmonary embolism --> pulmonary thromboembolectomy at the time of valve surgery  Complicated  by A. fib requiring cardioversion  December 2016: Transferred to [facility] for cardiac surgery (had preop cardiac catheterization) --> Combined Mitral and Tricuspid Valve Replacement with Mechanical Valves   Postop echo February 2017 showed severely reduced ejection fraction - valvular cardiomyopathy: EF 25-30%  He indicated that he quit smoking cigarettes at that time and had tried to abstain from drugs;   Cardiac arrest February 2017 --> LifeVest for EF of 20%  August 2018 ([facility] - transferred from [facility]): Recurrent endocarditis now of the mechanical prosthetic mitral valve -->   TEE showed EF 40-45%, RV dilated with Mod-Severe dysfunction & Biatrial enlargement. AI  Discharged on oral Linezolid salide and rifampin (refused SNF for IV Abx) --> readmitted 12 d later with F/C & aches, so he then agreed to go to stay as an inpatient for IV antibiotics (IV Cefazolin, Gentamicin & oral Rifampin) -- total 50 days of Abx (completed course on 9/25) --> followed by Dr. 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  He now indicates a desire to leave [place] & move to [facility] to sign up for in-patient Drug Counseling.  TTE September 25, 2017: EF 45-50%.  Discordant septal motion secondary to post surgery.  Aortic sclerosis no stenosis.  Bio-Statin mitral valve with no obstruction or vegetation.  Bioprosthetic tricuspid valve with no obstruction or vegetation.   Recent Hospitalizations: none since last admission  Readmitted 08/12/2017 - discharge 09/08/2017: -This was for prosthetic valve endocarditis.  Has now completed full antibiotic course.  Norman Reed was initially seen here on September 19, 2015 to establish a cardiologist here in [place].  He seemed to be quite insistent that he is working on drug rehab and is wanting his life back on track.  We restarted carvedilol that have been stopped in the hospital but I do not restart ACE inhibitor due to low blood pressure.   Studies Personally Reviewed - (if available, images/films reviewed: From Epic Chart or Care Everywhere)  TTE September 25, 2017: EF 45-50%.  Discordant septal motion secondary to post surgery.  Aortic sclerosis no stenosis.  Bio-Statin mitral valve with no obstruction or vegetation.  Bioprosthetic tricuspid valve with no obstruction or vegetation.  Interval History:  Norman Reed is a very pleasant young man with a very complicated medical history requiring almost an hour to review. Today he comes in really without any major cardiac complaints. He does get exertional dyspnea if he exerts himself beyond routine exercise. But he does walk daily. He mostly notes just lack of stamina He has not had any PND, orthopnea or  edema. He has not had any recurrence of any rapid irregular heartbeats or palpitations to suspect recurrence of A. fib. He denies any chest tightness or pressure with rest or exertion. He carries a diagnosis of RV failure  that has been noticed on several echocardiograms after his PE, but denies any significant edema or abdominal swelling. No lightheadedness, dizziness, syncope/near-syncope or TIA/amaurosis fugax symptoms.   No melena, hematochezia, hematuria, or epstaxis. No claudication.  ROS: A comprehensive was performed. Review of Systems  Constitutional: Negative for chills and fever.       He seems to have recovered from his endocarditis  HENT: Negative for congestion.   Respiratory: Negative for cough, sputum production, shortness of breath and wheezing.   Cardiovascular:       Per history of present illness  Gastrointestinal: Negative for heartburn.  Musculoskeletal: Negative for falls and joint pain.  Endo/Heme/Allergies: Does not bruise/bleed easily.  Psychiatric/Behavioral: Positive for depression (Wellbutrin helps). The patient is nervous/anxious and has insomnia (Scared).        He seems to be coming to grips with his condition. He says he hit rock bottom the last hospitalization, and is now ready to make a move and go through rehabilitation to get off of his drugs.  All other systems reviewed and are negative.  I have reviewed and (if needed) personally updated the patient's problem list, medications, allergies, past medical and surgical history, social and family history.   Past Medical History:  Diagnosis Date  . Anemia   . Anxiety   . Chronic systolic dysfunction of right ventricle 10/2015   Following large R sided TV Vegetation related PE  . Daily headache   . Depression    Restarted Welbutrin  . Hepatitis C    "not treated for it yet" (08/12/2017)  . 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  Marland Kitchen History of cardiac arrest ~ 01/2016   "in ER; 20 minutes after I was given MSO4; I'm not allergic to MSO4; might have gotten a  little too much"  . 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 IV drug abuse (HCC)    Hoping to start In-patient counseling  . Myocardial infarction (HCC)    "X 2 a couple months ago; I ddidn't even know about them" (08/12/2017)  . 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  . 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    Past Surgical History:  Procedure Laterality Date  . CARDIAC CATHETERIZATION  11/2015   [facility]: Pre-op MVR/TVR - no significant CAD (report not available)  . CARDIAC VALVE REPLACEMENT  11/2015   [facility]: Combined Mitral & Tricuspic Valve - Mechanical Prosthesis; Pulmonary Throbolectomy (R side).  . CARDIOVERSION  11/2015   Afib associated with MV & TV Endocarditis & mycotic pulmonary embolism (R lung)  . KNEE ARTHROSCOPY Left ~ 2009  . TRANSTHORACIC ECHOCARDIOGRAM  10/2015   [facility]: EF 55-60%. Dilated RV with evidence of pressure and volume overload. Severe tricuspid regurgitation due to tricuspid valve vegetations. Dilated RV. Medium-sized pericardial fusion  . TRANSTHORACIC ECHOCARDIOGRAM  01/2016   [facility]) Post Cardiac Arrest: EF ~25-30%. Inferior  Akinesis & globak Hypokinesis   Recent Cardiac Studies:    Transthoracic Echo (TTE) 11/07/2015: ([facility]) EF 55-60%. Dilated RV with evidence of pressure and volume overload. Severe tricuspid regurgitation due to tricuspid valve vegetations. Dilated RV. Medium-sized pericardial fusion  Cardiac Cath Dec 2016 ([facility]) - report not available. Likely normal coronary arteries.   TTE 01/2016: EF 25-30%; inferior Akinesis & global HK.  Echo from May 2017 not available   Apparently he had an echocardiogram at [facility] suggesting EF of 25-30% in early August 2018 TEE August 2018: EF 40-45% with diffuse hypokinesis. Moderate aortic  regurgitation - with no vegetation noted. Small mechanical mitral valve vegetation (6x14 mm) noted. No tricuspid vegetation noted. RV remains dilated with moderate severe dysfunction. Both atria are enlarged. Worsening aortic insufficiency.    No outpatient medications have been marked as taking for the 10/23/17 encounter (Appointment) with Marykay LexHarding, Tanee Henery W, MD.    Allergies  Allergen Reactions  . Penicillins Other (See Comments)    Childhood reaction    Social History   Socioeconomic History  . Marital status: Single    Spouse name: Not on file  . Number of children: Not on file  . Years of education: Not on file  . Highest education level: Not on file  Social Needs  . Financial resource strain: Not on file  . Food insecurity - worry: Not on file  . Food insecurity - inability: Not on file  . Transportation needs - medical: Not on file  . Transportation needs - non-medical: Not on file  Occupational History  . Not on file  Tobacco Use  . Smoking status: Current Every Day Smoker    Packs/day: 0.25    Years: 13.00    Pack years: 3.25    Types: Cigarettes  . Smokeless tobacco: Current User    Types: Chew  Substance and Sexual Activity  . Alcohol use: No  . Drug use: Yes    Types: Methamphetamines, Marijuana, Other-see comments    Comment: 08/12/2017 "clean since 07/21/2017; no opiates for 5 yr"  . Sexual activity: Yes  Other Topics Concern  . Not on file  Social History Narrative  . Not on file    family history includes Hyperlipidemia in his maternal grandmother and paternal grandfather.  Wt Readings from Last 3 Encounters:  09/29/17 177 lb 1.6 oz (80.3 kg)  09/22/17 173 lb 14.4 oz (78.9 kg)  09/18/17 171 lb (77.6 kg)    PHYSICAL EXAM There were no vitals taken for this visit. Physical Exam  Constitutional: He is oriented to person, place, and time. He appears well-developed and well-nourished. No distress.  Well-groomed. Pleasant  HENT:  Head: Normocephalic  and atraumatic.  Mouth/Throat: No oropharyngeal exudate.  Eyes: Conjunctivae and EOM are normal. Pupils are equal, round, and reactive to light. No scleral icterus.  Neck: Normal range of motion. Neck supple. No hepatojugular reflux and no JVD present. Carotid bruit is not present.  Cardiovascular: Normal rate, regular rhythm, intact distal pulses and normal pulses.  No extrasystoles are present. PMI is not displaced. Exam reveals no gallop (Cannot exclude gallop).  Murmur heard.  Medium-pitched harsh crescendo-decrescendo systolic murmur is present with a grade of 2/6 at the upper right sternal border radiating to the neck.  Low-pitched rumbling decrescendo early diastolic murmur is present with a grade of 2/6. Mechanical S1 and normal S2. Very complex murmurs. In addition to the obvious aortic murmurs, there is another holosystolic murmur heard throughout that is difficult  to determine based on the baseline rapid heart rate and mechanical valves.  Pulmonary/Chest: Effort normal and breath sounds normal. No respiratory distress. He has no wheezes. He has no rales. He exhibits no tenderness.  Abdominal: Soft. Bowel sounds are normal. He exhibits no distension. There is no tenderness. There is no rebound.  Musculoskeletal: Normal range of motion. He exhibits no edema or deformity.  Neurological: He is alert and oriented to person, place, and time. No cranial nerve deficit. He exhibits normal muscle tone.  Skin: Skin is warm. No rash noted. No erythema. No pallor.  Psychiatric: He has a normal mood and affect. His behavior is normal. Judgment and thought content normal.    Adult ECG Report n/a  Other studies Reviewed: Additional studies/ records that were reviewed today include:  Recent Labs:   CBC Latest Ref Rng & Units 09/08/2017 09/07/2017 09/05/2017  WBC 4.0 - 10.5 K/uL 7.5 6.1 7.4  Hemoglobin 13.0 - 17.0 g/dL 1.6(X) 0.9(U) 10.1(L)  Hematocrit 39.0 - 52.0 % 31.2(L) 31.7(L) 32.0(L)  Platelets  150 - 400 K/uL 232 221 242   Lab Results  Component Value Date   CREATININE 0.80 09/07/2017   BUN 11 09/07/2017   NA 137 09/07/2017   K 4.2 09/07/2017   CL 104 09/07/2017   CO2 29 09/07/2017   ASSESSMENT / PLAN: Problem List Items Addressed This Visit    None      Current medicines are reviewed at length with the patient today. (+/- concerns) n/a The following changes have been made: restart Carvedilol 3.125 mg daily.   There are no Patient Instructions on file for this visit.  Studies Ordered:   No orders of the defined types were placed in this encounter.     Bryan Lemma, M.D., M.S. Interventional Cardiologist   Pager # [phone] Phone # [phone] [address] [place], [place] [address]

## 2017-12-05 ENCOUNTER — Emergency Department (HOSPITAL_COMMUNITY): Payer: BLUE CROSS/BLUE SHIELD

## 2017-12-05 ENCOUNTER — Inpatient Hospital Stay (HOSPITAL_COMMUNITY): Payer: BLUE CROSS/BLUE SHIELD

## 2017-12-05 ENCOUNTER — Inpatient Hospital Stay (HOSPITAL_COMMUNITY)
Admission: EM | Admit: 2017-12-05 | Discharge: 2017-12-15 | DRG: 314 | Disposition: E | Payer: BLUE CROSS/BLUE SHIELD | Attending: Pulmonary Disease | Admitting: Pulmonary Disease

## 2017-12-05 DIAGNOSIS — I252 Old myocardial infarction: Secondary | ICD-10-CM | POA: Diagnosis not present

## 2017-12-05 DIAGNOSIS — I4891 Unspecified atrial fibrillation: Secondary | ICD-10-CM | POA: Diagnosis present

## 2017-12-05 DIAGNOSIS — Z9911 Dependence on respirator [ventilator] status: Secondary | ICD-10-CM

## 2017-12-05 DIAGNOSIS — F17210 Nicotine dependence, cigarettes, uncomplicated: Secondary | ICD-10-CM | POA: Diagnosis present

## 2017-12-05 DIAGNOSIS — A4102 Sepsis due to Methicillin resistant Staphylococcus aureus: Secondary | ICD-10-CM | POA: Diagnosis present

## 2017-12-05 DIAGNOSIS — I2510 Atherosclerotic heart disease of native coronary artery without angina pectoris: Secondary | ICD-10-CM | POA: Diagnosis present

## 2017-12-05 DIAGNOSIS — I5022 Chronic systolic (congestive) heart failure: Secondary | ICD-10-CM | POA: Diagnosis present

## 2017-12-05 DIAGNOSIS — R4182 Altered mental status, unspecified: Secondary | ICD-10-CM | POA: Diagnosis present

## 2017-12-05 DIAGNOSIS — J9601 Acute respiratory failure with hypoxia: Secondary | ICD-10-CM | POA: Diagnosis present

## 2017-12-05 DIAGNOSIS — Z86711 Personal history of pulmonary embolism: Secondary | ICD-10-CM | POA: Diagnosis not present

## 2017-12-05 DIAGNOSIS — E872 Acidosis: Secondary | ICD-10-CM | POA: Diagnosis present

## 2017-12-05 DIAGNOSIS — I339 Acute and subacute endocarditis, unspecified: Secondary | ICD-10-CM | POA: Diagnosis not present

## 2017-12-05 DIAGNOSIS — G9341 Metabolic encephalopathy: Secondary | ICD-10-CM | POA: Diagnosis present

## 2017-12-05 DIAGNOSIS — R6521 Severe sepsis with septic shock: Secondary | ICD-10-CM

## 2017-12-05 DIAGNOSIS — T826XXA Infection and inflammatory reaction due to cardiac valve prosthesis, initial encounter: Secondary | ICD-10-CM | POA: Diagnosis present

## 2017-12-05 DIAGNOSIS — F1510 Other stimulant abuse, uncomplicated: Secondary | ICD-10-CM | POA: Diagnosis present

## 2017-12-05 DIAGNOSIS — Z515 Encounter for palliative care: Secondary | ICD-10-CM | POA: Diagnosis present

## 2017-12-05 DIAGNOSIS — Z880 Allergy status to penicillin: Secondary | ICD-10-CM

## 2017-12-05 DIAGNOSIS — R748 Abnormal levels of other serum enzymes: Secondary | ICD-10-CM | POA: Diagnosis not present

## 2017-12-05 DIAGNOSIS — I76 Septic arterial embolism: Secondary | ICD-10-CM | POA: Diagnosis present

## 2017-12-05 DIAGNOSIS — I110 Hypertensive heart disease with heart failure: Secondary | ICD-10-CM | POA: Diagnosis present

## 2017-12-05 DIAGNOSIS — Y831 Surgical operation with implant of artificial internal device as the cause of abnormal reaction of the patient, or of later complication, without mention of misadventure at the time of the procedure: Secondary | ICD-10-CM | POA: Diagnosis present

## 2017-12-05 DIAGNOSIS — F1910 Other psychoactive substance abuse, uncomplicated: Secondary | ICD-10-CM | POA: Diagnosis present

## 2017-12-05 DIAGNOSIS — B9562 Methicillin resistant Staphylococcus aureus infection as the cause of diseases classified elsewhere: Secondary | ICD-10-CM | POA: Diagnosis not present

## 2017-12-05 DIAGNOSIS — I351 Nonrheumatic aortic (valve) insufficiency: Secondary | ICD-10-CM

## 2017-12-05 DIAGNOSIS — J9600 Acute respiratory failure, unspecified whether with hypoxia or hypercapnia: Secondary | ICD-10-CM

## 2017-12-05 DIAGNOSIS — I330 Acute and subacute infective endocarditis: Secondary | ICD-10-CM | POA: Diagnosis present

## 2017-12-05 DIAGNOSIS — F17220 Nicotine dependence, chewing tobacco, uncomplicated: Secondary | ICD-10-CM | POA: Diagnosis not present

## 2017-12-05 DIAGNOSIS — I428 Other cardiomyopathies: Secondary | ICD-10-CM | POA: Diagnosis present

## 2017-12-05 DIAGNOSIS — I6340 Cerebral infarction due to embolism of unspecified cerebral artery: Secondary | ICD-10-CM | POA: Diagnosis present

## 2017-12-05 DIAGNOSIS — Z8674 Personal history of sudden cardiac arrest: Secondary | ICD-10-CM

## 2017-12-05 DIAGNOSIS — I341 Nonrheumatic mitral (valve) prolapse: Secondary | ICD-10-CM | POA: Diagnosis not present

## 2017-12-05 DIAGNOSIS — N179 Acute kidney failure, unspecified: Secondary | ICD-10-CM | POA: Diagnosis present

## 2017-12-05 DIAGNOSIS — R7989 Other specified abnormal findings of blood chemistry: Secondary | ICD-10-CM

## 2017-12-05 DIAGNOSIS — A419 Sepsis, unspecified organism: Secondary | ICD-10-CM

## 2017-12-05 DIAGNOSIS — Z953 Presence of xenogenic heart valve: Secondary | ICD-10-CM | POA: Diagnosis not present

## 2017-12-05 DIAGNOSIS — Z7901 Long term (current) use of anticoagulants: Secondary | ICD-10-CM

## 2017-12-05 DIAGNOSIS — E785 Hyperlipidemia, unspecified: Secondary | ICD-10-CM | POA: Diagnosis present

## 2017-12-05 DIAGNOSIS — F1110 Opioid abuse, uncomplicated: Secondary | ICD-10-CM | POA: Diagnosis present

## 2017-12-05 DIAGNOSIS — I38 Endocarditis, valve unspecified: Secondary | ICD-10-CM | POA: Diagnosis present

## 2017-12-05 DIAGNOSIS — R778 Other specified abnormalities of plasma proteins: Secondary | ICD-10-CM

## 2017-12-05 LAB — I-STAT ARTERIAL BLOOD GAS, ED
ACID-BASE DEFICIT: 3 mmol/L — AB (ref 0.0–2.0)
ACID-BASE DEFICIT: 9 mmol/L — AB (ref 0.0–2.0)
Bicarbonate: 17.2 mmol/L — ABNORMAL LOW (ref 20.0–28.0)
Bicarbonate: 13.1 mmol/L — ABNORMAL LOW (ref 20.0–28.0)
O2 SAT: 98 %
O2 Saturation: 100 %
PCO2 ART: 21.6 mmHg — AB (ref 32.0–48.0)
PH ART: 7.402 (ref 7.350–7.450)
PO2 ART: 192 mmHg — AB (ref 83.0–108.0)
Patient temperature: 103.2
TCO2: 18 mmol/L — ABNORMAL LOW (ref 22–32)
TCO2: 14 mmol/L — ABNORMAL LOW (ref 22–32)
pCO2 arterial: 21.6 mmHg — ABNORMAL LOW (ref 32.0–48.0)
pH, Arterial: 7.516 — ABNORMAL HIGH (ref 7.350–7.450)
pO2, Arterial: 107 mmHg (ref 83.0–108.0)

## 2017-12-05 LAB — URINALYSIS, ROUTINE W REFLEX MICROSCOPIC
Bilirubin Urine: NEGATIVE
Glucose, UA: NEGATIVE mg/dL
KETONES UR: NEGATIVE mg/dL
Leukocytes, UA: NEGATIVE
Nitrite: NEGATIVE
PH: 5 (ref 5.0–8.0)
PROTEIN: 100 mg/dL — AB
Specific Gravity, Urine: 1.019 (ref 1.005–1.030)

## 2017-12-05 LAB — CBC WITH DIFFERENTIAL/PLATELET
BAND NEUTROPHILS: 2 %
BASOS ABS: 0 10*3/uL (ref 0.0–0.1)
BASOS PCT: 0 %
BLASTS: 0 %
EOS ABS: 0 10*3/uL (ref 0.0–0.7)
EOS PCT: 0 %
HCT: 37.1 % — ABNORMAL LOW (ref 39.0–52.0)
Hemoglobin: 12.9 g/dL — ABNORMAL LOW (ref 13.0–17.0)
LYMPHS ABS: 2.8 10*3/uL (ref 0.7–4.0)
LYMPHS PCT: 10 %
MCH: 25.4 pg — ABNORMAL LOW (ref 26.0–34.0)
MCHC: 34.8 g/dL (ref 30.0–36.0)
MCV: 73.2 fL — AB (ref 78.0–100.0)
MONO ABS: 0.8 10*3/uL (ref 0.1–1.0)
MONOS PCT: 3 %
Metamyelocytes Relative: 0 %
Myelocytes: 0 %
NEUTROS ABS: 24.3 10*3/uL — AB (ref 1.7–7.7)
Neutrophils Relative %: 85 %
PLATELETS: 37 10*3/uL — AB (ref 150–400)
Promyelocytes Absolute: 0 %
RBC: 5.07 MIL/uL (ref 4.22–5.81)
RDW: 15.6 % — AB (ref 11.5–15.5)
WBC: 27.9 10*3/uL — ABNORMAL HIGH (ref 4.0–10.5)
nRBC: 0 /100 WBC

## 2017-12-05 LAB — COMPREHENSIVE METABOLIC PANEL
ALK PHOS: 133 U/L — AB (ref 38–126)
ALT: 52 U/L (ref 17–63)
AST: 134 U/L — AB (ref 15–41)
Albumin: 2.9 g/dL — ABNORMAL LOW (ref 3.5–5.0)
Anion gap: 14 (ref 5–15)
BUN: 43 mg/dL — AB (ref 6–20)
CHLORIDE: 96 mmol/L — AB (ref 101–111)
CO2: 19 mmol/L — AB (ref 22–32)
CREATININE: 2 mg/dL — AB (ref 0.61–1.24)
Calcium: 8.6 mg/dL — ABNORMAL LOW (ref 8.9–10.3)
GFR calc Af Amer: 50 mL/min — ABNORMAL LOW (ref >60)
GFR calc non Af Amer: 43 mL/min — ABNORMAL LOW (ref >60)
Glucose, Bld: 110 mg/dL — ABNORMAL HIGH (ref 65–99)
Potassium: 4.3 mmol/L (ref 3.5–5.1)
SODIUM: 129 mmol/L — AB (ref 135–145)
Total Bilirubin: 4.5 mg/dL — ABNORMAL HIGH (ref 0.3–1.2)
Total Protein: 8.2 g/dL — ABNORMAL HIGH (ref 6.5–8.1)

## 2017-12-05 LAB — I-STAT TROPONIN, ED: TROPONIN I, POC: 11.3 ng/mL — AB (ref 0.00–0.08)

## 2017-12-05 LAB — PROTIME-INR
INR: 5.56
Prothrombin Time: 50 seconds — ABNORMAL HIGH (ref 11.4–15.2)

## 2017-12-05 LAB — I-STAT CG4 LACTIC ACID, ED
Lactic Acid, Venous: 4.88 mmol/L (ref 0.5–1.9)
Lactic Acid, Venous: 5.19 mmol/L (ref 0.5–1.9)

## 2017-12-05 LAB — LACTIC ACID, PLASMA: Lactic Acid, Venous: 4.7 mmol/L (ref 0.5–1.9)

## 2017-12-05 LAB — PROCALCITONIN: Procalcitonin: 57.08 ng/mL

## 2017-12-05 LAB — TROPONIN I: Troponin I: 9.06 ng/mL (ref <0.03)

## 2017-12-05 MED ORDER — ASPIRIN 300 MG RE SUPP
300.0000 mg | Freq: Once | RECTAL | Status: AC
  Administered 2017-12-05: 300 mg via RECTAL
  Filled 2017-12-05: qty 1

## 2017-12-05 MED ORDER — SODIUM CHLORIDE 0.9 % IV SOLN
650.0000 mg | INTRAVENOUS | Status: DC
  Administered 2017-12-05 – 2017-12-06 (×2): 650 mg via INTRAVENOUS
  Filled 2017-12-05 (×3): qty 13

## 2017-12-05 MED ORDER — SODIUM CHLORIDE 0.9 % IV SOLN
INTRAVENOUS | Status: DC
  Administered 2017-12-06: via INTRAVENOUS

## 2017-12-05 MED ORDER — HEPARIN SODIUM (PORCINE) 5000 UNIT/ML IJ SOLN: 5000.0000 [IU] | Freq: Three times a day (TID) | INTRAMUSCULAR | Status: DC

## 2017-12-05 MED ORDER — SODIUM CHLORIDE 0.9 % IV SOLN
250.0000 mL | INTRAVENOUS | Status: DC | PRN
Start: 2017-12-05 — End: 2017-12-10
  Administered 2017-12-07: 250 mL via INTRAVENOUS

## 2017-12-05 MED ORDER — ACETAMINOPHEN 650 MG RE SUPP
650.0000 mg | Freq: Once | RECTAL | Status: AC
  Administered 2017-12-05: 650 mg via RECTAL
  Filled 2017-12-05: qty 1

## 2017-12-05 MED ORDER — FENTANYL 2500MCG IN NS 250ML (10MCG/ML) PREMIX INFUSION
0.0000 ug/h | INTRAVENOUS | Status: DC
  Administered 2017-12-05 – 2017-12-06 (×2): 25 ug/h via INTRAVENOUS
  Administered 2017-12-06: 100 ug/h via INTRAVENOUS
  Administered 2017-12-07: 200 ug/h via INTRAVENOUS
  Administered 2017-12-07: 100 ug/h via INTRAVENOUS
  Administered 2017-12-08 (×2): 200 ug/h via INTRAVENOUS
  Administered 2017-12-09: 250 ug/h via INTRAVENOUS
  Filled 2017-12-05 (×7): qty 250

## 2017-12-05 MED ORDER — MIDAZOLAM HCL 2 MG/2ML IJ SOLN
2.0000 mg | INTRAMUSCULAR | Status: DC | PRN
  Administered 2017-12-06 – 2017-12-08 (×2): 2 mg via INTRAVENOUS
  Filled 2017-12-05 (×2): qty 2

## 2017-12-05 MED ORDER — FENTANYL CITRATE (PF) 100 MCG/2ML IJ SOLN
INTRAMUSCULAR | Status: AC
  Filled 2017-12-05: qty 2

## 2017-12-05 MED ORDER — SODIUM CHLORIDE 0.9 % IV BOLUS (SEPSIS)
1000.0000 mL | Freq: Once | INTRAVENOUS | Status: AC
  Administered 2017-12-05: 1000 mL via INTRAVENOUS

## 2017-12-05 MED ORDER — MIDAZOLAM HCL 2 MG/2ML IJ SOLN
2.0000 mg | INTRAMUSCULAR | Status: DC | PRN
  Administered 2017-12-08 – 2017-12-09 (×4): 2 mg via INTRAVENOUS
  Filled 2017-12-05 (×4): qty 2

## 2017-12-05 MED ORDER — SODIUM CHLORIDE 0.9 % IV BOLUS (SEPSIS)
2000.0000 mL | Freq: Once | INTRAVENOUS | Status: AC
  Administered 2017-12-05: 2000 mL via INTRAVENOUS

## 2017-12-05 MED ORDER — PANTOPRAZOLE SODIUM 40 MG IV SOLR
40.0000 mg | Freq: Every day | INTRAVENOUS | Status: DC
  Administered 2017-12-06 (×2): 40 mg via INTRAVENOUS
  Filled 2017-12-05: qty 40

## 2017-12-05 MED ORDER — DEXTROSE 5 % IV SOLN
2.0000 g | Freq: Once | INTRAVENOUS | Status: AC
  Administered 2017-12-05: 2 g via INTRAVENOUS
  Filled 2017-12-05: qty 2

## 2017-12-05 MED ORDER — PHENYLEPHRINE 40 MCG/ML (10ML) SYRINGE FOR IV PUSH (FOR BLOOD PRESSURE SUPPORT)
PREFILLED_SYRINGE | INTRAVENOUS | Status: AC
  Filled 2017-12-05: qty 10

## 2017-12-05 NOTE — ED Notes (Signed)
Did ekg on patient shown to Dr Jodi Mourning

## 2017-12-05 NOTE — ED Notes (Signed)
RN and EDP notified of lactic acid results.

## 2017-12-05 NOTE — ED Notes (Signed)
Per nurse MD will put in central line and draw labs.

## 2017-12-05 NOTE — ED Provider Notes (Signed)
[facility] EMERGENCY DEPARTMENT Provider Note   CSN: [id number] Arrival date & time: 11/19/2017  1657     History   Chief Complaint Chief Complaint  Patient presents with  . Altered Mental Status  . possible sepsis    HPI Norman Reed is a 31 y.o. male.  The history is provided by the EMS personnel and medical records.  Altered Mental Status   This is a new problem. Episode onset: this afternoon. The problem has not changed since onset.Associated symptoms include confusion. Risk factors include illicit drug use. Past medical history comments: IVDU, endocarditis, CHF.    Past Medical History:  Diagnosis Date  . Anemia   . Anxiety   . Chronic systolic dysfunction of right ventricle 10/2015   Following large R sided TV Vegetation related PE  . Daily headache   . Depression    Restarted Welbutrin  . Hepatitis C    "not treated for it yet" (08/12/2017)  . 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  Marland Kitchen History of cardiac arrest ~ 01/2016   "in ER; 20 minutes after I was given MSO4; I'm not allergic to MSO4; might have gotten a little too much"  . 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 IV drug abuse (HCC)    Hoping to start In-patient counseling  . Myocardial infarction (HCC)    "X 2 a couple months ago; I ddidn't even know about them" (08/12/2017)  . 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  . 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    Patient Active Problem List   Diagnosis Date Noted  . Endocarditis 12/07/2017  . Valvular cardiomyopathy (HCC) 09/18/2017  . Hepatitis C antibody test positive 09/17/2017    . Depression 09/17/2017  . Acute bacterial endocarditis   . Tobacco abuse   . Chronic pain syndrome   . Opioid use disorder (HCC) 08/13/2017  . Chronic systolic congestive heart failure (HCC) 07/27/2017  . Anemia 07/27/2017  . Hyponatremia 07/27/2017  . Sepsis (HCC) 07/27/2017  . S/P TVR (tricuspid valve replacement) 07/27/2017  . History of tricuspid valve replacement 07/27/2017  . Thrombocytopenia (HCC) 07/27/2017  . Substance abuse (HCC) 07/27/2017  . Prosthetic valve endocarditis (HCC)   . 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 Paroxysmal atrial fibrillation (HCC) 04/07/2016  . Shortness of breath 01/21/2016  . Pleural effusion on right 11/24/2015  . Postoperative anemia due to acute blood loss 11/13/2015  . MRSA nasal colonization 11/12/2015  . Pain, unspecified 11/11/2015  . History of embolectomy 11/10/2015  . Acute septic pulmonary embolism (HCC) 11/08/2015  . Anxiety 11/08/2015  . IVDU (intravenous drug user) 11/08/2015  . Narcotic abuse, episodic (HCC) 11/08/2015  . History of bacteremia 11/07/2015  . History of bacterial endocarditis 11/07/2015  . Peripheral edema 11/07/2015  . Pneumonia of right lower lobe due to infectious organism (HCC) 11/07/2015  . Volume depletion 11/07/2015  . Bacteremia 06/19/2015  . Right upper quadrant abdominal pain 05/24/2015  . Viral hepatitis C without hepatic coma 05/22/2015    Past Surgical History:  Procedure Laterality Date  .  CARDIAC CATHETERIZATION  11/2015   [facility]: Pre-op MVR/TVR - no significant CAD (report not available)  . CARDIAC VALVE REPLACEMENT  11/2015   [facility]: Combined Mitral & Tricuspic Valve - Mechanical Prosthesis; Pulmonary Throbolectomy (R side).  . CARDIOVERSION  11/2015   Afib associated with MV & TV Endocarditis & mycotic pulmonary embolism (R lung)  . KNEE ARTHROSCOPY Left ~ 2009  . TEE WITHOUT CARDIOVERSION N/A 07/30/2017   Procedure: TRANSESOPHAGEAL  ECHOCARDIOGRAM (TEE);  Surgeon: Dolores Patty, MD: EF 40-45% with diffuse hypokinesis. Moderate aortic regurgitation - with no vegetation noted. Small mechanical mitral valve vegetation (6x14 mm) noted. No tricuspid vegetation noted. RV remains dilated with moderate severe dysfunction. Both atria are enlarged. Worsening aortic insufficiency.  . TRANSTHORACIC ECHOCARDIOGRAM  10/2015   [facility]: EF 55-60%. Dilated RV with evidence of pressure and volume overload. Severe tricuspid regurgitation due to tricuspid valve vegetations. Dilated RV. Medium-sized pericardial fusion  . TRANSTHORACIC ECHOCARDIOGRAM  01/2016   [facility]) Post Cardiac Arrest: EF ~25-30%. Inferior Akinesis & globak Hypokinesis       Home Medications    Prior to Admission medications   Medication Sig Start Date End Date Taking? Authorizing Provider  acetaminophen (TYLENOL) 325 MG tablet Take 650 mg by mouth every 6 (six) hours as needed for mild pain or fever.    [provider]  Buprenorphine HCl-Naloxone HCl (SUBOXONE) 8-2 MG FILM Place 2 Film under the tongue daily. 10/06/17   [name], MD  buPROPion (WELLBUTRIN SR) 150 MG 12 hr tablet Take 150 mg by mouth See admin instructions. Take 1 tablet (150MG ) by mouth daily for 1 week; then 1 tablet (150 mg) by mouth twice daily.    [provider]  buPROPion (WELLBUTRIN) 75 MG tablet Take 1 tablet (75 mg total) by mouth 3 (three) times daily. 09/29/17 09/29/18  [name], MD  carvedilol (COREG) 3.125 MG tablet Take 1 tablet (3.125 mg total) by mouth 2 (two) times daily with a meal. Patient not taking: Reported on 09/18/2017 09/08/17   Rodolph Bong, MD  gabapentin (NEURONTIN) 600 MG tablet Take 0.5 tablets (300 mg total) by mouth 2 (two) times daily. 09/08/17   Rodolph Bong, MD  Multiple Vitamins-Minerals (MULTIVITAMIN WITH MINERALS) tablet Take 1 tablet by mouth daily.    [provider]  nicotine (NICODERM CQ - DOSED IN MG/24  HOURS) 21 mg/24hr patch Place 1 patch (21 mg total) onto the skin daily. 09/09/17   Rodolph Bong, MD  warfarin (COUMADIN) 10 MG tablet Take 10 mg by mouth daily.     [provider]  warfarin (COUMADIN) 2.5 MG tablet Take 2.5 mg by mouth daily.    [provider]  warfarin (COUMADIN) 7.5 MG tablet Take 2 tablets (15 mg total) by mouth one time only at 6 PM. 09/08/17   Rodolph Bong, MD  zolpidem (AMBIEN) 10 MG tablet Take 1 tablet (10 mg total) by mouth at bedtime as needed for sleep. 09/08/17   Rodolph Bong, MD    Family History Family History  Problem Relation Age of Onset  . Hyperlipidemia Maternal Grandmother   . Hyperlipidemia Paternal Grandfather     Social History Social History   Tobacco Use  . Smoking status: Current Every Day Smoker    Packs/day: 0.25    Years: 13.00    Pack years: 3.25    Types: Cigarettes  . Smokeless tobacco: Current User    Types: Chew  Substance Use Topics  .  Alcohol use: No  . Drug use: Yes    Types: Methamphetamines, Marijuana, Other-see comments    Comment: 08/12/2017 "clean since 07/21/2017; no opiates for 5 yr"     Allergies   Penicillins and Vancomycin   Review of Systems Review of Systems  Unable to perform ROS: Mental status change  Psychiatric/Behavioral: Positive for confusion.     Physical Exam Updated Vital Signs BP 128/66   Pulse (!) 138   Temp (!) 103.2 F (39.6 C) (Rectal)   Resp (!) 0   Ht 5\' 11"  (1.803 m)   Wt 80.3 kg (177 lb)   SpO2 100%   BMI 24.69 kg/m   Physical Exam  Constitutional: He appears well-developed and well-nourished.  HENT:  Head: Normocephalic and atraumatic.  Eyes: Pupils are equal, round, and reactive to light.  B/l scleral injection  Neck: Neck supple.  Cardiovascular: Regular rhythm.  No murmur heard. Tachycardic  Pulmonary/Chest: Breath sounds normal. No respiratory distress.  Tachypneic, 4L [place] on arrvial  Abdominal: Soft. There is no tenderness.    Musculoskeletal: He exhibits no edema.  Neurological: He is alert.  Moving all 4ext, not answering questions coherently  Skin: Skin is warm and dry.  [place] lesions on palms, mid diffuse petechiae  Psychiatric:  Unable to assess  Nursing note and vitals reviewed.    ED Treatments / Results  Labs (all labs ordered are listed, but only abnormal results are displayed) Labs Reviewed  COMPREHENSIVE METABOLIC PANEL - Abnormal; Notable for the following components:      Result Value   Sodium 129 (*)    Chloride 96 (*)    CO2 19 (*)    Glucose, Bld 110 (*)    BUN 43 (*)    Creatinine, Ser 2.00 (*)    Calcium 8.6 (*)    Total Protein 8.2 (*)    Albumin 2.9 (*)    AST 134 (*)    Alkaline Phosphatase 133 (*)    Total Bilirubin 4.5 (*)    GFR calc non Af Amer 43 (*)    GFR calc Af Amer 50 (*)    All other components within normal limits  CBC WITH DIFFERENTIAL/PLATELET - Abnormal; Notable for the following components:   WBC 27.9 (*)    Hemoglobin 12.9 (*)    HCT 37.1 (*)    MCV 73.2 (*)    MCH 25.4 (*)    RDW 15.6 (*)    Platelets 37 (*)    Neutro Abs 24.3 (*)    All other components within normal limits  PROTIME-INR - Abnormal; Notable for the following components:   Prothrombin Time 50.0 (*)    INR 5.56 (*)    All other components within normal limits  URINALYSIS, ROUTINE W REFLEX MICROSCOPIC - Abnormal; Notable for the following components:   Color, Urine AMBER (*)    APPearance CLOUDY (*)    Hgb urine dipstick LARGE (*)    Protein, ur 100 (*)    Bacteria, UA RARE (*)    Squamous Epithelial / LPF 0-5 (*)    All other components within normal limits  LACTIC ACID, PLASMA - Abnormal; Notable for the following components:   Lactic Acid, Venous 4.7 (*)    All other components within normal limits  I-STAT CG4 LACTIC ACID, ED - Abnormal; Notable for the following components:   Lactic Acid, Venous 4.88 (*)    All other components within normal limits  I-STAT ARTERIAL  BLOOD GAS, ED - Abnormal; Notable  for the following components:   pH, Arterial 7.516 (*)    pCO2 arterial 21.6 (*)    pO2, Arterial 192.0 (*)    Bicarbonate 17.2 (*)    TCO2 18 (*)    Acid-base deficit 3.0 (*)    All other components within normal limits  I-STAT TROPONIN, ED - Abnormal; Notable for the following components:   Troponin i, poc 11.30 (*)    All other components within normal limits  I-STAT CG4 LACTIC ACID, ED - Abnormal; Notable for the following components:   Lactic Acid, Venous 5.19 (*)    All other components within normal limits  I-STAT ARTERIAL BLOOD GAS, ED - Abnormal; Notable for the following components:   pCO2 arterial 21.6 (*)    Bicarbonate 13.1 (*)    TCO2 14 (*)    Acid-base deficit 9.0 (*)    All other components within normal limits  CULTURE, BLOOD (ROUTINE X 2)  CULTURE, BLOOD (ROUTINE X 2)  CULTURE, BLOOD (SINGLE)  URINE CULTURE  PROCALCITONIN  LACTIC ACID, PLASMA  TROPONIN I  TROPONIN I  TROPONIN I  PROCALCITONIN  RAPID URINE DRUG SCREEN, HOSP PERFORMED  BLOOD GAS, ARTERIAL  BASIC METABOLIC PANEL  CBC  MAGNESIUM  PHOSPHORUS    EKG  EKG Interpretation None       Radiology Ct Head Wo Contrast  Result Date: 12/06/2017 CLINICAL DATA:  31 year old male with altered mental status. EXAM: CT HEAD WITHOUT CONTRAST TECHNIQUE: Contiguous axial images were obtained from the base of the skull through the vertex without intravenous contrast. COMPARISON:  Head CT dated 07/28/2017 FINDINGS: Brain: There is a 1 x 2 cm low attenuating area in the left cerebellum. A similar low attenuating area is noted in the right temporal lobe posteriorly as well as a focal low attenuating area in the gray-white matter junction of the right occipital lobe. Findings most consistent with areas of infarct, acute or subacute. Further evaluation with MRI is recommended. The ventricles and sulci appropriate size for patient's age. There is no acute intracranial hemorrhage.  No midline shift. No extra-axial fluid collection. Vascular: No hyperdense vessel or unexpected calcification. Skull: Normal. Negative for fracture or focal lesion. Sinuses/Orbits: Extensive amount of venous gas in the facial musculature with involvement of the left masticator musculature, orbits, and air within the cavernous sinus. Other: None IMPRESSION: 1. No acute intracranial hemorrhage. 2. Multiple low attenuating foci bilaterally most consistent with areas of infarct, subacute or acute. The pattern of infarcts in various vascular distribution suggest a central thromboembolic etiology. Further evaluation with MRI is recommended. Chest CT may provide additional evaluation of the central vasculature. 3. Air within the cavernous sinus as well as within the veins of the face and orbits of indeterminate etiology but may be related to IV access/injection. These results were called by telephone at the time of interpretation on 11/28/2017 at 8:06 pm to Dr. Blane OharaJOSHUA ZAVITZ , who verbally acknowledged these results. Electronically Signed   By: Elgie CollardArash  Radparvar M.D.   On: 11/20/2017 20:08   Dg Chest Portable 1 View  Result Date: 11/22/2017 CLINICAL DATA:  Initial evaluation for central line placement. EXAM: PORTABLE CHEST 1 VIEW COMPARISON:  Of prior radiograph from earlier same day. FINDINGS: Interval placement of right IJ approach central venous catheter with tip overlying the distal SVC. Patient now intubated with the tip of an endotracheal tube positioned 3.5 cm above the carina. Enteric tube courses in in the abdomen. Stable cardiomegaly with prosthetic valve. Mediastinal silhouette normal. Lungs mildly  hypoinflated. Increased pulmonary vascular congestion and interstitial prominence as compared to previous, suggesting mildly progressive edema. Patchy right basilar opacity may reflect superimposed atelectasis or infiltrate. Small right pleural effusion. No pneumothorax. No acute osseus abnormality. IMPRESSION: 1.  Right IJ approach centra venous catheter in place with tip overlying the distal SVC. 2. Tip of the endotracheal tube positioned 3.5 cm above the carina. 3. Interval worsening in mild diffuse pulmonary interstitial edema with small right pleural effusion. Superimposed right basilar opacity may reflect atelectasis or infiltrate. Electronically Signed   By: Rise Mu M.D.   On: 2017/12/27 23:23   Dg Chest Portable 1 View  Result Date: 12/27/2017 CLINICAL DATA:  Altered mental status and difficulty breathing. EXAM: PORTABLE CHEST 1 VIEW COMPARISON:  08/12/2017 and chest CT 07/30/2017 FINDINGS: Sternotomy wires and prosthetic heart valve unchanged. Lungs are adequately inflated with chronic interstitial changes in the right base. Slightly more confluent opacification over these interstitial changes of the right base as a superimposed early infectious process is possible. No evidence of effusion. Mild stable cardiomegaly. Remainder of the exam is unchanged. IMPRESSION: Chronic interstitial changes right base with slightly more confluent opacification in the right base as early superimposed infection is possible. Mild stable cardiomegaly. Electronically Signed   By: Elberta Fortis M.D.   On: 27-Dec-2017 17:50    Procedures Procedures (including critical care time)  Medications Ordered in ED Medications  DAPTOmycin (CUBICIN) 650 mg in sodium chloride 0.9 % IVPB (0 mg Intravenous Stopped Dec 27, 2017 2029)  phenylephrine 0.4-0.9 MG/10ML-% injection (not administered)  fentaNYL (SUBLIMAZE) 100 MCG/2ML injection (not administered)  fentaNYL in NS (23mcg/ml) infusion-PREMIX (25 mcg/hr Intravenous New Bag/Given Dec 27, 2017 2225)  0.9 %  sodium chloride infusion (not administered)  pantoprazole (PROTONIX) injection 40 mg (not administered)  midazolam (VERSED) injection 2 mg (not administered)  midazolam (VERSED) injection 2 mg (not administered)  0.9 %  sodium chloride infusion (not  administered)  sodium chloride 0.9 % bolus 2,000 mL (0 mLs Intravenous Stopped December 27, 2017 1740)  acetaminophen (TYLENOL) suppository 650 mg (650 mg Rectal Given 12-27-2017 1752)  sodium chloride 0.9 % bolus 1,000 mL (0 mLs Intravenous Stopped 12-27-2017 1859)  cefTRIAXone (ROCEPHIN) 2 g in dextrose 5 % 50 mL IVPB (0 g Intravenous Stopped December 27, 2017 1927)  aspirin suppository 300 mg (300 mg Rectal Given 12/27/17 2002)     Initial Impression / Assessment and Plan / ED Course  I have reviewed the triage vital signs and the nursing notes.  Pertinent labs & imaging results that were available during my care of the patient were reviewed by me and considered in my medical decision making (see chart for details).     Pt with h/o MSSA endocarditis, chronic systolic heart failure, IV drug abuse, MV & TV replacement presents with fever and AMS. EMS initially called out for breathing difficulty; found the Pt's SaO2 to be in the low 90s, but also noted that he was altered and febrile to 100.84F. They placed him on 4L [place] for transport. The Pt reportedly lives with his parents, but is staying with friends b/c the parents are out of town for the holidays.  VS & exam as above. CODE SEPSIS initiated on Pt arrival. EKG: ST @ 147bpm w/minimal elevation in V5-6; consider endocarditis/myocarditis more likely than STEMI in this Pt. CXR w/chronic interstitial changes w/slightly increased right basilar opacification. Labs remarkable for WBC 27.9 (w/toxic granulation), Crt 2.00, LA 4.88.  ID consulted for antibiotic recommendations given h/o MSSA endocarditis and penicillin allergy; recommending  empiric daptomycin and ceftriaxone.  Troponin resulted at 11.30; rectal ASA given. Repeat EKG: ST @ 139 w/minimal elevation in II, III, aVF, V5-6. Cardiology consulted by phone; recommendations pending.  CT head w/multiple acute/subacute infarctions & air in the cavernous sinus & veins of the face.  Intensivist consulted and  evaluated the Pt in the ED; will admit the Pt to the ICU for further evaluation and treatment.   Final Clinical Impressions(s) / ED Diagnoses   Final diagnoses:  Elevated troponin  Sepsis, due to unspecified organism [facility])    ED Discharge Orders    None       Forest Becker, MD Dec 31, 2017 1610    Blane Ohara, MD 12/06/17 [phone]

## 2017-12-05 NOTE — ED Notes (Signed)
attempted to call report

## 2017-12-05 NOTE — H&P (Signed)
PULMONARY / CRITICAL CARE MEDICINE   Name: Norman Reed MRN: [id number] DOB: 11-21-1986    ADMISSION DATE:  12/09/2017 CONSULTATION DATE:  11/27/2017  REFERRING MD:  Dr. Rejeana Brock   CHIEF COMPLAINT: AMS   HISTORY OF PRESENT ILLNESS:   31 year old male with PMH of IV Drug Abuse, MV and TV replacement in 2016 at [facility] to ED on 12/22 with fever, AMS, and hypoxia. HR 147, WBC 27.9, Tmax 103.2, Troponin 9.06, LA 4.7, INR 5.56. CXR with chronic interstitial changes with slightly increased right basilar opacification. CT Head with multiple low attenuating foci. Cardiology Consulted. PCCM asked to admit.   Patient was intubated for hypoxia and lethargy and admitted to the ICU. Neurology and CT surgery consulted.   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  PAST SURGICAL HISTORY: He  has a past surgical history that includes TEE without cardioversion (N/A, 07/30/2017); Cardiac valve replacement (11/2015); Knee arthroscopy (Left, ~ 2009); transthoracic echocardiogram (10/2015); transthoracic echocardiogram (01/2016); Cardiac catheterization (11/2015); and Cardioversion (11/2015).  Allergies  Allergen Reactions  . Penicillins Other (See Comments)    Childhood reaction  . Vancomycin     Other reaction(s): Other (See Comments) Patient with generalized pruritic erythematous flat indistinct macules, worst on his neck, after completion of vancomycin 1.38 milligrams over 70 minutes. Patient given Benadryl. Suspect "red man" syndrome, consider close  observation with next dose.     No current facility-administered medications on file prior to encounter.    Current Outpatient Medications on File Prior to Encounter  Medication Sig  . acetaminophen (TYLENOL) 325 MG tablet Take 650 mg by mouth every 6 (six) hours as needed for mild pain or fever.  . Buprenorphine HCl-Naloxone HCl (SUBOXONE) 8-2 MG FILM Place 2 Film under the tongue daily.  Marland Kitchen buPROPion (WELLBUTRIN SR) 150 MG 12 hr tablet Take 150 mg by mouth See admin instructions. Take 1 tablet (150MG ) by mouth daily for 1 week; then 1 tablet (150 mg) by mouth twice daily.  Marland Kitchen buPROPion (WELLBUTRIN) 75 MG tablet Take 1 tablet (75 mg total) by mouth 3 (three) times daily.  . carvedilol (COREG) 3.125 MG tablet Take 1 tablet (3.125 mg total) by mouth 2 (two) times daily with a meal. (Patient not taking: Reported on 09/18/2017)  . gabapentin (NEURONTIN) 600 MG tablet Take 0.5 tablets (300 mg total) by mouth 2 (two) times daily.  . Multiple Vitamins-Minerals (MULTIVITAMIN WITH MINERALS) tablet Take 1 tablet by mouth daily.  . nicotine (NICODERM CQ - DOSED IN MG/24 HOURS) 21 mg/24hr patch Place 1 patch (21 mg total) onto the skin daily.  Marland Kitchen warfarin (COUMADIN) 10 MG tablet Take 10 mg by mouth daily.   Marland Kitchen warfarin (COUMADIN) 2.5 MG tablet Take 2.5 mg by mouth daily.  Marland Kitchen warfarin (COUMADIN) 7.5 MG tablet Take 2 tablets (15 mg total) by mouth one time only at 6 PM.  . zolpidem (AMBIEN) 10 MG tablet Take 1 tablet (10 mg total) by mouth at bedtime as needed for sleep.    FAMILY HISTORY:  His indicated that his mother is alive. He indicated that his father is alive. He indicated that the  status of his maternal grandmother is unknown. He indicated that the status of his paternal grandfather is unknown.   SOCIAL HISTORY: He  reports that he has been smoking cigarettes.  He has a 3.25 pack-year smoking history. His smokeless tobacco use includes chew. He reports that he uses drugs. Drugs: Methamphetamines,  Marijuana, and Other-see comments. He reports that he does not drink alcohol.  REVIEW OF SYSTEMS:   Unable to review as patient is encephalopathic   SUBJECTIVE:   VITAL SIGNS: BP 128/66   Pulse (!) 138   Temp (!) 103.2 F (39.6 C) (Rectal)   Resp (!) 0   Ht 5\' 11"  (1.803 m)   Wt 80.3 kg (177 lb)   SpO2 100%   BMI 24.69 kg/m   HEMODYNAMICS:    VENTILATOR SETTINGS: Vent Mode: PRVC FiO2 (%):  [100 %] 100 % Set Rate:  [16 bmp] 16 bmp Vt Set:  [600 mL] 600 mL PEEP:  [5 cmH20] 5 cmH20 Plateau Pressure:  [27 cmH20] 27 cmH20  INTAKE / OUTPUT: I/O last 3 completed shifts: In: 3150 [I.V.:150; IV Piggyback:3000] Out: -   PHYSICAL EXAMINATION: General:  Young adult male in resp distress Neuro:  Lethargic, confused, does not follow commands HEENT:  Dry blood noted to mouth  Cardiovascular:  Tachy, no MRG  Lungs:  Clear breath sounds, no wheeze/crackles  Abdomen:  Non-distended, active bowel sounds  Musculoskeletal:  -edema, petechiae noted to lower extremities   Skin:  Mottled   LABS:  BMET Recent Labs  Lab 04-10-2017 1710  NA 129*  K 4.3  CL 96*  CO2 19*  BUN 43*  CREATININE 2.00*  GLUCOSE 110*    Electrolytes Recent Labs  Lab 04-10-2017 1710  CALCIUM 8.6*    CBC Recent Labs  Lab 04-10-2017 1710  WBC 27.9*  HGB 12.9*  HCT 37.1*  PLT 37*    Coag's Recent Labs  Lab 04-10-2017 1710  INR 5.56*    Sepsis Markers Recent Labs  Lab 04-10-2017 1722 04-10-2017 1848  LATICACIDVEN 4.88* 5.19*    ABG Recent Labs  Lab 04-10-2017 1732 04-10-2017 2132  PHART 7.516* 7.402  PCO2ART 21.6* 21.6*  PO2ART 192.0* 107.0    Liver Enzymes Recent Labs  Lab 04-10-2017 1710  AST 134*  ALT 52  ALKPHOS 133*  BILITOT 4.5*  ALBUMIN 2.9*    Cardiac Enzymes No results for input(s): TROPONINI, PROBNP in the last 168 hours.  Glucose No results for input(s): GLUCAP in the last 168 hours.  Imaging Ct Head Wo Contrast  Result Date: 05/21/17 CLINICAL DATA:   31 year old male with altered mental status. EXAM: CT HEAD WITHOUT CONTRAST TECHNIQUE: Contiguous axial images were obtained from the base of the skull through the vertex without intravenous contrast. COMPARISON:  Head CT dated 07/28/2017 FINDINGS: Brain: There is a 1 x 2 cm low attenuating area in the left cerebellum. A similar low attenuating area is noted in the right temporal lobe posteriorly as well as a focal low attenuating area in the gray-white matter junction of the right occipital lobe. Findings most consistent with areas of infarct, acute or subacute. Further evaluation with MRI is recommended. The ventricles and sulci appropriate size for patient's age. There is no acute intracranial hemorrhage. No midline shift. No extra-axial fluid collection. Vascular: No hyperdense vessel or unexpected calcification. Skull: Normal. Negative for fracture or focal lesion. Sinuses/Orbits: Extensive amount of venous gas in the facial musculature with involvement of the left masticator musculature, orbits, and air within the cavernous  sinus. Other: None IMPRESSION: 1. No acute intracranial hemorrhage. 2. Multiple low attenuating foci bilaterally most consistent with areas of infarct, subacute or acute. The pattern of infarcts in various vascular distribution suggest a central thromboembolic etiology. Further evaluation with MRI is recommended. Chest CT may provide additional evaluation of the central vasculature. 3. Air within the cavernous sinus as well as within the veins of the face and orbits of indeterminate etiology but may be related to IV access/injection. These results were called by telephone at the time of interpretation on 12/04/2017 at 8:06 pm to Dr. Blane Ohara , who verbally acknowledged these results. Electronically Signed   By: Elgie Collard M.D.   On: 11/27/2017 20:08   Dg Chest Portable 1 View  Result Date: 11/29/2017 CLINICAL DATA:  Altered mental status and difficulty breathing. EXAM:  PORTABLE CHEST 1 VIEW COMPARISON:  08/12/2017 and chest CT 07/30/2017 FINDINGS: Sternotomy wires and prosthetic heart valve unchanged. Lungs are adequately inflated with chronic interstitial changes in the right base. Slightly more confluent opacification over these interstitial changes of the right base as a superimposed early infectious process is possible. No evidence of effusion. Mild stable cardiomegaly. Remainder of the exam is unchanged. IMPRESSION: Chronic interstitial changes right base with slightly more confluent opacification in the right base as early superimposed infection is possible. Mild stable cardiomegaly. Electronically Signed   By: Elberta Fortis M.D.   On: 12/11/2017 17:50     STUDIES:  CXR 12/22 > Chronic interstitial changes right base with slightly more confluent opacification in the right base as early superimposed infection is possible.Mild stable cardiomegaly CT Head 12/22 > 1. No acute intracranial hemorrhage. 2. Multiple low attenuating foci bilaterally most consistent with areas of infarct, subacute or acute. The pattern of infarcts in various vascular distribution suggest a central thromboembolic etiology. Further evaluation with MRI is recommended. Chest CT may provide additional evaluation of the central vasculature. 3. Air within the cavernous sinus as well as within the veins of the face and orbits of indeterminate etiology but may be related to IV access/injection. ECHO 12/23 >>  CULTURES: Blood 12/23 >> Urine 12/23 >> Sputum 12/23 >>  ANTIBIOTICS: Rocephin 12/23 >> Daptomycin 12/23 >>   SIGNIFICANT EVENTS: 12/22 > Presents to ED   LINES/TUBES: ETT 12/23 >>   DISCUSSION: 31 year old male with h/o of IVDA and MSSA endocarditis s/p MVR and TVR at [facility] in 2016. Presents with AMS, CT Head with low attenuating foci bilaterally. ECHO with large vegetation of prosthetic mitral valve and mitral stenosis with EF 20%. Intubated.   ASSESSMENT /  PLAN:  PULMONARY A: Acute Hypoxic Respiratory Failure CXR with chronic interstitial changes right base more confluent opacification  P:   Vent Support VAP Bundle  Trend CXR and ABG  CARDIOVASCULAR A:  Septic Shock  Endocarditis s/p MV and TV Bioprostetic replacement in 2016 Chronic Systolic Heart Failure (EF 45-50, aortic valve has some calcification and is mildly leaky)  Troponin 11.30 P:  Cardiac Monitoring  Cardiology and CT surgery consulted  Trend Troponin  Trend CVP  Maintain MAP > 65  Hold home coumadin/Anticogulation per neurology as below   RENAL A:   Acute Kidney Injury  Crt 2 >  Anion Gap Metabolic Acidosis  LA 5.19 >   P:   Trend BMP  NS @ 125 ml/hr  Replace electrolytes as indicated  Trend LA   GASTROINTESTINAL A:   SUP  P:   NPO PPI  HEMATOLOGIC A:   Supratherapeutic INR  P:  Trend CBC  Trend INR  Vitamin K for reversal   INFECTIOUS A:   Septic Shock  H/O MSSA Endocarditis PCT 57.08 >> P:   ID consulted > Recommend Daptomycin and Ceftriaxone  Trend WBC and Fever Curve Trend PCT and LA Follow Culture Data   ENDOCRINE A:   No issues    P:   Trend Glucose   NEUROLOGIC A:   Metabolic Encephalopathy  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 Attempted to call numbers listed on Facesheet for mother and brother.   - Inter-disciplinary family meet or Palliative Care meeting due by: 12/30      CC Time: 52 minutes  Jovita Kussmaul, AGACNP-BC [facility]  Pgr: [phone]  PCCM Pgr: [phone]

## 2017-12-05 NOTE — Progress Notes (Signed)
  Echocardiogram 2D Echocardiogram has been performed.  Janalyn Harder 01/03/2018, 10:31 PM

## 2017-12-05 NOTE — Procedures (Signed)
Central Venous Catheter Insertion Procedure Note Dontrail Slivinski [id number] 10-26-1986  Procedure: Insertion of Central Venous Catheter Indications: Assessment of intravascular volume, Drug and/or fluid administration and Frequent blood sampling  Procedure Details Consent: Unable to obtain consent because of emergent medical necessity. Time Out: Verified patient identification, verified procedure, site/side was marked, verified correct patient position, special equipment/implants available, medications/allergies/relevent history reviewed, required imaging and test results available.  Performed  Maximum sterile technique was used including antiseptics, cap, gloves, gown, hand hygiene, mask and sheet. Skin prep: Chlorhexidine; local anesthetic administered A antimicrobial bonded/coated triple lumen catheter was placed in the right internal jugular vein using the Seldinger technique.  Evaluation Blood flow good Complications: No apparent complications Patient did tolerate procedure well. Chest X-ray ordered to verify placement.  CXR: pending.  Jovita Kussmaul, AGACNP-BC [facility]  Pgr: [phone]  PCCM Pgr: [phone]

## 2017-12-05 NOTE — ED Notes (Signed)
Provider made aware that patient is tachypnic and has petechial rash with profuse discoloration to body. No orders received

## 2017-12-05 NOTE — ED Triage Notes (Signed)
[facility] from [facility] for AMS and difficulty breathing; pt 02 sat found to be in low 90s on arrival and put on NRB then titrated down to 4L nasal canula; pt friends report hx of heart valve replacement and drug abuse; black mucous membranes in mouth; pt speaking incoherently; warm to touch;

## 2017-12-05 NOTE — Procedures (Signed)
Intubation Procedure Note Norman Reed [id number] [birth year]  Procedure: Intubation Indications: Respiratory insufficiency  Procedure Details Consent: Unable to obtain consent because of emergent medical necessity. Time Out: Verified patient identification, verified procedure, site/side was marked, verified correct patient position, special equipment/implants available, medications/allergies/relevent history reviewed, required imaging and test results available.  Performed  Maximum sterile technique was used including gloves, hand hygiene and mask.  3    Evaluation Hemodynamic Status: BP stable throughout; O2 sats: stable throughout Patient's Current Condition: stable Complications: No apparent complications Patient did tolerate procedure well. Chest X-ray ordered to verify placement.  CXR: pending.   Jovita Kussmaul, AGACNP-BC [place] [facility]  Pgr: [phone]  PCCM Pgr: [phone]

## 2017-12-05 NOTE — ED Notes (Signed)
QNS for blood test,  Pt moving arm.  Will recollect once pt is intubated.

## 2017-12-06 DIAGNOSIS — B9562 Methicillin resistant Staphylococcus aureus infection as the cause of diseases classified elsewhere: Secondary | ICD-10-CM

## 2017-12-06 DIAGNOSIS — Z86711 Personal history of pulmonary embolism: Secondary | ICD-10-CM

## 2017-12-06 DIAGNOSIS — I341 Nonrheumatic mitral (valve) prolapse: Secondary | ICD-10-CM

## 2017-12-06 DIAGNOSIS — I6349 Cerebral infarction due to embolism of other cerebral artery: Secondary | ICD-10-CM

## 2017-12-06 DIAGNOSIS — I2690 Septic pulmonary embolism without acute cor pulmonale: Secondary | ICD-10-CM

## 2017-12-06 DIAGNOSIS — Z953 Presence of xenogenic heart valve: Secondary | ICD-10-CM

## 2017-12-06 DIAGNOSIS — N179 Acute kidney failure, unspecified: Secondary | ICD-10-CM

## 2017-12-06 DIAGNOSIS — F17220 Nicotine dependence, chewing tobacco, uncomplicated: Secondary | ICD-10-CM

## 2017-12-06 DIAGNOSIS — Z9911 Dependence on respirator [ventilator] status: Secondary | ICD-10-CM

## 2017-12-06 DIAGNOSIS — R748 Abnormal levels of other serum enzymes: Secondary | ICD-10-CM

## 2017-12-06 DIAGNOSIS — F17210 Nicotine dependence, cigarettes, uncomplicated: Secondary | ICD-10-CM

## 2017-12-06 DIAGNOSIS — I330 Acute and subacute infective endocarditis: Secondary | ICD-10-CM

## 2017-12-06 DIAGNOSIS — I339 Acute and subacute endocarditis, unspecified: Secondary | ICD-10-CM

## 2017-12-06 DIAGNOSIS — Z7901 Long term (current) use of anticoagulants: Secondary | ICD-10-CM

## 2017-12-06 DIAGNOSIS — F1910 Other psychoactive substance abuse, uncomplicated: Secondary | ICD-10-CM

## 2017-12-06 LAB — GLUCOSE, CAPILLARY
GLUCOSE-CAPILLARY: 102 mg/dL — AB (ref 65–99)
GLUCOSE-CAPILLARY: 158 mg/dL — AB (ref 65–99)
GLUCOSE-CAPILLARY: 62 mg/dL — AB (ref 65–99)
GLUCOSE-CAPILLARY: 76 mg/dL (ref 65–99)
GLUCOSE-CAPILLARY: 96 mg/dL (ref 65–99)
Glucose-Capillary: 108 mg/dL — ABNORMAL HIGH (ref 65–99)
Glucose-Capillary: 121 mg/dL — ABNORMAL HIGH (ref 65–99)
Glucose-Capillary: 69 mg/dL (ref 65–99)

## 2017-12-06 LAB — MAGNESIUM: Magnesium: 2.3 mg/dL (ref 1.7–2.4)

## 2017-12-06 LAB — CBC
HEMATOCRIT: 35.7 % — AB (ref 39.0–52.0)
Hemoglobin: 12.1 g/dL — ABNORMAL LOW (ref 13.0–17.0)
MCH: 25.4 pg — AB (ref 26.0–34.0)
MCHC: 33.9 g/dL (ref 30.0–36.0)
MCV: 75 fL — AB (ref 78.0–100.0)
Platelets: 27 10*3/uL — CL (ref 150–400)
RBC: 4.76 MIL/uL (ref 4.22–5.81)
RDW: 15.8 % — AB (ref 11.5–15.5)
WBC: 33.1 10*3/uL — AB (ref 4.0–10.5)

## 2017-12-06 LAB — POCT I-STAT 3, ART BLOOD GAS (G3+)
ACID-BASE DEFICIT: 13 mmol/L — AB (ref 0.0–2.0)
ACID-BASE DEFICIT: 14 mmol/L — AB (ref 0.0–2.0)
BICARBONATE: 16.7 mmol/L — AB (ref 20.0–28.0)
Bicarbonate: 12 mmol/L — ABNORMAL LOW (ref 20.0–28.0)
O2 SAT: 100 %
O2 SAT: 99 %
PCO2 ART: 28.2 mmHg — AB (ref 32.0–48.0)
PH ART: 7.24 — AB (ref 7.350–7.450)
TCO2: 13 mmol/L — AB (ref 22–32)
TCO2: 18 mmol/L — ABNORMAL LOW (ref 22–32)
pCO2 arterial: 56.6 mmHg — ABNORMAL HIGH (ref 32.0–48.0)
pH, Arterial: 7.093 — CL (ref 7.350–7.450)
pO2, Arterial: 188 mmHg — ABNORMAL HIGH (ref 83.0–108.0)
pO2, Arterial: 503 mmHg — ABNORMAL HIGH (ref 83.0–108.0)

## 2017-12-06 LAB — ECHOCARDIOGRAM COMPLETE
HEIGHTINCHES: 69 in
WEIGHTICAEL: 2832 [oz_av]

## 2017-12-06 LAB — BLOOD CULTURE ID PANEL (REFLEXED)
ACINETOBACTER BAUMANNII: NOT DETECTED
CANDIDA ALBICANS: NOT DETECTED
CANDIDA GLABRATA: NOT DETECTED
CANDIDA KRUSEI: NOT DETECTED
CANDIDA PARAPSILOSIS: NOT DETECTED
Candida tropicalis: NOT DETECTED
ENTEROBACTER CLOACAE COMPLEX: NOT DETECTED
ENTEROCOCCUS SPECIES: NOT DETECTED
ESCHERICHIA COLI: NOT DETECTED
Enterobacteriaceae species: NOT DETECTED
Haemophilus influenzae: NOT DETECTED
KLEBSIELLA OXYTOCA: NOT DETECTED
Klebsiella pneumoniae: NOT DETECTED
LISTERIA MONOCYTOGENES: NOT DETECTED
Methicillin resistance: DETECTED — AB
Neisseria meningitidis: NOT DETECTED
PSEUDOMONAS AERUGINOSA: NOT DETECTED
Proteus species: NOT DETECTED
SERRATIA MARCESCENS: NOT DETECTED
STAPHYLOCOCCUS AUREUS BCID: DETECTED — AB
STREPTOCOCCUS AGALACTIAE: NOT DETECTED
STREPTOCOCCUS PNEUMONIAE: NOT DETECTED
STREPTOCOCCUS PYOGENES: NOT DETECTED
Staphylococcus species: DETECTED — AB
Streptococcus species: NOT DETECTED

## 2017-12-06 LAB — PROTIME-INR
INR: 6.54
Prothrombin Time: 56.8 seconds — ABNORMAL HIGH (ref 11.4–15.2)

## 2017-12-06 LAB — TROPONIN I
TROPONIN I: 10.6 ng/mL — AB (ref <0.03)
Troponin I: 10.33 ng/mL (ref <0.03)
Troponin I: 9.56 ng/mL (ref <0.03)

## 2017-12-06 LAB — RAPID URINE DRUG SCREEN, HOSP PERFORMED
AMPHETAMINES: POSITIVE — AB
BARBITURATES: NOT DETECTED
BENZODIAZEPINES: NOT DETECTED
Cocaine: NOT DETECTED
Opiates: NOT DETECTED
TETRAHYDROCANNABINOL: NOT DETECTED

## 2017-12-06 LAB — BASIC METABOLIC PANEL
ANION GAP: 17 — AB (ref 5–15)
BUN: 63 mg/dL — AB (ref 6–20)
CALCIUM: 7.5 mg/dL — AB (ref 8.9–10.3)
CO2: 13 mmol/L — AB (ref 22–32)
Chloride: 105 mmol/L (ref 101–111)
Creatinine, Ser: 2.96 mg/dL — ABNORMAL HIGH (ref 0.61–1.24)
GFR calc Af Amer: 31 mL/min — ABNORMAL LOW (ref >60)
GFR calc non Af Amer: 27 mL/min — ABNORMAL LOW (ref >60)
GLUCOSE: 102 mg/dL — AB (ref 65–99)
Potassium: 4.3 mmol/L (ref 3.5–5.1)
Sodium: 135 mmol/L (ref 135–145)

## 2017-12-06 LAB — ABO/RH: ABO/RH(D): O POS

## 2017-12-06 LAB — LACTIC ACID, PLASMA
LACTIC ACID, VENOUS: 7.5 mmol/L — AB (ref 0.5–1.9)
Lactic Acid, Venous: 6.5 mmol/L (ref 0.5–1.9)

## 2017-12-06 LAB — MRSA PCR SCREENING: MRSA by PCR: NEGATIVE

## 2017-12-06 LAB — PROCALCITONIN: Procalcitonin: 95.18 ng/mL

## 2017-12-06 LAB — PHOSPHORUS: Phosphorus: 8 mg/dL — ABNORMAL HIGH (ref 2.5–4.6)

## 2017-12-06 MED ORDER — SODIUM CHLORIDE 0.9 % IV SOLN
0.0000 ug/min | INTRAVENOUS | Status: DC
  Filled 2017-12-06: qty 1

## 2017-12-06 MED ORDER — ACETAMINOPHEN 160 MG/5ML PO SOLN
650.0000 mg | Freq: Four times a day (QID) | ORAL | Status: DC | PRN
  Administered 2017-12-06 – 2017-12-09 (×2): 650 mg via ORAL
  Filled 2017-12-06 (×2): qty 20.3

## 2017-12-06 MED ORDER — VASOPRESSIN 20 UNIT/ML IV SOLN
0.0300 [IU]/min | INTRAVENOUS | Status: DC
  Filled 2017-12-06: qty 2

## 2017-12-06 MED ORDER — DEXTROSE 5 % IV SOLN: 2.0000 g | INTRAVENOUS | Status: DC

## 2017-12-06 MED ORDER — SODIUM CHLORIDE 0.9 % IV BOLUS (SEPSIS)
250.0000 mL | Freq: Once | INTRAVENOUS | Status: AC
  Administered 2017-12-06: 250 mL via INTRAVENOUS

## 2017-12-06 MED ORDER — SODIUM CHLORIDE 0.9 % IV BOLUS (SEPSIS)
250.0000 mL | Freq: Once | INTRAVENOUS | Status: DC
  Administered 2017-12-06: 250 mL via INTRAVENOUS

## 2017-12-06 MED ORDER — NOREPINEPHRINE BITARTRATE 1 MG/ML IV SOLN
0.0000 ug/min | INTRAVENOUS | Status: DC
  Administered 2017-12-06: 8 ug/min via INTRAVENOUS
  Administered 2017-12-06: 4 ug/min via INTRAVENOUS
  Administered 2017-12-07: 20 ug/min via INTRAVENOUS
  Administered 2017-12-07: 17 ug/min via INTRAVENOUS
  Administered 2017-12-07: 20 ug/min via INTRAVENOUS
  Administered 2017-12-07: 16 ug/min via INTRAVENOUS
  Filled 2017-12-06 (×11): qty 4

## 2017-12-06 MED ORDER — DEXTROSE 10 % IV SOLN
INTRAVENOUS | Status: DC
  Administered 2017-12-06: 05:00:00 via INTRAVENOUS

## 2017-12-06 MED ORDER — SODIUM CHLORIDE 0.9 % IV SOLN: Freq: Once | INTRAVENOUS | Status: DC

## 2017-12-06 MED ORDER — CHLORHEXIDINE GLUCONATE 0.12% ORAL RINSE (MEDLINE KIT)
15.0000 mL | Freq: Two times a day (BID) | OROMUCOSAL | Status: DC
  Administered 2017-12-06 – 2017-12-09 (×7): 15 mL via OROMUCOSAL

## 2017-12-06 MED ORDER — SODIUM BICARBONATE 8.4 % IV SOLN
INTRAVENOUS | Status: DC
  Administered 2017-12-06 – 2017-12-07 (×4): via INTRAVENOUS
  Filled 2017-12-06 (×5): qty 150

## 2017-12-06 MED ORDER — SODIUM CHLORIDE 0.9 % IV SOLN
300.0000 mg | Freq: Three times a day (TID) | INTRAVENOUS | Status: DC
  Administered 2017-12-06 – 2017-12-09 (×9): 300 mg via INTRAVENOUS
  Filled 2017-12-06 (×11): qty 300

## 2017-12-06 MED ORDER — ORAL CARE MOUTH RINSE
15.0000 mL | Freq: Four times a day (QID) | OROMUCOSAL | Status: DC
  Administered 2017-12-06 – 2017-12-09 (×13): 15 mL via OROMUCOSAL

## 2017-12-06 MED ORDER — VITAMIN K1 10 MG/ML IJ SOLN
5.0000 mg | Freq: Once | INTRAVENOUS | Status: AC
  Administered 2017-12-06: 5 mg via INTRAVENOUS
  Filled 2017-12-06: qty 0.5

## 2017-12-06 NOTE — Progress Notes (Signed)
This RN is unable to contact the patients family at this time after multiple attempts dialing the numbers provided in chart. Pt is currently intubated and sedated and unable to to sign consent form for FFP transfusion.

## 2017-12-06 NOTE — Consult Note (Signed)
Reason for Consult:prosthetic mitral endocarditis Referring Physician: CCM  Norman Reed is an 31 y.o. male.  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 No other history available.  Intubated in ED for altered mental status/ sepsis.  Echo (I reviewed personally)showed a large vegetation of prosthetic mitral valve with some mitral stenosis but interestingly not a lot of regurgitation. Mild AI. EF ~ 20%. Official reading not yet available.  Past Medical History:  Diagnosis Date  . Anemia   . Anxiety   . Chronic systolic dysfunction of right ventricle 10/2015   Following large R sided TV Vegetation related PE  . Daily headache   . Depression    Restarted Welbutrin  . Hepatitis C    "not treated for it yet" (08/12/2017)  . 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  Marland Kitchen History of cardiac arrest ~ 01/2016   "in ER; 20 minutes after I was given MSO4; I'm not allergic to MSO4; might have gotten a little too much"  . 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 IV drug abuse ([place])    Hoping to start In-patient counseling  . Myocardial infarction ([place])    "X 2 a couple months ago; I ddidn't even know about them" (08/12/2017)  . 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  . 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    Past Surgical History:  Procedure Laterality Date  . CARDIAC CATHETERIZATION  11/2015   [facility]: Pre-op MVR/TVR - no significant CAD (report not available)  .  CARDIAC VALVE REPLACEMENT  11/2015   [facility]: Combined Mitral & Tricuspic Valve - Mechanical Prosthesis; Pulmonary Throbolectomy (R side).  . CARDIOVERSION  11/2015   Afib associated with MV & TV Endocarditis & mycotic pulmonary embolism (R lung)  . KNEE ARTHROSCOPY Left ~ 2009  . TEE WITHOUT CARDIOVERSION N/A 07/30/2017   Procedure: TRANSESOPHAGEAL ECHOCARDIOGRAM (TEE);  Surgeon: Jolaine Artist, MD: EF 40-45% with diffuse hypokinesis. Moderate aortic regurgitation - with no vegetation noted. Small mechanical mitral valve vegetation (6x14 mm) noted. No tricuspid vegetation noted. RV remains dilated with moderate severe dysfunction. Both atria are enlarged. Worsening aortic insufficiency.  . TRANSTHORACIC ECHOCARDIOGRAM  10/2015   [facility]: EF 55-60%. Dilated RV with evidence of pressure and volume overload. Severe tricuspid regurgitation due to tricuspid valve vegetations. Dilated RV. Medium-sized pericardial fusion  . TRANSTHORACIC ECHOCARDIOGRAM  01/2016   [facility]) Post Cardiac Arrest: EF ~25-30%. Inferior Akinesis & globak Hypokinesis    Family History  Problem Relation Age of Onset  . Hyperlipidemia Maternal Grandmother   . Hyperlipidemia Paternal Grandfather     Social History:  reports that he has been smoking cigarettes.  He has a 3.25 pack-year smoking history. His smokeless tobacco use includes chew. He reports that he uses drugs. Drugs: Methamphetamines, Marijuana, and Other-see comments. He reports that he does not drink alcohol.  Allergies:  Allergies  Allergen Reactions  . Penicillins Other (See Comments)  Childhood reaction  . Vancomycin     Other reaction(s): Other (See Comments) Patient with generalized pruritic erythematous flat indistinct macules, worst on his neck, after completion of vancomycin 1.38 milligrams over 70 minutes. Patient given Benadryl. 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     Medications:  Scheduled: .  fentaNYL      . pantoprazole (PROTONIX) IV  40 mg Intravenous QHS  . phenylephrine        Results for orders placed or performed during the hospital encounter of 11/30/2017 (from the past 48 hour(s))  Comprehensive metabolic panel     Status: Abnormal   Collection Time: 12/03/2017  5:10 PM  Result Value Ref Range   Sodium 129 (L) 135 - 145 mmol/L   Potassium 4.3 3.5 - 5.1 mmol/L   Chloride 96 (L) 101 - 111 mmol/L   CO2 19 (L) 22 - 32 mmol/L   Glucose, Bld 110 (H) 65 - 99 mg/dL   BUN 43 (H) 6 - 20 mg/dL   Creatinine, Ser 2.00 (H) 0.61 - 1.24 mg/dL   Calcium 8.6 (L) 8.9 - 10.3 mg/dL   Total Protein 8.2 (H) 6.5 - 8.1 g/dL   Albumin 2.9 (L) 3.5 - 5.0 g/dL   AST 134 (H) 15 - 41 U/L   ALT 52 17 - 63 U/L   Alkaline Phosphatase 133 (H) 38 - 126 U/L   Total Bilirubin 4.5 (H) 0.3 - 1.2 mg/dL   GFR calc non Af Amer 43 (L) >60 mL/min   GFR calc Af Amer 50 (L) >60 mL/min    Comment: (NOTE) The eGFR has been calculated using the CKD EPI equation. This calculation has not been validated in all clinical situations. eGFR's persistently <60 mL/min signify possible Chronic Kidney Disease.    Anion gap 14 5 - 15  CBC with Differential     Status: Abnormal   Collection Time: 11/17/2017  5:10 PM  Result Value Ref Range   WBC 27.9 (H) 4.0 - 10.5 K/uL    Comment: REPEATED TO VERIFY   RBC 5.07 4.22 - 5.81 MIL/uL   Hemoglobin 12.9 (L) 13.0 - 17.0 g/dL   HCT 37.1 (L) 39.0 - 52.0 %   MCV 73.2 (L) 78.0 - 100.0 fL   MCH 25.4 (L) 26.0 - 34.0 pg   MCHC 34.8 30.0 - 36.0 g/dL   RDW 15.6 (H) 11.5 - 15.5 %   Platelets 37 (L) 150 - 400 K/uL    Comment: SPECIMEN CHECKED FOR CLOTS REPEATED TO VERIFY PLATELET COUNT CONFIRMED BY SMEAR    Neutrophils Relative % 85 %   Lymphocytes Relative 10 %   Monocytes Relative 3 %   Eosinophils Relative 0 %   Basophils Relative 0 %   Band Neutrophils 2 %   Metamyelocytes Relative 0 %   Myelocytes 0 %   Promyelocytes Absolute 0 %   Blasts 0 %   nRBC 0 0 /100 WBC    Neutro Abs 24.3 (H) 1.7 - 7.7 K/uL   Lymphs Abs 2.8 0.7 - 4.0 K/uL   Monocytes Absolute 0.8 0.1 - 1.0 K/uL   Eosinophils Absolute 0.0 0.0 - 0.7 K/uL   Basophils Absolute 0.0 0.0 - 0.1 K/uL   WBC Morphology TOXIC GRANULATION     Comment: HYPERSEGMENTED NEUT  Protime-INR     Status: Abnormal   Collection Time: 11/18/2017  5:10 PM  Result Value Ref Range   Prothrombin Time 50.0 (H) 11.4 - 15.2 seconds  INR 5.56 (HH)     Comment: REPEATED TO VERIFY CRITICAL RESULT CALLED TO, READ BACK BY AND VERIFIED WITH: RN A DENNIS AT 1928 [id number] MARTINB   I-Stat CG4 Lactic Acid, ED     Status: Abnormal   Collection Time: 12/11/2017  5:22 PM  Result Value Ref Range   Lactic Acid, Venous 4.88 (HH) 0.5 - 1.9 mmol/L   Comment NOTIFIED PHYSICIAN   I-Stat arterial blood gas, ED     Status: Abnormal   Collection Time: 12/02/2017  5:32 PM  Result Value Ref Range   pH, Arterial 7.516 (H) 7.350 - 7.450   pCO2 arterial 21.6 (L) 32.0 - 48.0 mmHg   pO2, Arterial 192.0 (H) 83.0 - 108.0 mmHg   Bicarbonate 17.2 (L) 20.0 - 28.0 mmol/L   TCO2 18 (L) 22 - 32 mmol/L   O2 Saturation 100.0 %   Acid-base deficit 3.0 (H) 0.0 - 2.0 mmol/L   Patient temperature 101.9 F    Collection site RADIAL, ALLEN'S TEST ACCEPTABLE    Drawn by RT    Sample type ARTERIAL    Comment NOTIFIED PHYSICIAN   Urinalysis, Routine w reflex microscopic     Status: Abnormal   Collection Time: 11/30/2017  5:50 PM  Result Value Ref Range   Color, Urine AMBER (A) YELLOW    Comment: BIOCHEMICALS MAY BE AFFECTED BY COLOR   APPearance CLOUDY (A) CLEAR   Specific Gravity, Urine 1.019 1.005 - 1.030   pH 5.0 5.0 - 8.0   Glucose, UA NEGATIVE NEGATIVE mg/dL   Hgb urine dipstick LARGE (A) NEGATIVE   Bilirubin Urine NEGATIVE NEGATIVE   Ketones, ur NEGATIVE NEGATIVE mg/dL   Protein, ur 100 (A) NEGATIVE mg/dL   Nitrite NEGATIVE NEGATIVE   Leukocytes, UA NEGATIVE NEGATIVE   RBC / HPF TOO NUMEROUS TO COUNT 0 - 5 RBC/hpf   WBC, UA 6-30 0 - 5 WBC/hpf    Bacteria, UA RARE (A) NONE SEEN   Squamous Epithelial / LPF 0-5 (A) NONE SEEN   WBC Clumps PRESENT    Mucus PRESENT   I-Stat Troponin, ED (not at [facility])     Status: Abnormal   Collection Time: 11/18/2017  6:46 PM  Result Value Ref Range   Troponin i, poc 11.30 (HH) 0.00 - 0.08 ng/mL   Comment NOTIFIED PHYSICIAN    Comment 3            Comment: Due to the release kinetics of cTnI, a negative result within the first hours of the onset of symptoms does not rule out myocardial infarction with certainty. If myocardial infarction is still suspected, repeat the test at appropriate intervals.   I-Stat CG4 Lactic Acid, ED     Status: Abnormal   Collection Time: 11/14/2017  6:48 PM  Result Value Ref Range   Lactic Acid, Venous 5.19 (HH) 0.5 - 1.9 mmol/L   Comment NOTIFIED PHYSICIAN   Lactic acid, plasma     Status: Abnormal   Collection Time: 12/06/2017  9:07 PM  Result Value Ref Range   Lactic Acid, Venous 4.7 (HH) 0.5 - 1.9 mmol/L    Comment: CRITICAL RESULT CALLED TO, READ BACK BY AND VERIFIED WITH: CABLE M,RN 11/30/2017 [id number] WAYK   Troponin I (q 6hr x 3)     Status: Abnormal   Collection Time: 11/29/2017  9:07 PM  Result Value Ref Range   Troponin I 9.06 (HH) <0.03 ng/mL    Comment: CRITICAL RESULT CALLED TO, READ BACK BY AND VERIFIED WITH: [employer]  J,RN 12/03/2017 2341 WAYK   Procalcitonin - Baseline     Status: None   Collection Time: 12/04/2017  9:07 PM  Result Value Ref Range   Procalcitonin 57.08 ng/mL    Comment:        Interpretation: PCT >= 10 ng/mL: Important systemic inflammatory response, almost exclusively due to severe bacterial sepsis or septic shock. (NOTE)       Sepsis PCT Algorithm           Lower Respiratory Tract                                      Infection PCT Algorithm    ----------------------------     ----------------------------         PCT < 0.25 ng/mL                PCT < 0.10 ng/mL         Strongly encourage             Strongly discourage   discontinuation  of antibiotics    initiation of antibiotics    ----------------------------     -----------------------------       PCT 0.25 - 0.50 ng/mL            PCT 0.10 - 0.25 ng/mL               OR       >80% decrease in PCT            Discourage initiation of                                            antibiotics      Encourage discontinuation           of antibiotics    ----------------------------     -----------------------------         PCT >= 0.50 ng/mL              PCT 0.26 - 0.50 ng/mL                AND       <80% decrease in PCT             Encourage initiation of                                             antibiotics       Encourage continuation           of antibiotics    ----------------------------     -----------------------------        PCT >= 0.50 ng/mL                  PCT > 0.50 ng/mL               AND         increase in PCT                  Strongly encourage  initiation of antibiotics    Strongly encourage escalation           of antibiotics                                     -----------------------------                                           PCT <= 0.25 ng/mL                                                 OR                                        > 80% decrease in PCT                                     Discontinue / Do not initiate                                             antibiotics   Urine rapid drug screen (hosp performed)     Status: Abnormal   Collection Time: 12/11/2017  9:08 PM  Result Value Ref Range   Opiates NONE DETECTED NONE DETECTED   Cocaine NONE DETECTED NONE DETECTED   Benzodiazepines NONE DETECTED NONE DETECTED   Amphetamines POSITIVE (A) NONE DETECTED   Tetrahydrocannabinol NONE DETECTED NONE DETECTED   Barbiturates NONE DETECTED NONE DETECTED    Comment: (NOTE) DRUG SCREEN FOR MEDICAL PURPOSES ONLY.  IF CONFIRMATION IS NEEDED FOR ANY PURPOSE, NOTIFY LAB WITHIN 5 DAYS. LOWEST DETECTABLE LIMITS FOR  URINE DRUG SCREEN Drug Class                     Cutoff (ng/mL) Amphetamine and metabolites    1000 Barbiturate and metabolites    200 Benzodiazepine                 939 Tricyclics and metabolites     300 Opiates and metabolites        300 Cocaine and metabolites        300 THC                            50   I-Stat arterial blood gas, ED     Status: Abnormal   Collection Time: 11/19/2017  9:32 PM  Result Value Ref Range   pH, Arterial 7.402 7.350 - 7.450   pCO2 arterial 21.6 (L) 32.0 - 48.0 mmHg   pO2, Arterial 107.0 83.0 - 108.0 mmHg   Bicarbonate 13.1 (L) 20.0 - 28.0 mmol/L   TCO2 14 (L) 22 - 32 mmol/L   O2 Saturation 98.0 %   Acid-base deficit 9.0 (H) 0.0 - 2.0 mmol/L   Patient temperature 103.2 F    Collection site RADIAL, ALLEN'S TEST  ACCEPTABLE    Drawn by RT    Sample type ARTERIAL    Comment NOTIFIED PHYSICIAN   I-STAT 3, arterial blood gas (G3+)     Status: Abnormal   Collection Time: 12/06/17 12:11 AM  Result Value Ref Range   pH, Arterial 7.093 (LL) 7.350 - 7.450   pCO2 arterial 56.6 (H) 32.0 - 48.0 mmHg   pO2, Arterial 503.0 (H) 83.0 - 108.0 mmHg   Bicarbonate 16.7 (L) 20.0 - 28.0 mmol/L   TCO2 18 (L) 22 - 32 mmol/L   O2 Saturation 100.0 %   Acid-base deficit 13.0 (H) 0.0 - 2.0 mmol/L   Patient temperature 39.2 C    Collection site RADIAL, ALLEN'S TEST ACCEPTABLE    Sample type ARTERIAL    Comment NOTIFIED PHYSICIAN     Ct Head Wo Contrast  Result Date: 11/24/2017 CLINICAL DATA:  31 year old male with altered mental status. EXAM: CT HEAD WITHOUT CONTRAST TECHNIQUE: Contiguous axial images were obtained from the base of the skull through the vertex without intravenous contrast. COMPARISON:  Head CT dated 07/28/2017 FINDINGS: Brain: There is a 1 x 2 cm low attenuating area in the left cerebellum. A similar low attenuating area is noted in the right temporal lobe posteriorly as well as a focal low attenuating area in the gray-white matter junction of the right  occipital lobe. Findings most consistent with areas of infarct, acute or subacute. Further evaluation with MRI is recommended. The ventricles and sulci appropriate size for patient's age. There is no acute intracranial hemorrhage. No midline shift. No extra-axial fluid collection. Vascular: No hyperdense vessel or unexpected calcification. Skull: Normal. Negative for fracture or focal lesion. Sinuses/Orbits: Extensive amount of venous gas in the facial musculature with involvement of the left masticator musculature, orbits, and air within the cavernous sinus. Other: None IMPRESSION: 1. No acute intracranial hemorrhage. 2. Multiple low attenuating foci bilaterally most consistent with areas of infarct, subacute or acute. The pattern of infarcts in various vascular distribution suggest a central thromboembolic etiology. Further evaluation with MRI is recommended. Chest CT may provide additional evaluation of the central vasculature. 3. Air within the cavernous sinus as well as within the veins of the face and orbits of indeterminate etiology but may be related to IV access/injection. These results were called by telephone at the time of interpretation on 11/23/2017 at 8:06 pm to Dr. Elnora Morrison , who verbally acknowledged these results. Electronically Signed   By: Anner Crete M.D.   On: 11/19/2017 20:08   Dg Chest Portable 1 View  Result Date: 12/09/2017 CLINICAL DATA:  Initial evaluation for central line placement. EXAM: PORTABLE CHEST 1 VIEW COMPARISON:  Of prior radiograph from earlier same day. FINDINGS: Interval placement of right IJ approach central venous catheter with tip overlying the distal SVC. Patient now intubated with the tip of an endotracheal tube positioned 3.5 cm above the carina. Enteric tube courses in in the abdomen. Stable cardiomegaly with prosthetic valve. Mediastinal silhouette normal. Lungs mildly hypoinflated. Increased pulmonary vascular congestion and interstitial prominence as  compared to previous, suggesting mildly progressive edema. Patchy right basilar opacity may reflect superimposed atelectasis or infiltrate. Small right pleural effusion. No pneumothorax. No acute osseus abnormality. IMPRESSION: 1. Right IJ approach centra venous catheter in place with tip overlying the distal SVC. 2. Tip of the endotracheal tube positioned 3.5 cm above the carina. 3. Interval worsening in mild diffuse pulmonary interstitial edema with small right pleural effusion. Superimposed right basilar opacity may reflect atelectasis or infiltrate.  Electronically Signed   By: Jeannine Boga M.D.   On: 12/11/2017 23:23   Dg Chest Portable 1 View  Result Date: 12/03/2017 CLINICAL DATA:  Altered mental status and difficulty breathing. EXAM: PORTABLE CHEST 1 VIEW COMPARISON:  08/12/2017 and chest CT 07/30/2017 FINDINGS: Sternotomy wires and prosthetic heart valve unchanged. Lungs are adequately inflated with chronic interstitial changes in the right base. Slightly more confluent opacification over these interstitial changes of the right base as a superimposed early infectious process is possible. No evidence of effusion. Mild stable cardiomegaly. Remainder of the exam is unchanged. IMPRESSION: Chronic interstitial changes right base with slightly more confluent opacification in the right base as early superimposed infection is possible. Mild stable cardiomegaly. Electronically Signed   By: Marin Olp M.D.   On: 12/04/2017 17:50    Review of Systems  Unable to perform ROS: Intubated  Gastrointestinal: Positive for nausea.   Blood pressure 128/66, pulse (!) 138, temperature (!) 103.2 F (39.6 C), temperature source Rectal, resp. rate (!) 0, height 5' 11"  (1.803 m), weight 177 lb (80.3 kg), SpO2 100 %. Physical Exam  Vitals reviewed. Constitutional: He appears well-developed.  Intubated,  ?sedated  HENT:  Head: Normocephalic and atraumatic.  Neck: No tracheal deviation present.   Cardiovascular:  Murmur (diastolic) heard. Tachy, regular  Respiratory: Breath sounds normal.  GI: Soft. He exhibits no distension.  Neurological:  intubated  Skin:  Feet mottled and warm, hands cool, Janeway lesions, right middle nail bed dark  CT HEAD WITHOUT CONTRAST  TECHNIQUE: Contiguous axial images were obtained from the base of the skull through the vertex without intravenous contrast.  COMPARISON:  Head CT dated 07/28/2017  FINDINGS: Brain: There is a 1 x 2 cm low attenuating area in the left cerebellum. A similar low attenuating area is noted in the right temporal lobe posteriorly as well as a focal low attenuating area in the gray-white matter junction of the right occipital lobe. Findings most consistent with areas of infarct, acute or subacute. Further evaluation with MRI is recommended. The ventricles and sulci appropriate size for patient's age. There is no acute intracranial hemorrhage. No midline shift. No extra-axial fluid collection.  Vascular: No hyperdense vessel or unexpected calcification.  Skull: Normal. Negative for fracture or focal lesion.  Sinuses/Orbits: Extensive amount of venous gas in the facial musculature with involvement of the left masticator musculature, orbits, and air within the cavernous sinus.  Other: None  IMPRESSION: 1. No acute intracranial hemorrhage. 2. Multiple low attenuating foci bilaterally most consistent with areas of infarct, subacute or acute. The pattern of infarcts in various vascular distribution suggest a central thromboembolic etiology. Further evaluation with MRI is recommended. Chest CT may provide additional evaluation of the central vasculature. 3. Air within the cavernous sinus as well as within the veins of the face and orbits of indeterminate etiology but may be related to IV access/injection. These results were called by telephone at the time of interpretation on 11/25/2017 at 8:06 pm to Dr.  Elnora Morrison , who verbally acknowledged these results.   Electronically Signed   By: Anner Crete M.D.   On: 12/04/2017 20:08   Assessment/Plan: 31 yo man with history of IVDA with multiple relapses who presents with recurrent prosthetic mitral valve endocarditis and possible tricuspid valve as well. He presented with altered mental status and has evidence of multiple strokes. Has peripheral emboli as well. He is febrile to 103 and tachycardic to 130. Troponin 11.30. Acute renal failure with creatinine of 2.0  on arrival. Profound metabolic acidosis with a base deficit of 13. Procalcitonin is 57. Tox screen + for amphetamines.  He had an episode of mitral endocarditis with MSSA this past summer and was not felt to be a candidate for redo valve replacement due to ongoing IVDA. Was treated medically.  He is not a candidate for surgery at this time due to acute strokes, acute renal failure, and overwhelming sepsis. He would have essentially no hope for survival.  He needs resuscitation and antibiotics.  Prognosis is poor  Norman Reed 12/06/2017, 12:19 AM

## 2017-12-06 NOTE — Progress Notes (Signed)
PULMONARY / CRITICAL CARE MEDICINE   Name: Norman Reed MRN: [id number] DOB: 11-21-1986    ADMISSION DATE:  12/09/2017 CONSULTATION DATE:  11/27/2017  REFERRING MD:  Dr. Rejeana Brock   CHIEF COMPLAINT: AMS   HISTORY OF PRESENT ILLNESS:   31 year old male with PMH of IV Drug Abuse, MV and TV replacement in 2016 at [facility] to ED on 12/22 with fever, AMS, and hypoxia. HR 147, WBC 27.9, Tmax 103.2, Troponin 9.06, LA 4.7, INR 5.56. CXR with chronic interstitial changes with slightly increased right basilar opacification. CT Head with multiple low attenuating foci. Cardiology Consulted. PCCM asked to admit.   Patient was intubated for hypoxia and lethargy and admitted to the ICU. Neurology and CT surgery consulted.   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  PAST SURGICAL HISTORY: He  has a past surgical history that includes TEE without cardioversion (N/A, 07/30/2017); Cardiac valve replacement (11/2015); Knee arthroscopy (Left, ~ 2009); transthoracic echocardiogram (10/2015); transthoracic echocardiogram (01/2016); Cardiac catheterization (11/2015); and Cardioversion (11/2015).  Allergies  Allergen Reactions  . Penicillins Other (See Comments)    Childhood reaction  . Vancomycin     Other reaction(s): Other (See Comments) Patient with generalized pruritic erythematous flat indistinct macules, worst on his neck, after completion of vancomycin 1.38 milligrams over 70 minutes. Patient given Benadryl. Suspect "red man" syndrome, consider close  observation with next dose.     No current facility-administered medications on file prior to encounter.    Current Outpatient Medications on File Prior to Encounter  Medication Sig  . acetaminophen (TYLENOL) 325 MG tablet Take 650 mg by mouth every 6 (six) hours as needed for mild pain or fever.  . Buprenorphine HCl-Naloxone HCl (SUBOXONE) 8-2 MG FILM Place 2 Film under the tongue daily.  Marland Kitchen buPROPion (WELLBUTRIN SR) 150 MG 12 hr tablet Take 150 mg by mouth See admin instructions. Take 1 tablet (150MG ) by mouth daily for 1 week; then 1 tablet (150 mg) by mouth twice daily.  Marland Kitchen buPROPion (WELLBUTRIN) 75 MG tablet Take 1 tablet (75 mg total) by mouth 3 (three) times daily.  . carvedilol (COREG) 3.125 MG tablet Take 1 tablet (3.125 mg total) by mouth 2 (two) times daily with a meal. (Patient not taking: Reported on 09/18/2017)  . gabapentin (NEURONTIN) 600 MG tablet Take 0.5 tablets (300 mg total) by mouth 2 (two) times daily.  . Multiple Vitamins-Minerals (MULTIVITAMIN WITH MINERALS) tablet Take 1 tablet by mouth daily.  . nicotine (NICODERM CQ - DOSED IN MG/24 HOURS) 21 mg/24hr patch Place 1 patch (21 mg total) onto the skin daily.  Marland Kitchen warfarin (COUMADIN) 10 MG tablet Take 10 mg by mouth daily.   Marland Kitchen warfarin (COUMADIN) 2.5 MG tablet Take 2.5 mg by mouth daily.  Marland Kitchen warfarin (COUMADIN) 7.5 MG tablet Take 2 tablets (15 mg total) by mouth one time only at 6 PM.  . zolpidem (AMBIEN) 10 MG tablet Take 1 tablet (10 mg total) by mouth at bedtime as needed for sleep.    FAMILY HISTORY:  His indicated that his mother is alive. He indicated that his father is alive. He indicated that the  status of his maternal grandmother is unknown. He indicated that the status of his paternal grandfather is unknown.   SOCIAL HISTORY: He  reports that he has been smoking cigarettes.  He has a 3.25 pack-year smoking history. His smokeless tobacco use includes chew. He reports that he uses drugs. Drugs: Methamphetamines,  Marijuana, and Other-see comments. He reports that he does not drink alcohol.  REVIEW OF SYSTEMS:   Unable to review as patient is encephalopathic   SUBJECTIVE:  Critically ill, intubated on Levophed and bicarb drip Blood cultures positive for MRSA.  VITAL SIGNS: BP (!) 99/48   Pulse (!) 112   Temp 98.1 F (36.7 C)   Resp (!) 24   Ht 6' (1.829 m)   Wt 174 lb 2.6 oz (79 kg)   SpO2 100%   BMI 23.62 kg/m   HEMODYNAMICS: CVP:  [0 mmHg-21 mmHg] 17 mmHg  VENTILATOR SETTINGS: Vent Mode: PCV FiO2 (%):  [40 %-100 %] 40 % Set Rate:  [16 bmp-30 bmp] 30 bmp Vt Set:  [20 mL-600 mL] 20 mL PEEP:  [5 cmH20] 5 cmH20 Pressure Support:  [16 cmH20] 16 cmH20 Plateau Pressure:  [25 cmH20-27 cmH20] 25 cmH20  INTAKE / OUTPUT: I/O last 3 completed shifts: In: 4055.9 [I.V.:764.9; Other:8; NG/GT:70; IV Piggyback:3213] Out: 300 [Urine:300]  PHYSICAL EXAMINATION: Gen:      No acute distress, ill apprearing HEENT:  EOMI, sclera anicteric, ETT tube Neck:     No masses; no thyromegaly Lungs:    Clear to auscultation bilaterally; normal respiratory effort CV:         Tachycardic; no murmurs Abd:      + bowel sounds; soft, non-tender; no palpable masses, no distension Ext:    No edema; adequate peripheral perfusion Skin:      Warm and dry; petechia over the skin Neuro: Sedated, unresponsive.  LABS:  BMET Recent Labs  Lab 11/29/2017 1710 12/06/17 0610  NA 129* 135  K 4.3 4.3  CL 96* 105  CO2 19* 13*  BUN 43* 63*  CREATININE 2.00* 2.96*  GLUCOSE 110* 102*    Electrolytes Recent Labs  Lab 12/06/2017 1710 12/06/17 0610  CALCIUM 8.6* 7.5*  MG  --  2.3  PHOS  --  8.0*    CBC Recent Labs  Lab 11/15/2017 1710 12/06/17 0610  WBC 27.9* 33.1*  HGB 12.9* 12.1*  HCT 37.1* 35.7*  PLT 37* 27*    Coag's Recent Labs  Lab 11/27/2017 1710 12/06/17 0610  INR 5.56* 6.54*    Sepsis Markers Recent Labs  Lab 12/07/2017 2107 12/06/17 0146 12/06/17 0446 12/06/17 0447  LATICACIDVEN 4.7*  6.5*  --  7.5*  PROCALCITON 57.08  --  95.18  --     ABG Recent Labs  Lab 12/08/2017 2132 12/06/17 0011 12/06/17 0401  PHART 7.402 7.093* 7.240*  PCO2ART 21.6* 56.6* 28.2*  PO2ART 107.0 503.0* 188.0*    Liver Enzymes Recent Labs  Lab 11/27/2017 1710  AST 134*  ALT 52  ALKPHOS 133*  BILITOT 4.5*  ALBUMIN 2.9*    Cardiac Enzymes Recent Labs  Lab 11/20/2017 2107 12/06/17 0446  TROPONINI 9.06* 10.33*    Glucose Recent Labs  Lab 12/06/17 0037 12/06/17 0351 12/06/17 0630 12/06/17 0716  GLUCAP 69 62* 96 121*    Imaging Ct Head Wo Contrast  Result Date: 11/21/2017 CLINICAL DATA:  31 year old male with altered mental status. EXAM: CT HEAD WITHOUT CONTRAST TECHNIQUE: Contiguous axial images were obtained from the base of the skull through the vertex  without intravenous contrast. COMPARISON:  Head CT dated 07/28/2017 FINDINGS: Brain: There is a 1 x 2 cm low attenuating area in the left cerebellum. A similar low attenuating area is noted in the right temporal lobe posteriorly as well as a focal low attenuating area in the gray-white matter junction of the right occipital lobe. Findings most consistent with areas of infarct, acute or subacute. Further evaluation with MRI is recommended. The ventricles and sulci appropriate size for patient's age. There is no acute intracranial hemorrhage. No midline shift. No extra-axial fluid collection. Vascular: No hyperdense vessel or unexpected calcification. Skull: Normal. Negative for fracture or focal lesion. Sinuses/Orbits: Extensive amount of venous gas in the facial musculature with involvement of the left masticator musculature, orbits, and air within the cavernous sinus. Other: None IMPRESSION: 1. No acute intracranial hemorrhage. 2. Multiple low attenuating foci bilaterally most consistent with areas of infarct, subacute or acute. The pattern of infarcts in various vascular distribution suggest a central thromboembolic etiology. Further  evaluation with MRI is recommended. Chest CT may provide additional evaluation of the central vasculature. 3. Air within the cavernous sinus as well as within the veins of the face and orbits of indeterminate etiology but may be related to IV access/injection. These results were called by telephone at the time of interpretation on 11/14/2017 at 8:06 pm to Dr. Blane OharaJOSHUA ZAVITZ , who verbally acknowledged these results. Electronically Signed   By: Elgie CollardArash  Radparvar M.D.   On: 12/14/2017 20:08   Dg Chest Portable 1 View  Result Date: 12/14/2017 CLINICAL DATA:  Initial evaluation for central line placement. EXAM: PORTABLE CHEST 1 VIEW COMPARISON:  Of prior radiograph from earlier same day. FINDINGS: Interval placement of right IJ approach central venous catheter with tip overlying the distal SVC. Patient now intubated with the tip of an endotracheal tube positioned 3.5 cm above the carina. Enteric tube courses in in the abdomen. Stable cardiomegaly with prosthetic valve. Mediastinal silhouette normal. Lungs mildly hypoinflated. Increased pulmonary vascular congestion and interstitial prominence as compared to previous, suggesting mildly progressive edema. Patchy right basilar opacity may reflect superimposed atelectasis or infiltrate. Small right pleural effusion. No pneumothorax. No acute osseus abnormality. IMPRESSION: 1. Right IJ approach centra venous catheter in place with tip overlying the distal SVC. 2. Tip of the endotracheal tube positioned 3.5 cm above the carina. 3. Interval worsening in mild diffuse pulmonary interstitial edema with small right pleural effusion. Superimposed right basilar opacity may reflect atelectasis or infiltrate. Electronically Signed   By: Rise MuBenjamin  McClintock M.D.   On: 12/13/2017 23:23   Dg Chest Portable 1 View  Result Date: 11/25/2017 CLINICAL DATA:  Altered mental status and difficulty breathing. EXAM: PORTABLE CHEST 1 VIEW COMPARISON:  08/12/2017 and chest CT 07/30/2017  FINDINGS: Sternotomy wires and prosthetic heart valve unchanged. Lungs are adequately inflated with chronic interstitial changes in the right base. Slightly more confluent opacification over these interstitial changes of the right base as a superimposed early infectious process is possible. No evidence of effusion. Mild stable cardiomegaly. Remainder of the exam is unchanged. IMPRESSION: Chronic interstitial changes right base with slightly more confluent opacification in the right base as early superimposed infection is possible. Mild stable cardiomegaly. Electronically Signed   By: Elberta Fortisaniel  Boyle M.D.   On: 11/28/2017 17:50     STUDIES:  CXR 12/22 > Chronic interstitial changes right base with slightly more confluent opacification in the right base as early superimposed infection is possible.Mild stable cardiomegaly CT Head 12/22 > 1. No acute intracranial  hemorrhage. 2. Multiple low attenuating foci bilaterally most consistent with areas of infarct, subacute or acute. The pattern of infarcts in various vascular distribution suggest a central thromboembolic etiology. Further evaluation with MRI is recommended. Chest CT may provide additional evaluation of the central vasculature. 3. Air within the cavernous sinus as well as within the veins of the face and orbits of indeterminate etiology but may be related to IV access/injection. Echo 12/23 >> EF 25%, diffuse hypokinesis with severe regurgitation.  Large vegetation on mitral valve.  Severely reduced RV function.  CULTURES: Blood 12/23 >> MRSA Urine 12/23 >> Sputum 12/23 >>  ANTIBIOTICS: Rocephin 12/23 >> Daptomycin 12/23 >>   SIGNIFICANT EVENTS: 12/22 > Presents to ED   LINES/TUBES: ETT 12/23 >>  CVL 12/23 >>  DISCUSSION: 31 year old male with h/o of IVDA and MSSA endocarditis s/p MVR and TVR at [facility] in 2016. Presents with AMS, CT Head with low attenuating foci bilaterally. ECHO with large vegetation of prosthetic mitral valve and  mitral stenosis with EF 20%. Intubated.   ASSESSMENT / PLAN:  PULMONARY A: Acute Hypoxic Respiratory Failure CXR with chronic interstitial changes right base more confluent opacification  P:   Continue vent support Follow CXR, ABG  CARDIOVASCULAR A:  Septic Shock  Endocarditis s/p MV and TV Bioprostetic replacement in 2016 Chronic Systolic Heart Failure (EF 45-50, aortic valve has some calcification and is mildly leaky)  Troponin 11.30 P:  Appreciate recommendations from cardiology and cardiothoracic surgery Hold home coumadin/Anticogulation per neurology as below  Not sure why he needs to be on anticoagulation as the valves are bioprosthetic  RENAL A:   Acute Kidney Injury  Crt 2 >  Anion Gap Metabolic Acidosis  LA 5.19 >   P:   Continue bicarb Follow urine output and creatinine  GASTROINTESTINAL A:   SUP  P:   NPO PPI  HEMATOLOGIC A:   Supratherapeutic INR  On Coumadin as an outpatient. 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 Discssed with Dr. Laurence Slate.  Wean Fentanyl gtt to achieve RASS PRN versed   FAMILY  - Updates: Unable to reach family. Attempted to call numbers listed on Facesheet for mother and brother. Awaiting response from family. Prognosis is dismal. - Inter-disciplinary family meet or Palliative Care meeting due by: 12/30     The patient is critically ill  with multiple organ system failure and requires high complexity decision making for assessment and support, frequent evaluation and titration of therapies, advanced monitoring, review of radiographic studies and interpretation of complex data.   Critical Care Time devoted to patient care services, exclusive of separately billable procedures, described in this note is 35 minutes.   Chilton Greathouse MD [facility] Pager [phone] If no answer or after 3pm call: [phone] 12/06/2017, 8:04 AM

## 2017-12-06 NOTE — Progress Notes (Signed)
PHARMACY - PHYSICIAN COMMUNICATION CRITICAL VALUE ALERT - BLOOD CULTURE IDENTIFICATION (BCID)  Norman Reed is an 31 y.o. male who presented to [facility] on 12-19-2017 with a chief complaint of fevers/SOB found to have endocarditis  Assessment:  MRSA bacteremia/endocarditis  Name of physician (or Provider) Contacted:  N/A  Current antibiotics:  Rocephin and Cubicin as per ID recommendations  Changes to prescribed antibiotics recommended:  None at this time  Results for orders placed or performed during the hospital encounter of 19-Dec-2017  Blood Culture ID Panel (Reflexed) (Collected: December 19, 2017  5:05 PM)  Result Value Ref Range   Enterococcus species NOT DETECTED NOT DETECTED   Listeria monocytogenes NOT DETECTED NOT DETECTED   Staphylococcus species DETECTED (A) NOT DETECTED   Staphylococcus aureus DETECTED (A) NOT DETECTED   Methicillin resistance DETECTED (A) NOT DETECTED   Streptococcus species NOT DETECTED NOT DETECTED   Streptococcus agalactiae NOT DETECTED NOT DETECTED   Streptococcus pneumoniae NOT DETECTED NOT DETECTED   Streptococcus pyogenes NOT DETECTED NOT DETECTED   Acinetobacter baumannii NOT DETECTED NOT DETECTED   Enterobacteriaceae species NOT DETECTED NOT DETECTED   Enterobacter cloacae complex NOT DETECTED NOT DETECTED   Escherichia coli NOT DETECTED NOT DETECTED   Klebsiella oxytoca NOT DETECTED NOT DETECTED   Klebsiella pneumoniae NOT DETECTED NOT DETECTED   Proteus species NOT DETECTED NOT DETECTED   Serratia marcescens NOT DETECTED NOT DETECTED   Haemophilus influenzae NOT DETECTED NOT DETECTED   Neisseria meningitidis NOT DETECTED NOT DETECTED   Pseudomonas aeruginosa NOT DETECTED NOT DETECTED   Candida albicans NOT DETECTED NOT DETECTED   Candida glabrata NOT DETECTED NOT DETECTED   Candida krusei NOT DETECTED NOT DETECTED   Candida parapsilosis NOT DETECTED NOT DETECTED   Candida tropicalis NOT DETECTED NOT DETECTED    Eddie Candle 12/06/2017  6:44 AM

## 2017-12-06 NOTE — Progress Notes (Signed)
Dr. Ladona Ridgel informed of pts tachycardia. He gave a telephonic verbal order for 250 cc bolus of normal saline over one hour for one occurrence.

## 2017-12-06 NOTE — Progress Notes (Signed)
Pts mother has arrived. She tearfully stated she could not wait in the patient's room so she was escorted to the conference room to wait for Dr. Isaiah Serge. Dr. Isaiah Serge informed of her arrival.

## 2017-12-06 NOTE — Consult Note (Signed)
Requesting Physician: Jovita Kussmaul    Chief Complaint: AMS/Strokes on CT scan   History obtained from: Chart    HPI:                                                                                                                                       Norman Reed is an 31 y.o. male  PMH of IV Drug Abuse, MV and TV replacement in 2016 at [facility] due to MSSA  Endocarditis, chronic systolic HF on Warfarin  presents altered mental status, fever and difficulty breathing.  He was intubated for severe metabolic acidosis.  Bedside ultrasound showed large mitral valve vegetation with mitral stenosis, smaller tricuspid valve vegetation. CT head was performed which showed multifocal acute and subacute strokes. Neurology was consulted for recommendations.  His INR is 5.6 on arrival. Also in renal failure. UDS positive for amphetamines. Not a candidate for tPA as outside window, cx in case of endocarditis and already on anticoagulation.    Past Medical History:  Diagnosis Date  . Anemia   . Anxiety   . Chronic systolic dysfunction of right ventricle 10/2015   Following large R sided TV Vegetation related PE  . Daily headache   . Depression    Restarted Welbutrin  . Hepatitis C    "not treated for it yet" (08/12/2017)  . 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  Marland Kitchen History of cardiac arrest ~ 01/2016   "in ER; 20 minutes after I was given MSO4; I'm not allergic to MSO4; might have gotten a little too much"  . 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 IV drug abuse (HCC)    Hoping to start In-patient counseling  . Myocardial infarction (HCC)    "X 2 a couple months ago; I ddidn't even know about them" (08/12/2017)  . 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   . 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    Past Surgical History:  Procedure Laterality Date  . CARDIAC CATHETERIZATION  11/2015   [facility]: Pre-op MVR/TVR - no significant CAD (report not available)  . CARDIAC VALVE REPLACEMENT  11/2015   [facility]: Combined Mitral & Tricuspic Valve - Mechanical Prosthesis; Pulmonary Throbolectomy (R side).  . CARDIOVERSION  11/2015   Afib associated with MV & TV Endocarditis & mycotic pulmonary embolism (R lung)  . KNEE ARTHROSCOPY Left ~ 2009  . TEE WITHOUT CARDIOVERSION N/A 07/30/2017   Procedure: TRANSESOPHAGEAL ECHOCARDIOGRAM (TEE);  Surgeon: Dolores Patty, MD: EF 40-45% with diffuse hypokinesis. Moderate aortic regurgitation -  with no vegetation noted. Small mechanical mitral valve vegetation (6x14 mm) noted. No tricuspid vegetation noted. RV remains dilated with moderate severe dysfunction. Both atria are enlarged. Worsening aortic insufficiency.  . TRANSTHORACIC ECHOCARDIOGRAM  10/2015   [facility]: EF 55-60%. Dilated RV with evidence of pressure and volume overload. Severe tricuspid regurgitation due to tricuspid valve vegetations. Dilated RV. Medium-sized pericardial fusion  . TRANSTHORACIC ECHOCARDIOGRAM  01/2016   [facility]) Post Cardiac Arrest: EF ~25-30%. Inferior Akinesis & globak Hypokinesis    Family History  Problem Relation Age of Onset  . Hyperlipidemia Maternal Grandmother   . Hyperlipidemia Paternal Grandfather    Social History:  reports that he has been smoking cigarettes.  He has a 3.25 pack-year smoking history. His smokeless tobacco use includes chew. He reports that he uses drugs. Drugs: Methamphetamines, Marijuana, and Other-see comments. He reports that he does not drink alcohol.  Allergies:  Allergies  Allergen Reactions  . Penicillins Other (See Comments)    Childhood reaction  . Vancomycin     Other  reaction(s): Other (See Comments) Patient with generalized pruritic erythematous flat indistinct macules, worst on his neck, after completion of vancomycin 1.38 milligrams over 70 minutes. Patient given Benadryl. 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     Medications:                                                                                                                        I reviewed home medications. On warfarin    ROS:                                                                                                                                     14 systems reviewed and negative except above    Examination:                                                                                                      General: Appears well-developed and well-nourished.  Psych: Affect appropriate to situation Eyes: No scleral injection HENT: No OP obstrucion Head: Normocephalic.  Cardiovascular: Normal rate and regular rhythm.  Respiratory: Effort normal and breath sounds normal to anterior ascultation GI: Soft.  No distension. There is no tenderness.  Skin: WDI Ext: no obvious splinter hemorrhages   Neurological Examination - limited exam due to intubation and sedation  MS: Opens eyes to voice CN: PERRLA, no gaze deviation Motor : Move all 4 extremities briskly and fighting ventilation  Sensory: withdraws equally on both sides to noxious stimuli  Plantars: down going     Lab Results: Basic Metabolic Panel: Recent Labs  Lab 12/14/2017 1710  NA 129*  K 4.3  CL 96*  CO2 19*  GLUCOSE 110*  BUN 43*  CREATININE 2.00*  CALCIUM 8.6*    CBC: Recent Labs  Lab 12/04/2017 1710  WBC 27.9*  NEUTROABS 24.3*  HGB 12.9*  HCT 37.1*  MCV 73.2*  PLT 37*    Coagulation Studies: Recent Labs    11/18/2017 1710  LABPROT 50.0*  INR 5.56*    Imaging: Ct Head Wo Contrast  Result Date: 11/27/2017 CLINICAL DATA:  31 year old male with  altered mental status. EXAM: CT HEAD WITHOUT CONTRAST TECHNIQUE: Contiguous axial images were obtained from the base of the skull through the vertex without intravenous contrast. COMPARISON:  Head CT dated 07/28/2017 FINDINGS: Brain: There is a 1 x 2 cm low attenuating area in the left cerebellum. A similar low attenuating area is noted in the right temporal lobe posteriorly as well as a focal low attenuating area in the gray-white matter junction of the right occipital lobe. Findings most consistent with areas of infarct, acute or subacute. Further evaluation with MRI is recommended. The ventricles and sulci appropriate size for patient's age. There is no acute intracranial hemorrhage. No midline shift. No extra-axial fluid collection. Vascular: No hyperdense vessel or unexpected calcification. Skull: Normal. Negative for fracture or focal lesion. Sinuses/Orbits: Extensive amount of venous gas in the facial musculature with involvement of the left masticator musculature, orbits, and air within the cavernous sinus. Other: None IMPRESSION: 1. No acute intracranial hemorrhage. 2. Multiple low attenuating foci bilaterally most consistent with areas of infarct, subacute or acute. The pattern of infarcts in various vascular distribution suggest a central thromboembolic etiology. Further evaluation with MRI is recommended. Chest CT may provide additional evaluation of the central vasculature. 3. Air within the cavernous sinus as well as within the veins of the face and orbits of indeterminate etiology but may be related to IV access/injection. These results were called by telephone at the time of interpretation on 11/19/2017 at 8:06 pm to Dr. Blane Ohara , who verbally acknowledged these results. Electronically Signed   By: Elgie Collard M.D.   On: 12/08/2017 20:08   Dg Chest Portable 1 View  Result Date: 12/12/2017 CLINICAL DATA:  Initial evaluation for central line placement. EXAM: PORTABLE CHEST 1 VIEW  COMPARISON:  Of prior radiograph from earlier same day. FINDINGS: Interval placement of right IJ approach central venous catheter with tip overlying the distal SVC. Patient now intubated with the tip of an endotracheal tube positioned 3.5 cm above the carina. Enteric tube courses in in the abdomen. Stable cardiomegaly with prosthetic valve. Mediastinal silhouette normal. Lungs mildly hypoinflated. Increased pulmonary vascular congestion and interstitial prominence as compared to previous, suggesting mildly progressive edema. Patchy right basilar opacity may reflect superimposed atelectasis or infiltrate. Small right pleural effusion. No pneumothorax. No acute osseus abnormality. IMPRESSION: 1. Right IJ  approach centra venous catheter in place with tip overlying the distal SVC. 2. Tip of the endotracheal tube positioned 3.5 cm above the carina. 3. Interval worsening in mild diffuse pulmonary interstitial edema with small right pleural effusion. Superimposed right basilar opacity may reflect atelectasis or infiltrate. Electronically Signed   By: Rise MuBenjamin  McClintock M.D.   On: 08-04-2017 23:23   Dg Chest Portable 1 View  Result Date: 08-04-2017 CLINICAL DATA:  Altered mental status and difficulty breathing. EXAM: PORTABLE CHEST 1 VIEW COMPARISON:  08/12/2017 and chest CT 07/30/2017 FINDINGS: Sternotomy wires and prosthetic heart valve unchanged. Lungs are adequately inflated with chronic interstitial changes in the right base. Slightly more confluent opacification over these interstitial changes of the right base as a superimposed early infectious process is possible. No evidence of effusion. Mild stable cardiomegaly. Remainder of the exam is unchanged. IMPRESSION: Chronic interstitial changes right base with slightly more confluent opacification in the right base as early superimposed infection is possible. Mild stable cardiomegaly. Electronically Signed   By: Elberta Fortisaniel  Boyle M.D.   On: 08-04-2017 17:50      ASSESSMENT AND PLAN  31 y.o. male  PMH of IV Drug Abuse, [facility]  Endocarditis, chronic systolic HF on Warfarin  presents altered mental status, fever and difficulty breathing. Found to have endocarditis with large mitral valve vegetation .  CT head shows multiple acute/subacute infarcts most likely septic emboli.    Acute/Subacute infarction due to Septic Emboli Patient appears to moving all 4 extremities briskly and opens eyes to command indicating he will likely NOT have significant neurological deficits from stroke. Hold AC and ASA for now as high risk of hemorrhagic conversion Infarct burden on CT scan is small. Please obtain MRI Brain when patient stable MR Angiogram it assess for mycotic aneurysms, especially if considering surgery Continue treatment  MSSA endocarditis with antibiotics Will likely need CT surgery for vegetation as he will continue throw septic emboli- however CT surgery felt he is not a candidate for surgery which does not look good for survival.   Georgiana SpinnerSushanth Aroor [facility] Pager Number [phone]

## 2017-12-06 NOTE — Progress Notes (Signed)
  Subjective: Intubated, sedated  Objective: Vital signs in last 24 hours: Temp:  [97.9 F (36.6 C)-103.2 F (39.6 C)] 98.2 F (36.8 C) (12/23 1130) Pulse Rate:  [63-290] 119 (12/23 1152) Cardiac Rhythm: Sinus tachycardia (12/23 0800) Resp:  [0-47] 30 (12/23 1152) BP: (77-148)/(37-95) 101/66 (12/23 1152) SpO2:  [86 %-100 %] 100 % (12/23 1152) FiO2 (%):  [40 %-100 %] 40 % (12/23 1152) Weight:  [168 lb 14 oz (76.6 kg)-177 lb (80.3 kg)] 174 lb 2.6 oz (79 kg) (12/23 0500)  Hemodynamic parameters for last 24 hours: CVP:  [0 mmHg-21 mmHg] 17 mmHg  Intake/Output from previous day: 12/22 0701 - 12/23 0700 In: 4055.9 [I.V.:764.9; NG/GT:70; IV Piggyback:3213] Out: 300 [Urine:300] Intake/Output this shift: Total I/O In: 1215.1 [I.V.:747.1; Blood:468] Out: 110 [Urine:110]  General appearance: toxic Heart: regular rate and rhythm Lungs: coarse BS bilaterally Extremities: multiple emboli  Lab Results: Recent Labs    12/12/2017 1710 12/06/17 0610  WBC 27.9* 33.1*  HGB 12.9* 12.1*  HCT 37.1* 35.7*  PLT 37* 27*   BMET:  Recent Labs    11/18/2017 1710 12/06/17 0610  NA 129* 135  K 4.3 4.3  CL 96* 105  CO2 19* 13*  GLUCOSE 110* 102*  BUN 43* 63*  CREATININE 2.00* 2.96*  CALCIUM 8.6* 7.5*    PT/INR:  Recent Labs    12/06/17 0610  LABPROT 56.8*  INR 6.54*   ABG    Component Value Date/Time   PHART 7.240 (L) 12/06/2017 0401   HCO3 12.0 (L) 12/06/2017 0401   TCO2 13 (L) 12/06/2017 0401   ACIDBASEDEF 14.0 (H) 12/06/2017 0401   O2SAT 99.0 12/06/2017 0401   CBG (last 3)  Recent Labs    12/06/17 0630 12/06/17 0716 12/06/17 1106  GLUCAP 96 121* 158*    Assessment/Plan: 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 He has acute renal failure with creatinine of 2.96 up from 2.0, altered mental status, severe LV and RV dysfunction and likely DIC with PLT count of 27 and INR of 56.8!  Not a  surgical candidate.  Continue with IV antibiotics and supportive care    LOS: 1 day    Loreli Slot 12/06/2017

## 2017-12-06 NOTE — Progress Notes (Signed)
Progress Note  Patient Name: Norman Reed Date of Encounter: 12/06/2017  Primary Cardiologist: Dr Ellyn Hack  Subjective   Intubated and sedated  Inpatient Medications    Scheduled Meds: . chlorhexidine gluconate (MEDLINE KIT)  15 mL Mouth Rinse BID  . fentaNYL      . mouth rinse  15 mL Mouth Rinse QID  . pantoprazole (PROTONIX) IV  40 mg Intravenous QHS  . phenylephrine       Continuous Infusions: . sodium chloride    . cefTRIAXone (ROCEPHIN)  IV    . DAPTOmycin (CUBICIN)  IV Stopped (12/13/2017 2029)  . dextrose 50 mL/hr at 12/06/17 0506  . fentaNYL infusion INTRAVENOUS 100 mcg/hr (12/06/17 0650)  . norepinephrine (LEVOPHED) Adult infusion 4 mcg/min (12/06/17 0314)  .  sodium bicarbonate  infusion 1000 mL 125 mL/hr at 12/06/17 6599   PRN Meds: sodium chloride, acetaminophen (TYLENOL) oral liquid 160 mg/5 mL, midazolam, midazolam   Vital Signs    Vitals:   12/06/17 0600 12/06/17 0615 12/06/17 0630 12/06/17 0645  BP: 99/60  (!) 99/48   Pulse: (!) 112 (!) 111 (!) 114 (!) 112  Resp: (!) 30 (!) 30 20 (!) 24  Temp: 98.2 F (36.8 C) 98.2 F (36.8 C) 98.1 F (36.7 C) 98.1 F (36.7 C)  TempSrc:      SpO2: 100% 100% 100% 100%  Weight:      Height:        Intake/Output Summary (Last 24 hours) at 12/06/2017 0801 Last data filed at 12/06/2017 0650 Gross per 24 hour  Intake 4055.88 ml  Output 300 ml  Net 3755.88 ml   Filed Weights   12/04/2017 1751 12/06/17 0026 12/06/17 0500  Weight: 177 lb (80.3 kg) 168 lb 14 oz (76.6 kg) 174 lb 2.6 oz (79 kg)    Telemetry    Sinus tachy - Personally Reviewed   Physical Exam   GEN: Intubated and sedated Neck: No JVD Cardiac: tachycardic  Respiratory: diffuse rhonchi GI: Soft, ND MS: No edema Neuro:  Not assessed as pt intubated and sedated   Labs    Chemistry Recent Labs  Lab 11/24/2017 1710 12/06/17 0610  NA 129* 135  K 4.3 4.3  CL 96* 105  CO2 19* 13*  GLUCOSE 110* 102*  BUN 43* 63*  CREATININE 2.00*  2.96*  CALCIUM 8.6* 7.5*  PROT 8.2*  --   ALBUMIN 2.9*  --   AST 134*  --   ALT 52  --   ALKPHOS 133*  --   BILITOT 4.5*  --   GFRNONAA 43* 27*  GFRAA 50* 31*  ANIONGAP 14 17*     Hematology Recent Labs  Lab 12/14/2017 1710 12/06/17 0610  WBC 27.9* 33.1*  RBC 5.07 4.76  HGB 12.9* 12.1*  HCT 37.1* 35.7*  MCV 73.2* 75.0*  MCH 25.4* 25.4*  MCHC 34.8 33.9  RDW 15.6* 15.8*  PLT 37* 27*    Cardiac Enzymes Recent Labs  Lab 11/17/2017 2107 12/06/17 0446  TROPONINI 9.06* 10.33*    Recent Labs  Lab 12/06/2017 1846  TROPIPOC 11.30*      Radiology    Ct Head Wo Contrast  Result Date: 11/29/2017 CLINICAL DATA:  31 year old male with altered mental status. EXAM: CT HEAD WITHOUT CONTRAST TECHNIQUE: Contiguous axial images were obtained from the base of the skull through the vertex without intravenous contrast. COMPARISON:  Head CT dated 07/28/2017 FINDINGS: Brain: There is a 1 x 2 cm low attenuating area in the left cerebellum. A  similar low attenuating area is noted in the right temporal lobe posteriorly as well as a focal low attenuating area in the gray-white matter junction of the right occipital lobe. Findings most consistent with areas of infarct, acute or subacute. Further evaluation with MRI is recommended. The ventricles and sulci appropriate size for patient's age. There is no acute intracranial hemorrhage. No midline shift. No extra-axial fluid collection. Vascular: No hyperdense vessel or unexpected calcification. Skull: Normal. Negative for fracture or focal lesion. Sinuses/Orbits: Extensive amount of venous gas in the facial musculature with involvement of the left masticator musculature, orbits, and air within the cavernous sinus. Other: None IMPRESSION: 1. No acute intracranial hemorrhage. 2. Multiple low attenuating foci bilaterally most consistent with areas of infarct, subacute or acute. The pattern of infarcts in various vascular distribution suggest a central  thromboembolic etiology. Further evaluation with MRI is recommended. Chest CT may provide additional evaluation of the central vasculature. 3. Air within the cavernous sinus as well as within the veins of the face and orbits of indeterminate etiology but may be related to IV access/injection. These results were called by telephone at the time of interpretation on 12/01/2017 at 8:06 pm to Dr. Elnora Morrison , who verbally acknowledged these results. Electronically Signed   By: Anner Crete M.D.   On: 11/24/2017 20:08   Dg Chest Portable 1 View  Result Date: 12/12/2017 CLINICAL DATA:  Initial evaluation for central line placement. EXAM: PORTABLE CHEST 1 VIEW COMPARISON:  Of prior radiograph from earlier same day. FINDINGS: Interval placement of right IJ approach central venous catheter with tip overlying the distal SVC. Patient now intubated with the tip of an endotracheal tube positioned 3.5 cm above the carina. Enteric tube courses in in the abdomen. Stable cardiomegaly with prosthetic valve. Mediastinal silhouette normal. Lungs mildly hypoinflated. Increased pulmonary vascular congestion and interstitial prominence as compared to previous, suggesting mildly progressive edema. Patchy right basilar opacity may reflect superimposed atelectasis or infiltrate. Small right pleural effusion. No pneumothorax. No acute osseus abnormality. IMPRESSION: 1. Right IJ approach centra venous catheter in place with tip overlying the distal SVC. 2. Tip of the endotracheal tube positioned 3.5 cm above the carina. 3. Interval worsening in mild diffuse pulmonary interstitial edema with small right pleural effusion. Superimposed right basilar opacity may reflect atelectasis or infiltrate. Electronically Signed   By: Jeannine Boga M.D.   On: 11/30/2017 23:23   Dg Chest Portable 1 View  Result Date: 12/14/2017 CLINICAL DATA:  Altered mental status and difficulty breathing. EXAM: PORTABLE CHEST 1 VIEW COMPARISON:   08/12/2017 and chest CT 07/30/2017 FINDINGS: Sternotomy wires and prosthetic heart valve unchanged. Lungs are adequately inflated with chronic interstitial changes in the right base. Slightly more confluent opacification over these interstitial changes of the right base as a superimposed early infectious process is possible. No evidence of effusion. Mild stable cardiomegaly. Remainder of the exam is unchanged. IMPRESSION: Chronic interstitial changes right base with slightly more confluent opacification in the right base as early superimposed infection is possible. Mild stable cardiomegaly. Electronically Signed   By: Marin Olp M.D.   On: 11/18/2017 17:50    Patient Profile     31 y.o. 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 He presents with embolic CVA;  also with sepsis, acute renal failure and thrombocytopenia. He is critically ill. Evaluated by surgery and felt not to be a candidate for redo MVR. Continue antibiotics. 2 elevated troponin-possibly related to embolic event to coronaries (ECG showed some degree of inferolateral ST elevation); however, not a candidate for cath. Would not treat with ASA given thrombocytopenia and this is more likely embolic and not related to ACS. 3 NICM-cannot add beta blocker or ARB as pt presently on pressors. 4 Acute renal failure-possibly related to sepsis/hypotension vs renal emboli. 5 coagulopathy-agree with DC coumadin and reversal given thrombocytopenia; valves are bioprosthetic. 6 Thrombocytopenia 7 H/O IVDA 8 s/p embolic CVA  Prognosis extremely poor. 30 min prolonged physician time (7:45-8:15 AM) For questions or updates, please contact [facility] Please consult [email or web address] for  contact info under Cardiology/STEMI.      Signed, Kirk Ruths, MD  12/06/2017, 8:01 AM

## 2017-12-06 NOTE — Consult Note (Addendum)
[facility]    Date of Admission:  11/24/2017   Total days of antibiotics: 1 ceftriaxone/vanco               Reason for Consult: Endocarditis    Referring Provider: Petit   Assessment: Endocarditis, relapsed, bioprosthetic MV and TV MRSA bacteremia IVDA Acute renal failure Coagulopathy CNS emboli   Plan: 1. Continue dapto 2. Repeat BCx in AM 3. Stop ceftriaxone 4. Add rifampin 5. Watch LFTs (his AST was up) 6. Hold on gent with his acute renal failure 7. appreciate eval by CVTS, neuro  Thank you so much for this interesting consult,  Active Problems:   Endocarditis   . chlorhexidine gluconate (MEDLINE KIT)  15 mL Mouth Rinse BID  . fentaNYL      . mouth rinse  15 mL Mouth Rinse QID  . pantoprazole (PROTONIX) IV  40 mg Intravenous QHS  . phenylephrine        HPI: Norman Reed is a 31 y.o. 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 He also had septic PE at that time. His BCx were (-). He has been on coumadin at home.  He returns 12-22 with fever, and altered mental status after continued IVDA. He was intbx in ED. He had bedside TTE in ED which showed large MV vegetation and EF 20%.  He also underwent CT head which showed multiple bilateral infarcts. His CXR shows a R basilar opacity.  He was started on ceftriaxone and daptomycin.  He remains on vent. He is BCID+ for MRSA (2/2).  HIs labs are notable for INR 5.56, WBC 27, PLT of 37. He received vit K.  Troponin 11.3 He is on norepi, bicarb, FFP.  His Cr has increased to 2.96 (prev 0.80 08-2017)  Review of Systems: Review of Systems  Unable to perform ROS: Intubated    Past Medical History:  Diagnosis Date  . Anemia   . Anxiety   . Chronic systolic dysfunction of right ventricle 10/2015   Following large R sided TV Vegetation related PE  . Daily headache   . Depression    Restarted Welbutrin  . Hepatitis C    "not treated for it yet" (08/12/2017)  . 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  Marland Kitchen History of cardiac arrest ~ 01/2016   "in ER; 20 minutes after I was given MSO4; I'm not allergic to MSO4; might have gotten a little too much"  . 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 IV drug abuse ([place])    Hoping to start In-patient counseling  . Myocardial infarction ([place])    "X 2 a couple months ago; I ddidn't even know about them" (08/12/2017)  . 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  . 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    Social History   Tobacco Use  . Smoking status: Current Every Day Smoker    Packs/day: 0.25    Years: 13.00    Pack years: 3.25    Types: Cigarettes  .  Smokeless tobacco: Current User    Types: Chew  Substance Use Topics  . Alcohol use: No  . Drug use: Yes    Types: Methamphetamines, Marijuana, Other-see comments    Comment: 08/12/2017 "clean since 07/21/2017; no opiates for 5 yr"    Family History  Problem Relation Age of Onset  . Hyperlipidemia Maternal Grandmother   . Hyperlipidemia Paternal Grandfather      Medications:  Scheduled: . chlorhexidine gluconate (MEDLINE KIT)  15 mL Mouth Rinse BID  . mouth rinse  15 mL Mouth Rinse QID  . pantoprazole (PROTONIX) IV  40 mg Intravenous QHS    Abtx:  Anti-infectives (From admission, onward)   Start     Dose/Rate Route Frequency Ordered Stop   12/06/17 1800  cefTRIAXone (ROCEPHIN) 2 g in dextrose 5 % 50 mL IVPB     2 g 100 mL/hr over 30 Minutes Intravenous Every 24 hours 12/06/17 0156     12/08/2017 1930  DAPTOmycin (CUBICIN) 650 mg in sodium chloride 0.9 %  IVPB     650 mg 226 mL/hr over 30 Minutes Intravenous Every 24 hours 12/09/2017 1832     12/09/2017 1845  cefTRIAXone (ROCEPHIN) 2 g in dextrose 5 % 50 mL IVPB     2 g 100 mL/hr over 30 Minutes Intravenous  Once 12/03/2017 1832 12/11/2017 1927        OBJECTIVE: Blood pressure 103/70, pulse (!) 114, temperature 97.9 F (36.6 C), resp. rate (!) 30, height 6' (1.829 m), weight 79 kg (174 lb 2.6 oz), SpO2 100 %.  Physical Exam  Constitutional: He appears not jaundiced. He appears not cachectic. He has a sickly appearance.  Eyes:  Pupils =  Neck: Neck supple.  R IJ  Cardiovascular: Tachycardia present.  Pulmonary/Chest: Breath sounds normal.  Abdominal: Soft. He exhibits no distension. Bowel sounds are hypoactive. There is no tenderness.  Musculoskeletal: He exhibits no edema.  Lymphadenopathy:    He has no cervical adenopathy.  Skin:       Lab Results Results for orders placed or performed during the hospital encounter of 11/25/2017 (from the past 48 hour(s))  Culture, blood (Routine x 2)     Status: None (Preliminary result)   Collection Time: 12/04/2017  5:05 PM  Result Value Ref Range   Specimen Description BLOOD BLOOD LEFT ARM    Special Requests      BOTTLES DRAWN AEROBIC AND ANAEROBIC Blood Culture adequate volume   Culture  Setup Time      IN BOTH AEROBIC AND ANAEROBIC BOTTLES GRAM POSITIVE COCCI IN CLUSTERS CRITICAL RESULT CALLED TO, READ BACK BY AND VERIFIED WITH: G ABBOTT(PHARMD) BY TCLEVELAND 12/06/17 AT 6:39AM    Culture GRAM POSITIVE COCCI    Report Status PENDING   Blood Culture ID Panel (Reflexed)     Status: Abnormal   Collection Time: 11/26/2017  5:05 PM  Result Value Ref Range   Enterococcus species NOT DETECTED NOT DETECTED   Listeria monocytogenes NOT DETECTED NOT DETECTED   Staphylococcus species DETECTED (A) NOT DETECTED    Comment: CRITICAL RESULT CALLED TO, READ BACK BY AND VERIFIED WITH: TO G ABBOTT(PHARMd)  BY TCLEVELAND 12/06/2017 AT 6:39AM     Staphylococcus aureus DETECTED (A) NOT DETECTED    Comment: Methicillin (oxacillin)-resistant Staphylococcus aureus (MRSA). MRSA is predictably resistant to beta-lactam antibiotics (except ceftaroline). Preferred therapy is vancomycin unless clinically contraindicated. Patient requires contact precautions if  hospitalized. CRITICAL RESULT CALLED TO, READ BACK BY AND VERIFIED WITH: TO G ABBOTT(PHARMd)  BY  [facility] 12/06/2017 AT 6:39AM    Methicillin resistance DETECTED (A) NOT DETECTED    Comment: CRITICAL RESULT CALLED TO, READ BACK BY AND VERIFIED WITH: TO G ABBOTT(PHARMd)  BY TCLEVELAND 12/06/2017 AT 6:39AM    Streptococcus species NOT DETECTED NOT DETECTED   Streptococcus agalactiae NOT DETECTED NOT DETECTED   Streptococcus pneumoniae NOT DETECTED NOT DETECTED   Streptococcus pyogenes NOT DETECTED NOT DETECTED   Acinetobacter baumannii NOT DETECTED NOT DETECTED   Enterobacteriaceae species NOT DETECTED NOT DETECTED   Enterobacter cloacae complex NOT DETECTED NOT DETECTED   Escherichia coli NOT DETECTED NOT DETECTED   Klebsiella oxytoca NOT DETECTED NOT DETECTED   Klebsiella pneumoniae NOT DETECTED NOT DETECTED   Proteus species NOT DETECTED NOT DETECTED   Serratia marcescens NOT DETECTED NOT DETECTED   Haemophilus influenzae NOT DETECTED NOT DETECTED   Neisseria meningitidis NOT DETECTED NOT DETECTED   Pseudomonas aeruginosa NOT DETECTED NOT DETECTED   Candida albicans NOT DETECTED NOT DETECTED   Candida glabrata NOT DETECTED NOT DETECTED   Candida krusei NOT DETECTED NOT DETECTED   Candida parapsilosis NOT DETECTED NOT DETECTED   Candida tropicalis NOT DETECTED NOT DETECTED  Comprehensive metabolic panel     Status: Abnormal   Collection Time: 11/30/2017  5:10 PM  Result Value Ref Range   Sodium 129 (L) 135 - 145 mmol/L   Potassium 4.3 3.5 - 5.1 mmol/L   Chloride 96 (L) 101 - 111 mmol/L   CO2 19 (L) 22 - 32 mmol/L   Glucose, Bld 110 (H) 65 - 99 mg/dL   BUN 43 (H) 6 - 20  mg/dL   Creatinine, Ser 2.00 (H) 0.61 - 1.24 mg/dL   Calcium 8.6 (L) 8.9 - 10.3 mg/dL   Total Protein 8.2 (H) 6.5 - 8.1 g/dL   Albumin 2.9 (L) 3.5 - 5.0 g/dL   AST 134 (H) 15 - 41 U/L   ALT 52 17 - 63 U/L   Alkaline Phosphatase 133 (H) 38 - 126 U/L   Total Bilirubin 4.5 (H) 0.3 - 1.2 mg/dL   GFR calc non Af Amer 43 (L) >60 mL/min   GFR calc Af Amer 50 (L) >60 mL/min    Comment: (NOTE) The eGFR has been calculated using the CKD EPI equation. This calculation has not been validated in all clinical situations. eGFR's persistently <60 mL/min signify possible Chronic Kidney Disease.    Anion gap 14 5 - 15  CBC with Differential     Status: Abnormal   Collection Time: 11/26/2017  5:10 PM  Result Value Ref Range   WBC 27.9 (H) 4.0 - 10.5 K/uL    Comment: REPEATED TO VERIFY   RBC 5.07 4.22 - 5.81 MIL/uL   Hemoglobin 12.9 (L) 13.0 - 17.0 g/dL   HCT 37.1 (L) 39.0 - 52.0 %   MCV 73.2 (L) 78.0 - 100.0 fL   MCH 25.4 (L) 26.0 - 34.0 pg   MCHC 34.8 30.0 - 36.0 g/dL   RDW 15.6 (H) 11.5 - 15.5 %   Platelets 37 (L) 150 - 400 K/uL    Comment: SPECIMEN CHECKED FOR CLOTS REPEATED TO VERIFY PLATELET COUNT CONFIRMED BY SMEAR    Neutrophils Relative % 85 %   Lymphocytes Relative 10 %   Monocytes Relative 3 %   Eosinophils Relative 0 %   Basophils Relative 0 %   Band Neutrophils 2 %   Metamyelocytes Relative 0 %   Myelocytes 0 %   Promyelocytes Absolute 0 %   Blasts 0 %  nRBC 0 0 /100 WBC   Neutro Abs 24.3 (H) 1.7 - 7.7 K/uL   Lymphs Abs 2.8 0.7 - 4.0 K/uL   Monocytes Absolute 0.8 0.1 - 1.0 K/uL   Eosinophils Absolute 0.0 0.0 - 0.7 K/uL   Basophils Absolute 0.0 0.0 - 0.1 K/uL   WBC Morphology TOXIC GRANULATION     Comment: HYPERSEGMENTED NEUT  Protime-INR     Status: Abnormal   Collection Time: 12/06/2017  5:10 PM  Result Value Ref Range   Prothrombin Time 50.0 (H) 11.4 - 15.2 seconds   INR 5.56 (HH)     Comment: REPEATED TO VERIFY CRITICAL RESULT CALLED TO, READ BACK BY AND VERIFIED  WITH: RN A DENNIS AT [id number] [phone] [place]   Culture, blood (single)     Status: None (Preliminary result)   Collection Time: 11/26/2017  5:10 PM  Result Value Ref Range   Specimen Description BLOOD RIGHT ANTECUBITAL    Special Requests IN PEDIATRIC BOTTLE Blood Culture adequate volume    Culture  Setup Time PED GRAM POSITIVE COCCI IN CLUSTERS    Culture PENDING    Report Status PENDING   I-Stat CG4 Lactic Acid, ED     Status: Abnormal   Collection Time: 12/04/2017  5:22 PM  Result Value Ref Range   Lactic Acid, Venous 4.88 (HH) 0.5 - 1.9 mmol/L   Comment NOTIFIED PHYSICIAN   Culture, blood (Routine x 2)     Status: None (Preliminary result)   Collection Time: 11/19/2017  5:30 PM  Result Value Ref Range   Specimen Description BLOOD BLOOD LEFT FOREARM    Special Requests IN PEDIATRIC BOTTLE Blood Culture adequate volume    Culture  Setup Time PED GRAM POSITIVE COCCI IN CLUSTERS     Culture PENDING    Report Status PENDING   I-Stat arterial blood gas, ED     Status: Abnormal   Collection Time: 12/11/2017  5:32 PM  Result Value Ref Range   pH, Arterial 7.516 (H) 7.350 - 7.450   pCO2 arterial 21.6 (L) 32.0 - 48.0 mmHg   pO2, Arterial 192.0 (H) 83.0 - 108.0 mmHg   Bicarbonate 17.2 (L) 20.0 - 28.0 mmol/L   TCO2 18 (L) 22 - 32 mmol/L   O2 Saturation 100.0 %   Acid-base deficit 3.0 (H) 0.0 - 2.0 mmol/L   Patient temperature 101.9 F    Collection site RADIAL, ALLEN'S TEST ACCEPTABLE    Drawn by RT    Sample type ARTERIAL    Comment NOTIFIED PHYSICIAN   Urinalysis, Routine w reflex microscopic     Status: Abnormal   Collection Time: 12/03/2017  5:50 PM  Result Value Ref Range   Color, Urine AMBER (A) YELLOW    Comment: BIOCHEMICALS MAY BE AFFECTED BY COLOR   APPearance CLOUDY (A) CLEAR   Specific Gravity, Urine 1.019 1.005 - 1.030   pH 5.0 5.0 - 8.0   Glucose, UA NEGATIVE NEGATIVE mg/dL   Hgb urine dipstick LARGE (A) NEGATIVE   Bilirubin Urine NEGATIVE NEGATIVE   Ketones, ur NEGATIVE  NEGATIVE mg/dL   Protein, ur 100 (A) NEGATIVE mg/dL   Nitrite NEGATIVE NEGATIVE   Leukocytes, UA NEGATIVE NEGATIVE   RBC / HPF TOO NUMEROUS TO COUNT 0 - 5 RBC/hpf   WBC, UA 6-30 0 - 5 WBC/hpf   Bacteria, UA RARE (A) NONE SEEN   Squamous Epithelial / LPF 0-5 (A) NONE SEEN   WBC Clumps PRESENT    Mucus PRESENT   I-Stat Troponin, ED (  not at [facility])     Status: Abnormal   Collection Time: 12/14/2017  6:46 PM  Result Value Ref Range   Troponin i, poc 11.30 (HH) 0.00 - 0.08 ng/mL   Comment NOTIFIED PHYSICIAN    Comment 3            Comment: Due to the release kinetics of cTnI, a negative result within the first hours of the onset of symptoms does not rule out myocardial infarction with certainty. If myocardial infarction is still suspected, repeat the test at appropriate intervals.   I-Stat CG4 Lactic Acid, ED     Status: Abnormal   Collection Time: 12/07/2017  6:48 PM  Result Value Ref Range   Lactic Acid, Venous 5.19 (HH) 0.5 - 1.9 mmol/L   Comment NOTIFIED PHYSICIAN   Lactic acid, plasma     Status: Abnormal   Collection Time: 11/16/2017  9:07 PM  Result Value Ref Range   Lactic Acid, Venous 4.7 (HH) 0.5 - 1.9 mmol/L    Comment: CRITICAL RESULT CALLED TO, READ BACK BY AND VERIFIED WITH: CABLE M,RN 11/25/2017 2327 WAYK   Troponin I (q 6hr x 3)     Status: Abnormal   Collection Time: 12/04/2017  9:07 PM  Result Value Ref Range   Troponin I 9.06 (HH) <0.03 ng/mL    Comment: CRITICAL RESULT CALLED TO, READ BACK BY AND VERIFIED WITH: STRENK J,RN 12/11/2017 2341 WAYK   Procalcitonin - Baseline     Status: None   Collection Time: 11/27/2017  9:07 PM  Result Value Ref Range   Procalcitonin 57.08 ng/mL    Comment:        Interpretation: PCT >= 10 ng/mL: Important systemic inflammatory response, almost exclusively due to severe bacterial sepsis or septic shock. (NOTE)       Sepsis PCT Algorithm           Lower Respiratory Tract                                      Infection PCT Algorithm     ----------------------------     ----------------------------         PCT < 0.25 ng/mL                PCT < 0.10 ng/mL         Strongly encourage             Strongly discourage   discontinuation of antibiotics    initiation of antibiotics    ----------------------------     -----------------------------       PCT 0.25 - 0.50 ng/mL            PCT 0.10 - 0.25 ng/mL               OR       >80% decrease in PCT            Discourage initiation of                                            antibiotics      Encourage discontinuation           of antibiotics    ----------------------------     -----------------------------         PCT >= 0.50  ng/mL              PCT 0.26 - 0.50 ng/mL                AND       <80% decrease in PCT             Encourage initiation of                                             antibiotics       Encourage continuation           of antibiotics    ----------------------------     -----------------------------        PCT >= 0.50 ng/mL                  PCT > 0.50 ng/mL               AND         increase in PCT                  Strongly encourage                                      initiation of antibiotics    Strongly encourage escalation           of antibiotics                                     -----------------------------                                           PCT <= 0.25 ng/mL                                                 OR                                        > 80% decrease in PCT                                     Discontinue / Do not initiate                                             antibiotics   Urine rapid drug screen (hosp performed)     Status: Abnormal   Collection Time: 11/17/2017  9:08 PM  Result Value Ref Range   Opiates NONE DETECTED NONE DETECTED   Cocaine NONE DETECTED NONE DETECTED   Benzodiazepines NONE DETECTED NONE DETECTED   Amphetamines POSITIVE (A) NONE DETECTED   Tetrahydrocannabinol NONE DETECTED NONE DETECTED    Barbiturates NONE DETECTED NONE  DETECTED    Comment: (NOTE) DRUG SCREEN FOR MEDICAL PURPOSES ONLY.  IF CONFIRMATION IS NEEDED FOR ANY PURPOSE, NOTIFY LAB WITHIN 5 DAYS. LOWEST DETECTABLE LIMITS FOR URINE DRUG SCREEN Drug Class                     Cutoff (ng/mL) Amphetamine and metabolites    1000 Barbiturate and metabolites    200 Benzodiazepine                 062 Tricyclics and metabolites     300 Opiates and metabolites        300 Cocaine and metabolites        300 THC                            50   I-Stat arterial blood gas, ED     Status: Abnormal   Collection Time: 11/19/2017  9:32 PM  Result Value Ref Range   pH, Arterial 7.402 7.350 - 7.450   pCO2 arterial 21.6 (L) 32.0 - 48.0 mmHg   pO2, Arterial 107.0 83.0 - 108.0 mmHg   Bicarbonate 13.1 (L) 20.0 - 28.0 mmol/L   TCO2 14 (L) 22 - 32 mmol/L   O2 Saturation 98.0 %   Acid-base deficit 9.0 (H) 0.0 - 2.0 mmol/L   Patient temperature 103.2 F    Collection site RADIAL, ALLEN'S TEST ACCEPTABLE    Drawn by RT    Sample type ARTERIAL    Comment NOTIFIED PHYSICIAN   MRSA PCR Screening     Status: None   Collection Time: 12/06/17 12:01 AM  Result Value Ref Range   MRSA by PCR NEGATIVE NEGATIVE    Comment:        The GeneXpert MRSA Assay (FDA approved for NASAL specimens only), is one component of a comprehensive MRSA colonization surveillance program. It is not intended to diagnose MRSA infection nor to guide or monitor treatment for MRSA infections.   I-STAT 3, arterial blood gas (G3+)     Status: Abnormal   Collection Time: 12/06/17 12:11 AM  Result Value Ref Range   pH, Arterial 7.093 (LL) 7.350 - 7.450   pCO2 arterial 56.6 (H) 32.0 - 48.0 mmHg   pO2, Arterial 503.0 (H) 83.0 - 108.0 mmHg   Bicarbonate 16.7 (L) 20.0 - 28.0 mmol/L   TCO2 18 (L) 22 - 32 mmol/L   O2 Saturation 100.0 %   Acid-base deficit 13.0 (H) 0.0 - 2.0 mmol/L   Patient temperature 39.2 C    Collection site RADIAL, ALLEN'S TEST ACCEPTABLE     Sample type ARTERIAL    Comment NOTIFIED PHYSICIAN   Glucose, capillary     Status: None   Collection Time: 12/06/17 12:37 AM  Result Value Ref Range   Glucose-Capillary 69 65 - 99 mg/dL   Comment 1 Notify RN   Lactic acid, plasma     Status: Abnormal   Collection Time: 12/06/17  1:46 AM  Result Value Ref Range   Lactic Acid, Venous 6.5 (HH) 0.5 - 1.9 mmol/L    Comment: CRITICAL RESULT CALLED TO, READ BACK BY AND VERIFIED WITH: TO JSTENK(RN) BY TCLEVELAND 12/06/2017 AT 2:51AM   Glucose, capillary     Status: Abnormal   Collection Time: 12/06/17  3:51 AM  Result Value Ref Range   Glucose-Capillary 62 (L) 65 - 99 mg/dL   Comment 1 Notify RN   I-STAT 3, arterial blood gas (G3+)  Status: Abnormal   Collection Time: 12/06/17  4:01 AM  Result Value Ref Range   pH, Arterial 7.240 (L) 7.350 - 7.450   pCO2 arterial 28.2 (L) 32.0 - 48.0 mmHg   pO2, Arterial 188.0 (H) 83.0 - 108.0 mmHg   Bicarbonate 12.0 (L) 20.0 - 28.0 mmol/L   TCO2 13 (L) 22 - 32 mmol/L   O2 Saturation 99.0 %   Acid-base deficit 14.0 (H) 0.0 - 2.0 mmol/L   Patient temperature 100.2 F    Collection site RADIAL, ALLEN'S TEST ACCEPTABLE    Drawn by RT    Sample type ARTERIAL   Troponin I (q 6hr x 3)     Status: Abnormal   Collection Time: 12/06/17  4:46 AM  Result Value Ref Range   Troponin I 10.33 (HH) <0.03 ng/mL    Comment: CRITICAL VALUE NOTED.  VALUE IS CONSISTENT WITH PREVIOUSLY REPORTED AND CALLED VALUE.  Procalcitonin     Status: None   Collection Time: 12/06/17  4:46 AM  Result Value Ref Range   Procalcitonin 95.18 ng/mL    Comment:        Interpretation: PCT >= 10 ng/mL: Important systemic inflammatory response, almost exclusively due to severe bacterial sepsis or septic shock. (NOTE)       Sepsis PCT Algorithm           Lower Respiratory Tract                                      Infection PCT Algorithm    ----------------------------     ----------------------------         PCT < 0.25 ng/mL                 PCT < 0.10 ng/mL         Strongly encourage             Strongly discourage   discontinuation of antibiotics    initiation of antibiotics    ----------------------------     -----------------------------       PCT 0.25 - 0.50 ng/mL            PCT 0.10 - 0.25 ng/mL               OR       >80% decrease in PCT            Discourage initiation of                                            antibiotics      Encourage discontinuation           of antibiotics    ----------------------------     -----------------------------         PCT >= 0.50 ng/mL              PCT 0.26 - 0.50 ng/mL                AND       <80% decrease in PCT             Encourage initiation of  antibiotics       Encourage continuation           of antibiotics    ----------------------------     -----------------------------        PCT >= 0.50 ng/mL                  PCT > 0.50 ng/mL               AND         increase in PCT                  Strongly encourage                                      initiation of antibiotics    Strongly encourage escalation           of antibiotics                                     -----------------------------                                           PCT <= 0.25 ng/mL                                                 OR                                        > 80% decrease in PCT                                     Discontinue / Do not initiate                                             antibiotics   Lactic acid, plasma     Status: Abnormal   Collection Time: 12/06/17  4:47 AM  Result Value Ref Range   Lactic Acid, Venous 7.5 (HH) 0.5 - 1.9 mmol/L    Comment: CRITICAL RESULT CALLED TO, READ BACK BY AND VERIFIED WITH: [facility] J,RN 12/06/17 0551 WAYK   Phosphorus     Status: Abnormal   Collection Time: 12/06/17  6:10 AM  Result Value Ref Range   Phosphorus 8.0 (H) 2.5 - 4.6 mg/dL  Magnesium     Status: None   Collection Time:  12/06/17  6:10 AM  Result Value Ref Range   Magnesium 2.3 1.7 - 2.4 mg/dL  CBC     Status: Abnormal   Collection Time: 12/06/17  6:10 AM  Result Value Ref Range   WBC 33.1 (H) 4.0 - 10.5 K/uL   RBC 4.76 4.22 - 5.81 MIL/uL   Hemoglobin 12.1 (L) 13.0 - 17.0 g/dL   HCT 35.7 (L) 39.0 - 52.0 %  MCV 75.0 (L) 78.0 - 100.0 fL   MCH 25.4 (L) 26.0 - 34.0 pg   MCHC 33.9 30.0 - 36.0 g/dL   RDW 15.8 (H) 11.5 - 15.5 %   Platelets 27 (LL) 150 - 400 K/uL    Comment: REPEATED TO VERIFY SPECIMEN CHECKED FOR CLOTS PLATELET COUNT CONFIRMED BY SMEAR CRITICAL RESULT CALLED TO, READ BACK BY AND VERIFIED WITH: MELANIE FOWLER RN AT 0258 12/06/17 BY [facility]   Basic metabolic panel     Status: Abnormal   Collection Time: 12/06/17  6:10 AM  Result Value Ref Range   Sodium 135 135 - 145 mmol/L   Potassium 4.3 3.5 - 5.1 mmol/L   Chloride 105 101 - 111 mmol/L   CO2 13 (L) 22 - 32 mmol/L   Glucose, Bld 102 (H) 65 - 99 mg/dL   BUN 63 (H) 6 - 20 mg/dL   Creatinine, Ser 2.96 (H) 0.61 - 1.24 mg/dL   Calcium 7.5 (L) 8.9 - 10.3 mg/dL   GFR calc non Af Amer 27 (L) >60 mL/min   GFR calc Af Amer 31 (L) >60 mL/min    Comment: (NOTE) The eGFR has been calculated using the CKD EPI equation. This calculation has not been validated in all clinical situations. eGFR's persistently <60 mL/min signify possible Chronic Kidney Disease.    Anion gap 17 (H) 5 - 15  Protime-INR     Status: Abnormal   Collection Time: 12/06/17  6:10 AM  Result Value Ref Range   Prothrombin Time 56.8 (H) 11.4 - 15.2 seconds   INR 6.54 (HH)     Comment: REPEATED TO VERIFY CRITICAL RESULT CALLED TO, READ BACK BY AND VERIFIED WITH: Dennie Maizes RN [id number] 12/06/17 HMILES   Glucose, capillary     Status: None   Collection Time: 12/06/17  6:30 AM  Result Value Ref Range   Glucose-Capillary 96 65 - 99 mg/dL  Glucose, capillary     Status: Abnormal   Collection Time: 12/06/17  7:16 AM  Result Value Ref Range   Glucose-Capillary 121 (H) 65 - 99  mg/dL   Comment 1 Capillary Specimen    Comment 2 Notify RN   Prepare fresh frozen plasma     Status: None (Preliminary result)   Collection Time: 12/06/17  8:22 AM  Result Value Ref Range   Unit Number [id number]    Blood Component Type THWPLS APHR2    Unit division 00    Status of Unit ISSUED    Transfusion Status OK TO TRANSFUSE    Unit Number [id number]    Blood Component Type THAWED PLASMA    Unit division 00    Status of Unit ISSUED    Transfusion Status OK TO TRANSFUSE   ABO/Rh     Status: None   Collection Time: 12/06/17  8:52 AM  Result Value Ref Range   ABO/RH(D) O POS       Component Value Date/Time   SDES BLOOD BLOOD LEFT FOREARM 12/04/2017 1730   SPECREQUEST IN PEDIATRIC BOTTLE Blood Culture adequate volume 12/09/2017 1730   CULT PENDING 12/07/2017 1730   REPTSTATUS PENDING 12/13/2017 1730   Ct Head Wo Contrast  Result Date: 11/23/2017 CLINICAL DATA:  31 year old male with altered mental status. EXAM: CT HEAD WITHOUT CONTRAST TECHNIQUE: Contiguous axial images were obtained from the base of the skull through the vertex without intravenous contrast. COMPARISON:  Head CT dated 07/28/2017 FINDINGS: Brain: There is a 1 x 2 cm low attenuating area in the left  cerebellum. A similar low attenuating area is noted in the right temporal lobe posteriorly as well as a focal low attenuating area in the gray-white matter junction of the right occipital lobe. Findings most consistent with areas of infarct, acute or subacute. Further evaluation with MRI is recommended. The ventricles and sulci appropriate size for patient's age. There is no acute intracranial hemorrhage. No midline shift. No extra-axial fluid collection. Vascular: No hyperdense vessel or unexpected calcification. Skull: Normal. Negative for fracture or focal lesion. Sinuses/Orbits: Extensive amount of venous gas in the facial musculature with involvement of the left masticator musculature, orbits, and air within the  cavernous sinus. Other: None IMPRESSION: 1. No acute intracranial hemorrhage. 2. Multiple low attenuating foci bilaterally most consistent with areas of infarct, subacute or acute. The pattern of infarcts in various vascular distribution suggest a central thromboembolic etiology. Further evaluation with MRI is recommended. Chest CT may provide additional evaluation of the central vasculature. 3. Air within the cavernous sinus as well as within the veins of the face and orbits of indeterminate etiology but may be related to IV access/injection. These results were called by telephone at the time of interpretation on 11/23/2017 at 8:06 pm to Dr. Elnora Morrison , who verbally acknowledged these results. Electronically Signed   By: Anner Crete M.D.   On: 12/09/2017 20:08   Dg Chest Portable 1 View  Result Date: 11/18/2017 CLINICAL DATA:  Initial evaluation for central line placement. EXAM: PORTABLE CHEST 1 VIEW COMPARISON:  Of prior radiograph from earlier same day. FINDINGS: Interval placement of right IJ approach central venous catheter with tip overlying the distal SVC. Patient now intubated with the tip of an endotracheal tube positioned 3.5 cm above the carina. Enteric tube courses in in the abdomen. Stable cardiomegaly with prosthetic valve. Mediastinal silhouette normal. Lungs mildly hypoinflated. Increased pulmonary vascular congestion and interstitial prominence as compared to previous, suggesting mildly progressive edema. Patchy right basilar opacity may reflect superimposed atelectasis or infiltrate. Small right pleural effusion. No pneumothorax. No acute osseus abnormality. IMPRESSION: 1. Right IJ approach centra venous catheter in place with tip overlying the distal SVC. 2. Tip of the endotracheal tube positioned 3.5 cm above the carina. 3. Interval worsening in mild diffuse pulmonary interstitial edema with small right pleural effusion. Superimposed right basilar opacity may reflect atelectasis or  infiltrate. Electronically Signed   By: Jeannine Boga M.D.   On: 12/09/2017 23:23   Dg Chest Portable 1 View  Result Date: 11/30/2017 CLINICAL DATA:  Altered mental status and difficulty breathing. EXAM: PORTABLE CHEST 1 VIEW COMPARISON:  08/12/2017 and chest CT 07/30/2017 FINDINGS: Sternotomy wires and prosthetic heart valve unchanged. Lungs are adequately inflated with chronic interstitial changes in the right base. Slightly more confluent opacification over these interstitial changes of the right base as a superimposed early infectious process is possible. No evidence of effusion. Mild stable cardiomegaly. Remainder of the exam is unchanged. IMPRESSION: Chronic interstitial changes right base with slightly more confluent opacification in the right base as early superimposed infection is possible. Mild stable cardiomegaly. Electronically Signed   By: Marin Olp M.D.   On: 11/23/2017 17:50   Recent Results (from the past 240 hour(s))  Culture, blood (Routine x 2)     Status: None (Preliminary result)   Collection Time: 12/06/2017  5:05 PM  Result Value Ref Range Status   Specimen Description BLOOD BLOOD LEFT ARM  Final   Special Requests   Final    BOTTLES DRAWN AEROBIC AND ANAEROBIC Blood  Culture adequate volume   Culture  Setup Time   Final    IN BOTH AEROBIC AND ANAEROBIC BOTTLES GRAM POSITIVE COCCI IN CLUSTERS CRITICAL RESULT CALLED TO, READ BACK BY AND VERIFIED WITH: G ABBOTT(PHARMD) BY [facility] 12/06/17 AT 6:39AM    Culture GRAM POSITIVE COCCI  Final   Report Status PENDING  Incomplete  Blood Culture ID Panel (Reflexed)     Status: Abnormal   Collection Time: 11/25/2017  5:05 PM  Result Value Ref Range Status   Enterococcus species NOT DETECTED NOT DETECTED Final   Listeria monocytogenes NOT DETECTED NOT DETECTED Final   Staphylococcus species DETECTED (A) NOT DETECTED Final    Comment: CRITICAL RESULT CALLED TO, READ BACK BY AND VERIFIED WITH: TO G ABBOTT(PHARMd)  BY  TC[place]AND 12/06/2017 AT 6:39AM    Staphylococcus aureus DETECTED (A) NOT DETECTED Final    Comment: Methicillin (oxacillin)-resistant Staphylococcus aureus (MRSA). MRSA is predictably resistant to beta-lactam antibiotics (except ceftaroline). Preferred therapy is vancomycin unless clinically contraindicated. Patient requires contact precautions if  hospitalized. CRITICAL RESULT CALLED TO, READ BACK BY AND VERIFIED WITH: TO G ABBOTT(PHARMd)  BY [facility] 12/06/2017 AT 6:39AM    Methicillin resistance DETECTED (A) NOT DETECTED Final    Comment: CRITICAL RESULT CALLED TO, READ BACK BY AND VERIFIED WITH: TO G ABBOTT(PHARMd)  BY TCLEVELAND 12/06/2017 AT 6:39AM    Streptococcus species NOT DETECTED NOT DETECTED Final   Streptococcus agalactiae NOT DETECTED NOT DETECTED Final   Streptococcus pneumoniae NOT DETECTED NOT DETECTED Final   Streptococcus pyogenes NOT DETECTED NOT DETECTED Final   Acinetobacter baumannii NOT DETECTED NOT DETECTED Final   Enterobacteriaceae species NOT DETECTED NOT DETECTED Final   Enterobacter cloacae complex NOT DETECTED NOT DETECTED Final   Escherichia coli NOT DETECTED NOT DETECTED Final   Klebsiella oxytoca NOT DETECTED NOT DETECTED Final   Klebsiella pneumoniae NOT DETECTED NOT DETECTED Final   Proteus species NOT DETECTED NOT DETECTED Final   Serratia marcescens NOT DETECTED NOT DETECTED Final   Haemophilus influenzae NOT DETECTED NOT DETECTED Final   Neisseria meningitidis NOT DETECTED NOT DETECTED Final   Pseudomonas aeruginosa NOT DETECTED NOT DETECTED Final   Candida albicans NOT DETECTED NOT DETECTED Final   Candida glabrata NOT DETECTED NOT DETECTED Final   Candida krusei NOT DETECTED NOT DETECTED Final   Candida parapsilosis NOT DETECTED NOT DETECTED Final   Candida tropicalis NOT DETECTED NOT DETECTED Final  Culture, blood (single)     Status: None (Preliminary result)   Collection Time: 12/04/2017  5:10 PM  Result Value Ref Range Status    Specimen Description BLOOD RIGHT ANTECUBITAL  Final   Special Requests IN PEDIATRIC BOTTLE Blood Culture adequate volume  Final   Culture  Setup Time PED GRAM POSITIVE COCCI IN CLUSTERS  Final   Culture PENDING  Incomplete   Report Status PENDING  Incomplete  Culture, blood (Routine x 2)     Status: None (Preliminary result)   Collection Time: 12/13/2017  5:30 PM  Result Value Ref Range Status   Specimen Description BLOOD BLOOD LEFT FOREARM  Final   Special Requests IN PEDIATRIC BOTTLE Blood Culture adequate volume  Final   Culture  Setup Time PED GRAM POSITIVE COCCI IN CLUSTERS   Final   Culture PENDING  Incomplete   Report Status PENDING  Incomplete  MRSA PCR Screening     Status: None   Collection Time: 12/06/17 12:01 AM  Result Value Ref Range Status   MRSA by PCR NEGATIVE NEGATIVE Final  Comment:        The GeneXpert MRSA Assay (FDA approved for NASAL specimens only), is one component of a comprehensive MRSA colonization surveillance program. It is not intended to diagnose MRSA infection nor to guide or monitor treatment for MRSA infections.     Microbiology: Recent Results (from the past 240 hour(s))  Culture, blood (Routine x 2)     Status: None (Preliminary result)   Collection Time: 11/30/2017  5:05 PM  Result Value Ref Range Status   Specimen Description BLOOD BLOOD LEFT ARM  Final   Special Requests   Final    BOTTLES DRAWN AEROBIC AND ANAEROBIC Blood Culture adequate volume   Culture  Setup Time   Final    IN BOTH AEROBIC AND ANAEROBIC BOTTLES GRAM POSITIVE COCCI IN CLUSTERS CRITICAL RESULT CALLED TO, READ BACK BY AND VERIFIED WITH: G ABBOTT(PHARMD) BY TCLEVELAND 12/06/17 AT 6:39AM    Culture GRAM POSITIVE COCCI  Final   Report Status PENDING  Incomplete  Blood Culture ID Panel (Reflexed)     Status: Abnormal   Collection Time: 12/04/2017  5:05 PM  Result Value Ref Range Status   Enterococcus species NOT DETECTED NOT DETECTED Final   Listeria monocytogenes  NOT DETECTED NOT DETECTED Final   Staphylococcus species DETECTED (A) NOT DETECTED Final    Comment: CRITICAL RESULT CALLED TO, READ BACK BY AND VERIFIED WITH: TO G ABBOTT(PHARMd)  BY TCLEVELAND 12/06/2017 AT 6:39AM    Staphylococcus aureus DETECTED (A) NOT DETECTED Final    Comment: Methicillin (oxacillin)-resistant Staphylococcus aureus (MRSA). MRSA is predictably resistant to beta-lactam antibiotics (except ceftaroline). Preferred therapy is vancomycin unless clinically contraindicated. Patient requires contact precautions if  hospitalized. CRITICAL RESULT CALLED TO, READ BACK BY AND VERIFIED WITH: TO G ABBOTT(PHARMd)  BY [facility] 12/06/2017 AT 6:39AM    Methicillin resistance DETECTED (A) NOT DETECTED Final    Comment: CRITICAL RESULT CALLED TO, READ BACK BY AND VERIFIED WITH: TO G ABBOTT(PHARMd)  BY TCLEVELAND 12/06/2017 AT 6:39AM    Streptococcus species NOT DETECTED NOT DETECTED Final   Streptococcus agalactiae NOT DETECTED NOT DETECTED Final   Streptococcus pneumoniae NOT DETECTED NOT DETECTED Final   Streptococcus pyogenes NOT DETECTED NOT DETECTED Final   Acinetobacter baumannii NOT DETECTED NOT DETECTED Final   Enterobacteriaceae species NOT DETECTED NOT DETECTED Final   Enterobacter cloacae complex NOT DETECTED NOT DETECTED Final   Escherichia coli NOT DETECTED NOT DETECTED Final   Klebsiella oxytoca NOT DETECTED NOT DETECTED Final   Klebsiella pneumoniae NOT DETECTED NOT DETECTED Final   Proteus species NOT DETECTED NOT DETECTED Final   Serratia marcescens NOT DETECTED NOT DETECTED Final   Haemophilus influenzae NOT DETECTED NOT DETECTED Final   Neisseria meningitidis NOT DETECTED NOT DETECTED Final   Pseudomonas aeruginosa NOT DETECTED NOT DETECTED Final   Candida albicans NOT DETECTED NOT DETECTED Final   Candida glabrata NOT DETECTED NOT DETECTED Final   Candida krusei NOT DETECTED NOT DETECTED Final   Candida parapsilosis NOT DETECTED NOT DETECTED Final   Candida  tropicalis NOT DETECTED NOT DETECTED Final  Culture, blood (single)     Status: None (Preliminary result)   Collection Time: 11/27/2017  5:10 PM  Result Value Ref Range Status   Specimen Description BLOOD RIGHT ANTECUBITAL  Final   Special Requests IN PEDIATRIC BOTTLE Blood Culture adequate volume  Final   Culture  Setup Time PED GRAM POSITIVE COCCI IN CLUSTERS  Final   Culture PENDING  Incomplete   Report Status PENDING  Incomplete  Culture, blood (Routine x 2)     Status: None (Preliminary result)   Collection Time: 11/22/2017  5:30 PM  Result Value Ref Range Status   Specimen Description BLOOD BLOOD LEFT FOREARM  Final   Special Requests IN PEDIATRIC BOTTLE Blood Culture adequate volume  Final   Culture  Setup Time PED GRAM POSITIVE COCCI IN CLUSTERS   Final   Culture PENDING  Incomplete   Report Status PENDING  Incomplete  MRSA PCR Screening     Status: None   Collection Time: 12/06/17 12:01 AM  Result Value Ref Range Status   MRSA by PCR NEGATIVE NEGATIVE Final    Comment:        The GeneXpert MRSA Assay (FDA approved for NASAL specimens only), is one component of a comprehensive MRSA colonization surveillance program. It is not intended to diagnose MRSA infection nor to guide or monitor treatment for MRSA infections.     Radiographs and labs were personally reviewed by me.   Bobby Rumpf, MD [facility] [phone] 12/06/2017, 9:33 AM

## 2017-12-06 NOTE — Progress Notes (Signed)
STROKE TEAM PROGRESS NOTE   HISTORY OF PRESENT ILLNESS (per record) Norman Reed is an 31 y.o. male  PMH of IV Drug Abuse, MV and [facility]  Endocarditis, chronic systolic HF on Warfarin  presents altered mental status, fever and difficulty breathing. He was intubated for severe metabolic acidosis. Bedside ultrasound showed large mitral valve vegetation with mitral stenosis, smaller tricuspid valve vegetation. CT head was performed which showed multifocal acute and subacute strokes. Neurology was consulted for recommendations.  His INR is 5.6 on arrival. Also in renal failure. UDS positive for amphetamines. Not a candidate for tPA as outside window, cx in case of endocarditis and already on anticoagulation.    SUBJECTIVE (INTERVAL HISTORY) No family members present. A friend is asleep at the bedside. The patient is intubated and sedated. He responds to painful stimuli only. The nurse reported that he was out of for not sedated.   OBJECTIVE Temp:  [98.1 F (36.7 C)-103.2 F (39.6 C)] 98.1 F (36.7 C) (12/23 0645) Pulse Rate:  [63-290] 112 (12/23 0645) Cardiac Rhythm: Sinus tachycardia (12/23 0410) Resp:  [0-47] 24 (12/23 0645) BP: (77-148)/(37-95) 99/48 (12/23 0630) SpO2:  [86 %-100 %] 100 % (12/23 0645) FiO2 (%):  [40 %-100 %] 40 % (12/23 0343) Weight:  [168 lb 14 oz (76.6 kg)-177 lb (80.3 kg)] 174 lb 2.6 oz (79 kg) (12/23 0500)  CBC:  Recent Labs  Lab 12/08/2017 1710 12/06/17 0610  WBC 27.9* 33.1*  NEUTROABS 24.3*  --   HGB 12.9* 12.1*  HCT 37.1* 35.7*  MCV 73.2* 75.0*  PLT 37* 27*    Basic Metabolic Panel:  Recent Labs  Lab 12/04/2017 1710 12/06/17 0610  NA 129* 135  K 4.3 4.3  CL 96* 105  CO2 19* 13*  GLUCOSE 110* 102*  BUN 43* 63*  CREATININE 2.00* 2.96*  CALCIUM 8.6* 7.5*  MG  --  2.3  PHOS  --  8.0*    Lipid Panel: No results found for: CHOL, TRIG, HDL, CHOLHDL, VLDL, LDLCALC HgbA1c: No results found for: HGBA1C Urine Drug  Screen:     Component Value Date/Time   LABOPIA NONE DETECTED 11/19/2017 2108   COCAINSCRNUR NONE DETECTED 12/02/2017 2108   LABBENZ NONE DETECTED 12/09/2017 2108   AMPHETMU POSITIVE (A) 12/03/2017 2108   THCU NONE DETECTED 11/19/2017 2108   LABBARB NONE DETECTED 11/25/2017 2108    Alcohol Level     Component Value Date/Time   ETH <11 01/11/2013 1226    IMAGING  Ct Head Wo Contrast 11/26/2017 IMPRESSION:  1. No acute intracranial hemorrhage.  2. Multiple low attenuating foci bilaterally most consistent with areas of infarct, subacute or acute. The pattern of infarcts in various vascular distribution suggest a central thromboembolic etiology. Further evaluation with MRI is recommended. Chest CT may provide additional evaluation of the central vasculature.  3. Air within the cavernous sinus as well as within the veins of the face and orbits of indeterminate etiology but may be related to IV access/injection.     Dg Chest Portable 1 View 11/17/2017 IMPRESSION:  1. Right IJ approach centra venous catheter in place with tip overlying the distal SVC.  2. Tip of the endotracheal tube positioned 3.5 cm above the carina.  3. Interval worsening in mild diffuse pulmonary interstitial edema with small right pleural effusion. Superimposed right basilar opacity may reflect atelectasis or infiltrate.     Dg Chest Portable 1 View 12/13/2017 IMPRESSION:  Chronic interstitial changes right base  with slightly more confluent opacification in the right base as early superimposed infection is possible. Mild stable cardiomegaly.     Transthoracic Echocardiogram  12/06/2017 Study Conclusions - Left ventricle: The cavity size was mildly dilated. Wall   thickness was normal. Systolic function was severely reduced. The   estimated ejection fraction was in the range of 20% to 25%.   Diffuse hypokinesis. The study is not technically sufficient to   allow evaluation of LV diastolic function. -  Aortic valve: There was severe regurgitation. - Mitral valve: A bioprosthesis was present. - Right ventricle: Systolic function was severely reduced. - Right atrium: The atrium was mildly dilated. - Tricuspid valve: A bioprosthesis was present.  Impressions:  - Severe global reduction in LV systolic function; mild LVE;   trileaflet aortic with possible perforated noncoronary cusp and   severe AI; s/p MVR with large oscillating density c/w vegetation;   some rocking motion posterior annulus (cannot R/O valve   dehiscence); s/p TVR; cannot R/O small oscillating density;   elevated mean gradient 10 mmHg); severely reduced RV function.     PHYSICAL EXAM Vitals:   12/06/17 0600 12/06/17 0615 12/06/17 0630 12/06/17 0645  BP: 99/60  (!) 99/48   Pulse: (!) 112 (!) 111 (!) 114 (!) 112  Resp: (!) 30 (!) 30 20 (!) 24  Temp: 98.2 F (36.8 C) 98.2 F (36.8 C) 98.1 F (36.7 C) 98.1 F (36.7 C)  TempSrc:      SpO2: 100% 100% 100% 100%  Weight:      Height:       Neurological exam : limited as patient is sedated and intubated eyes are closed but opens partially to sternal rub. Fundi not visualized Right gaze preference. Will not look to the left. Blinks to threat on the right but to a lesser extent on the left. Pupils equal reactive. Moves all 4 extremities to painful stimuli but right-sided more than the left upper extremity which moves less. Tone is normal reflexes are present. Plantars are downgoing.    ASSESSMENT/PLAN [name] is a 31 y.o. 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 He did not receive IV t-PA due to warfarin therapy.  Stroke:  Multiple infarcts felt secondary to septic emboli.  Resultant  altered mental status  CT head - Multiple low attenuating foci bilaterally most consistent with areas of infarct, subacute or acute. The pattern of infarcts in various vascular distribution suggest a central thromboembolic etiology.  MRI head - pending  MRA head - pending  Carotid Doppler - not ordered  2D Echo - EF 20-25%. Prosthetic valves consistent with vegetations - possible dehiscence.  LDL - not ordered  HgbA1c - pending  VTE prophylaxis - SCDs / warfarin No diet orders on file  warfarin daily prior to admission, now on No antithrombotic INR 6.54  Patient will be counseled to be compliant with his antithrombotic medications  Ongoing aggressive stroke risk factor management  Therapy recommendations:  pending  Disposition:  Pending  Hypertension  Blood pressure running low  Permissive hypertension (OK if < 220/120) but gradually normalize in 5-7 days  Long-term BP goal normotensive  Hyperlipidemia  Home  meds:No lipid lowering medications prior to admission  LDL not ordered, goal < 70   Other Stroke Risk Factors  Cigarette smoker - advised to stop smoking  Coronary artery disease  Prosthetic heart valves  IV drug abuse  Cardiomyopathy - EF 20-25%  Other Active Problems  Supra therapeutic warfarin level  Endocarditis MRSA bacteremia -> infectious disease following  Leukocytosis - 33.1  Tachycardia  Renal failure - 63 / 2.96  UDS positive for amphetamines  Thrombocytopenia - 27K  Hypotension - Levophed  Elevated troponin levels   Plan / Recommendations  The patient is not felt to be a candidate for surgery at this time - poor prognosis.  Hospital day # 1  I have personally examined this patient, reviewed notes, independently viewed imaging studies, participated in medical decision  making and plan of care.ROS completed by me personally and pertinent positives fully documented  I have made any additions or clarifications directly to the above note.  He has presented with altered mental status in the setting of bacterial endocarditis and has by cerebral infarcts on the CT scan. Likely has septic infarcts. Continue antibiotics. No need for anticoagulation unless atrial fibrillation is definitely verified in his past medical history. Follow INR no need to reverse unless there is bleeding in the brain or peripherally. No family available at the bedside per discussion. Discussed with Dr. Isaiah Serge This patient is critically ill and at significant risk of neurological worsening, death and care requires constant monitoring of vital signs, hemodynamics,respiratory and cardiac monitoring, extensive review of multiple databases, frequent neurological assessment, discussion with family, other specialists and medical decision making of high complexity.I have made any additions or clarifications directly to the above note.This critical care time does not reflect procedure time, or teaching time or supervisory time of PA/NP/Med Resident etc but could involve care discussion time.  I spent 30 minutes of neurocritical care time  in the care of  this patient.     Delia Heady, MD Medical Director [facility] Pager: [phone] 12/06/2017 12:38 PM   To contact Stroke Continuity provider, please refer to [email or web address]. After hours, contact General Neurology

## 2017-12-06 NOTE — Progress Notes (Signed)
CRITICAL VALUE ALERT  Critical Value:  INR 2.54 and Platelets 27  Date & Time Notied:  12/06/17 at 0734  Provider Notified: Dr. Isaiah Serge  Orders Received/Actions taken: no new verbal orders received

## 2017-12-06 NOTE — Consult Note (Addendum)
CARDIOLOGY CONSULT NOTE   Referring Physician: Jovita Kussmaul Primary Cardiologist: Dr. Herbie Baltimore Reason for Consultation: Elevated troponin  HPI:  Patient is a 31 y/o M with hx of mitral and tricuspid valve replacements at [facility] in 2016 after a bout of bacterial native valve endocarditis. At that time he was found to have low EF and had Vfib arrest. Since then he presented again to [place] with + MSSA blood cultures and a small mitral vegetation which was tx medically. Patient completed therapy and was presumably feeling well at Dr. Elissa Hefty appt in October. He presented today with AMS, Fevers of 103, Tachycardic, Lactic acid of 5. The family member at the bedside is not directly related to him, but rather a friends and he claims that the patient came to visit him for the holidays and was doing well until this morning when he said he was feeling warm and flushed and then became altered prompting him to be brought to the ER. On arrival patient was tachycardic and was found to have labs that were suggestive of multiorgan failure. Along with this a troponin was also found to be elevated at 11. Given his history, we opted to obtain a full TTE to r/o severe valvular vegetations.  Prior hx includes: pulmonary thrombectomy at the time of valve replacement as noted above, hx of IV drug use which was thought to be exacerbated by depression.  Review of Systems: Patient is unable to participate as he is obtunded with a family member at the bed side who was able to provide some information.     Cardiac Review of Systems: {x ] = yes [ ]  = no  Chest Pain [    ]  Resting SOB [   ] Exertional SOB  [  ]  Orthopnea [  ]   Pedal Edema [   ]    Palpitations [  ] Syncope  [  ]   Presyncope [   ]  General Review of Systems: [Y] = yes [  ]=no Constitional: recent weight change [  ]; anorexia [  ]; fatigue [  ]; nausea [  ]; night sweats [  ]; fever [  ]; or chills [  ];                                                                      Eyes : blurred vision [  ]; diplopia [   ]; vision changes [  ];  Amaurosis fugax[  ]; Resp: cough [  ];  wheezing[  ];  hemoptysis[  ];  PND [  ];  GI:  gallstones[  ], vomiting[  ];  dysphagia[  ]; melena[  ];  hematochezia [  ]; heartburn[  ];   GU: kidney stones [  ]; hematuria[  ];   dysuria [  ];  nocturia[  ]; incontinence [  ];             Skin: rash, swelling[  ];, hair loss[  ];  peripheral edema[  ];  or itching[  ]; Musculosketetal: myalgias[  ];  joint swelling[  ];  joint erythema[  ];  joint pain[  ];  back pain[  ];  Heme/Lymph: bruising[  ];  bleeding[  ];  anemia[  ];  Neuro: TIA[  ];  headaches[  ];  stroke[  ];  vertigo[  ];  seizures[  ];   paresthesias[  ];  difficulty walking[  ];  Psych:depression[  ]; anxiety[  ];  Endocrine: diabetes[  ];  thyroid dysfunction[  ];  Other:  Past Medical History:  Diagnosis Date  . Anemia   . Anxiety   . Chronic systolic dysfunction of right ventricle 10/2015   Following large R sided TV Vegetation related PE  . Daily headache   . Depression    Restarted Welbutrin  . Hepatitis C    "not treated for it yet" (08/12/2017)  . 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  Marland Kitchen History of cardiac arrest ~ 01/2016   "in ER; 20 minutes after I was given MSO4; I'm not allergic to MSO4; might have gotten a little too much"  . 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 IV drug abuse (HCC)    Hoping to start In-patient counseling  . Myocardial infarction (HCC)    "X 2 a couple months ago; I ddidn't even know about them" (08/12/2017)  . 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  . 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   Medications Prior to Admission  Medication Sig Dispense Refill  . acetaminophen (TYLENOL) 325 MG tablet Take 650 mg by mouth every 6 (six) hours as needed for mild pain or fever.    . Buprenorphine HCl-Naloxone HCl (SUBOXONE) 8-2 MG FILM Place 2 Film under the tongue daily. 14 each 0  . buPROPion (WELLBUTRIN SR) 150 MG 12 hr tablet Take 150 mg by mouth See admin instructions. Take 1 tablet (150MG ) by mouth daily for 1 week; then 1 tablet (150 mg) by mouth twice daily.    Marland Kitchen buPROPion (WELLBUTRIN) 75 MG tablet Take 1 tablet (75 mg total) by mouth 3 (three) times daily. 90 tablet 2  . carvedilol (COREG) 3.125 MG tablet Take 1 tablet (3.125 mg total) by mouth 2 (two) times daily with a meal. (Patient not taking: Reported on 09/18/2017) 30 tablet 0  . gabapentin (NEURONTIN) 600 MG tablet Take 0.5 tablets (300 mg total) by mouth 2 (two) times daily. 60 tablet 0  . Multiple Vitamins-Minerals (MULTIVITAMIN WITH MINERALS) tablet Take 1 tablet by mouth daily.    . nicotine (NICODERM CQ - DOSED IN MG/24 HOURS) 21 mg/24hr patch Place 1 patch (21 mg total) onto the skin daily. 28 patch 0  . warfarin (COUMADIN) 10 MG tablet Take 10 mg by mouth daily.     Marland Kitchen warfarin (COUMADIN) 2.5 MG tablet Take 2.5 mg by mouth daily.    Marland Kitchen warfarin (COUMADIN) 7.5 MG tablet Take 2 tablets (15 mg total) by mouth one time only at 6 PM. 20 tablet 0  . zolpidem (AMBIEN) 10 MG tablet Take 1 tablet (10 mg total) by mouth at bedtime as needed for sleep. 20 tablet 0   . fentaNYL      . pantoprazole (PROTONIX) IV  40 mg Intravenous QHS  . phenylephrine  Infusions: . sodium chloride    . sodium chloride 125 mL/hr at 12/06/17 0028  . DAPTOmycin (CUBICIN)  IV Stopped (12/02/2017 2029)  . fentaNYL infusion INTRAVENOUS 25 mcg/hr (12/06/17 0028)   Allergies  Allergen Reactions  . Penicillins Other (See Comments)    Childhood reaction  . Vancomycin     Other reaction(s): Other (See  Comments) Patient with generalized pruritic erythematous flat indistinct macules, worst on his neck, after completion of vancomycin 1.38 milligrams over 70 minutes. Patient given Benadryl. 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    Social History   Socioeconomic History  . Marital status: Single    Spouse name: Not on file  . Number of children: Not on file  . Years of education: Not on file  . Highest education level: Not on file  Social Needs  . Financial resource strain: Not on file  . Food insecurity - worry: Not on file  . Food insecurity - inability: Not on file  . Transportation needs - medical: Not on file  . Transportation needs - non-medical: Not on file  Occupational History  . Not on file  Tobacco Use  . Smoking status: Current Every Day Smoker    Packs/day: 0.25    Years: 13.00    Pack years: 3.25    Types: Cigarettes  . Smokeless tobacco: Current User    Types: Chew  Substance and Sexual Activity  . Alcohol use: No  . Drug use: Yes    Types: Methamphetamines, Marijuana, Other-see comments    Comment: 08/12/2017 "clean since 07/21/2017; no opiates for 5 yr"  . Sexual activity: Yes  Other Topics Concern  . Not on file  Social History Narrative  . Not on file   Family History  Problem Relation Age of Onset  . Hyperlipidemia Maternal Grandmother   . Hyperlipidemia Paternal Grandfather    PHYSICAL EXAM: Vitals:   12/06/17 0026 12/06/17 0030  BP:    Pulse: 63 65  Resp: (!) 32 (!) 32  Temp: (!) 102.4 F (39.1 C) (!) 102.4 F (39.1 C)  SpO2: 96% 98%    Intake/Output Summary (Last 24 hours) at 12/06/2017 0036 Last data filed at 11/29/2017 2330 Gross per 24 hour  Intake 3313 ml  Output 250 ml  Net 3063 ml   General:  Ill appearing HEENT: normal Neck: supple. no JVD. Carotids 2+ bilat; no bruits. No lymphadenopathy or thryomegaly appreciated. Cor: regular extremely tachycardia, difficult to appreciate murmurs Lungs: b/l  diminished at the bases Abdomen: soft, nontender, nondistended. No hepatosplenomegaly. No bruits or masses. Good bowel sounds. Extremities: petechial rash and peripheral emboli Neuro: alert & oriented x 3, cranial nerves grossly intact. moves all 4 extremities w/o difficulty. Affect pleasant.  ECG:  Results for orders placed or performed during the hospital encounter of 12/09/2017 (from the past 24 hour(s))  Comprehensive metabolic panel     Status: Abnormal   Collection Time: 11/21/2017  5:10 PM  Result Value Ref Range   Sodium 129 (L) 135 - 145 mmol/L   Potassium 4.3 3.5 - 5.1 mmol/L   Chloride 96 (L) 101 - 111 mmol/L   CO2 19 (L) 22 - 32 mmol/L   Glucose, Bld 110 (H) 65 - 99 mg/dL   BUN 43 (H) 6 - 20 mg/dL   Creatinine, Ser 1.612.00 (H) 0.61 - 1.24 mg/dL   Calcium 8.6 (L) 8.9 - 10.3 mg/dL   Total Protein 8.2 (H) 6.5 - 8.1 g/dL   Albumin 2.9 (  L) 3.5 - 5.0 g/dL   AST 147 (H) 15 - 41 U/L   ALT 52 17 - 63 U/L   Alkaline Phosphatase 133 (H) 38 - 126 U/L   Total Bilirubin 4.5 (H) 0.3 - 1.2 mg/dL   GFR calc non Af Amer 43 (L) >60 mL/min   GFR calc Af Amer 50 (L) >60 mL/min   Anion gap 14 5 - 15  CBC with Differential     Status: Abnormal   Collection Time: 11/14/2017  5:10 PM  Result Value Ref Range   WBC 27.9 (H) 4.0 - 10.5 K/uL   RBC 5.07 4.22 - 5.81 MIL/uL   Hemoglobin 12.9 (L) 13.0 - 17.0 g/dL   HCT 82.9 (L) 56.2 - 13.0 %   MCV 73.2 (L) 78.0 - 100.0 fL   MCH 25.4 (L) 26.0 - 34.0 pg   MCHC 34.8 30.0 - 36.0 g/dL   RDW 86.5 (H) 78.4 - 69.6 %   Platelets 37 (L) 150 - 400 K/uL   Neutrophils Relative % 85 %   Lymphocytes Relative 10 %   Monocytes Relative 3 %   Eosinophils Relative 0 %   Basophils Relative 0 %   Band Neutrophils 2 %   Metamyelocytes Relative 0 %   Myelocytes 0 %   Promyelocytes Absolute 0 %   Blasts 0 %   nRBC 0 0 /100 WBC   Neutro Abs 24.3 (H) 1.7 - 7.7 K/uL   Lymphs Abs 2.8 0.7 - 4.0 K/uL   Monocytes Absolute 0.8 0.1 - 1.0 K/uL   Eosinophils Absolute 0.0 0.0 -  0.7 K/uL   Basophils Absolute 0.0 0.0 - 0.1 K/uL   WBC Morphology TOXIC GRANULATION   Protime-INR     Status: Abnormal   Collection Time: 12/11/2017  5:10 PM  Result Value Ref Range   Prothrombin Time 50.0 (H) 11.4 - 15.2 seconds   INR 5.56 (HH)   I-Stat CG4 Lactic Acid, ED     Status: Abnormal   Collection Time: 12/09/2017  5:22 PM  Result Value Ref Range   Lactic Acid, Venous 4.88 (HH) 0.5 - 1.9 mmol/L   Comment NOTIFIED PHYSICIAN   I-Stat arterial blood gas, ED     Status: Abnormal   Collection Time: 12/06/2017  5:32 PM  Result Value Ref Range   pH, Arterial 7.516 (H) 7.350 - 7.450   pCO2 arterial 21.6 (L) 32.0 - 48.0 mmHg   pO2, Arterial 192.0 (H) 83.0 - 108.0 mmHg   Bicarbonate 17.2 (L) 20.0 - 28.0 mmol/L   TCO2 18 (L) 22 - 32 mmol/L   O2 Saturation 100.0 %   Acid-base deficit 3.0 (H) 0.0 - 2.0 mmol/L   Patient temperature 101.9 F    Collection site RADIAL, ALLEN'S TEST ACCEPTABLE    Drawn by RT    Sample type ARTERIAL    Comment NOTIFIED PHYSICIAN   Urinalysis, Routine w reflex microscopic     Status: Abnormal   Collection Time: 11/20/2017  5:50 PM  Result Value Ref Range   Color, Urine AMBER (A) YELLOW   APPearance CLOUDY (A) CLEAR   Specific Gravity, Urine 1.019 1.005 - 1.030   pH 5.0 5.0 - 8.0   Glucose, UA NEGATIVE NEGATIVE mg/dL   Hgb urine dipstick LARGE (A) NEGATIVE   Bilirubin Urine NEGATIVE NEGATIVE   Ketones, ur NEGATIVE NEGATIVE mg/dL   Protein, ur 295 (A) NEGATIVE mg/dL   Nitrite NEGATIVE NEGATIVE   Leukocytes, UA NEGATIVE NEGATIVE   RBC / HPF  TOO NUMEROUS TO COUNT 0 - 5 RBC/hpf   WBC, UA 6-30 0 - 5 WBC/hpf   Bacteria, UA RARE (A) NONE SEEN   Squamous Epithelial / LPF 0-5 (A) NONE SEEN   WBC Clumps PRESENT    Mucus PRESENT   I-Stat Troponin, ED (not at [facility])     Status: Abnormal   Collection Time: 11/25/2017  6:46 PM  Result Value Ref Range   Troponin i, poc 11.30 (HH) 0.00 - 0.08 ng/mL   Comment NOTIFIED PHYSICIAN    Comment 3          I-Stat CG4 Lactic  Acid, ED     Status: Abnormal   Collection Time: 11/19/2017  6:48 PM  Result Value Ref Range   Lactic Acid, Venous 5.19 (HH) 0.5 - 1.9 mmol/L   Comment NOTIFIED PHYSICIAN   Lactic acid, plasma     Status: Abnormal   Collection Time: 12/09/2017  9:07 PM  Result Value Ref Range   Lactic Acid, Venous 4.7 (HH) 0.5 - 1.9 mmol/L  Troponin I (q 6hr x 3)     Status: Abnormal   Collection Time: 12/12/2017  9:07 PM  Result Value Ref Range   Troponin I 9.06 (HH) <0.03 ng/mL  Procalcitonin - Baseline     Status: None   Collection Time: 11/29/2017  9:07 PM  Result Value Ref Range   Procalcitonin 57.08 ng/mL  Urine rapid drug screen (hosp performed)     Status: Abnormal   Collection Time: 12/04/2017  9:08 PM  Result Value Ref Range   Opiates NONE DETECTED NONE DETECTED   Cocaine NONE DETECTED NONE DETECTED   Benzodiazepines NONE DETECTED NONE DETECTED   Amphetamines POSITIVE (A) NONE DETECTED   Tetrahydrocannabinol NONE DETECTED NONE DETECTED   Barbiturates NONE DETECTED NONE DETECTED  I-Stat arterial blood gas, ED     Status: Abnormal   Collection Time: 11/14/2017  9:32 PM  Result Value Ref Range   pH, Arterial 7.402 7.350 - 7.450   pCO2 arterial 21.6 (L) 32.0 - 48.0 mmHg   pO2, Arterial 107.0 83.0 - 108.0 mmHg   Bicarbonate 13.1 (L) 20.0 - 28.0 mmol/L   TCO2 14 (L) 22 - 32 mmol/L   O2 Saturation 98.0 %   Acid-base deficit 9.0 (H) 0.0 - 2.0 mmol/L   Patient temperature 103.2 F    Collection site RADIAL, ALLEN'S TEST ACCEPTABLE    Drawn by RT    Sample type ARTERIAL    Comment NOTIFIED PHYSICIAN   I-STAT 3, arterial blood gas (G3+)     Status: Abnormal   Collection Time: 12/06/17 12:11 AM  Result Value Ref Range   pH, Arterial 7.093 (LL) 7.350 - 7.450   pCO2 arterial 56.6 (H) 32.0 - 48.0 mmHg   pO2, Arterial 503.0 (H) 83.0 - 108.0 mmHg   Bicarbonate 16.7 (L) 20.0 - 28.0 mmol/L   TCO2 18 (L) 22 - 32 mmol/L   O2 Saturation 100.0 %   Acid-base deficit 13.0 (H) 0.0 - 2.0 mmol/L   Patient  temperature 39.2 C    Collection site RADIAL, ALLEN'S TEST ACCEPTABLE    Sample type ARTERIAL    Comment NOTIFIED PHYSICIAN    Ct Head Wo Contrast  Result Date: 11/18/2017 CLINICAL DATA:  31 year old male with altered mental status. EXAM: CT HEAD WITHOUT CONTRAST TECHNIQUE: Contiguous axial images were obtained from the base of the skull through the vertex without intravenous contrast. COMPARISON:  Head CT dated 07/28/2017 FINDINGS: Brain: There is a 1 x  2 cm low attenuating area in the left cerebellum. A similar low attenuating area is noted in the right temporal lobe posteriorly as well as a focal low attenuating area in the gray-white matter junction of the right occipital lobe. Findings most consistent with areas of infarct, acute or subacute. Further evaluation with MRI is recommended. The ventricles and sulci appropriate size for patient's age. There is no acute intracranial hemorrhage. No midline shift. No extra-axial fluid collection. Vascular: No hyperdense vessel or unexpected calcification. Skull: Normal. Negative for fracture or focal lesion. Sinuses/Orbits: Extensive amount of venous gas in the facial musculature with involvement of the left masticator musculature, orbits, and air within the cavernous sinus. Other: None IMPRESSION: 1. No acute intracranial hemorrhage. 2. Multiple low attenuating foci bilaterally most consistent with areas of infarct, subacute or acute. The pattern of infarcts in various vascular distribution suggest a central thromboembolic etiology. Further evaluation with MRI is recommended. Chest CT may provide additional evaluation of the central vasculature. 3. Air within the cavernous sinus as well as within the veins of the face and orbits of indeterminate etiology but may be related to IV access/injection. These results were called by telephone at the time of interpretation on 01-04-2018 at 8:06 pm to Dr. Blane Ohara , who verbally acknowledged these results.  Electronically Signed   By: Elgie Collard M.D.   On: 01/04/2018 20:08   Dg Chest Portable 1 View  Result Date: 2018/01/04 CLINICAL DATA:  Initial evaluation for central line placement. EXAM: PORTABLE CHEST 1 VIEW COMPARISON:  Of prior radiograph from earlier same day. FINDINGS: Interval placement of right IJ approach central venous catheter with tip overlying the distal SVC. Patient now intubated with the tip of an endotracheal tube positioned 3.5 cm above the carina. Enteric tube courses in in the abdomen. Stable cardiomegaly with prosthetic valve. Mediastinal silhouette normal. Lungs mildly hypoinflated. Increased pulmonary vascular congestion and interstitial prominence as compared to previous, suggesting mildly progressive edema. Patchy right basilar opacity may reflect superimposed atelectasis or infiltrate. Small right pleural effusion. No pneumothorax. No acute osseus abnormality. IMPRESSION: 1. Right IJ approach centra venous catheter in place with tip overlying the distal SVC. 2. Tip of the endotracheal tube positioned 3.5 cm above the carina. 3. Interval worsening in mild diffuse pulmonary interstitial edema with small right pleural effusion. Superimposed right basilar opacity may reflect atelectasis or infiltrate. Electronically Signed   By: Rise Mu M.D.   On: 01-04-2018 23:23   Dg Chest Portable 1 View  Result Date: 2018-01-04 CLINICAL DATA:  Altered mental status and difficulty breathing. EXAM: PORTABLE CHEST 1 VIEW COMPARISON:  08/12/2017 and chest CT 07/30/2017 FINDINGS: Sternotomy wires and prosthetic heart valve unchanged. Lungs are adequately inflated with chronic interstitial changes in the right base. Slightly more confluent opacification over these interstitial changes of the right base as a superimposed early infectious process is possible. No evidence of effusion. Mild stable cardiomegaly. Remainder of the exam is unchanged. IMPRESSION: Chronic interstitial changes  right base with slightly more confluent opacification in the right base as early superimposed infection is possible. Mild stable cardiomegaly. Electronically Signed   By: Elberta Fortis M.D.   On: 2018/01/04 17:50   ASSESSMENT:  Mitral valve/possible tricuspid valve endocarditis  Severe Sepsis 2/2 to the above Troponin elevation most likely 2/2 to the above AKI Hx of prosthetic mitral and tricuspid valve with recent mitral valve endocarditis that was tx with antibiotics completed therapy in 08/2017.  Hx of IV drug use Valvular cardiomyopathy with  improved EF 45% in 09/25/2017   PLAN/DISCUSSION:  Stat transthoracic echo ordered and per my bedside review, there appeared to be a large mitral vegetation with significant mitral stenosis, moderate AR (present on prior echo due to lack of coaptation of leaflets)with no clearly visible vegetation and a tricuspid valve vegetation. Will need to review this with the prior images. EF is significantly diminished, close to about 20%. There did appear to be an aneurysmal septum in some view with anterior apical and inferior wall motion abnormalities, but in other view appeared like global hypokinesis.  CT scan of the head did show b./l focal areas of subacute/acute infarct.  Labs indicative of a consumptive process. Consider DIC workup. INR significant elevated.  Troponin elevation of 11 with EKG changes concerning for sinus tachycardia and ST elevations < 1 mm in V5-6 and lead II. He had normal coronaries on cath in 2016 prior to the valve surgery. Continue to trend trops. Would not initiate heparin given the coagulation disturbances that are already present. Trop elevation may also be related to possible showering affect with emboli in the coronaries however there is no clear evidence for ST-elevation MI. Would repeat another EKG if any change in hemodynamics or every morning. No indication for cardiac catheterization at this time as the risks would outweigh  the benefits and the overall outcome may remain unchanged with the persistent large vegetations, consumptive process in regards to the low platelets and elevated INR concerning for DIC and neurological compromise given the strokes noted on CT.   Can consider lung imaging if warranted as there was concern for tricuspid valve endocarditis as well. However, renal function is currently compromised.  He cleared his bcx from the prior antibiotic treatment from September of 2018. Unclear as to the exact valves he had placed.  Would recommend  CT surgery consultation to see if he is an operable candidate for valve repair.   UDS positive for amphetamines.   Unfortunately we have limited information as patient is obtunded and unable to provide much history.   Goal INR would be 3.  Overall extremely poor outcome.  Halina Andreas Cardiology fellow

## 2017-12-07 ENCOUNTER — Inpatient Hospital Stay (HOSPITAL_COMMUNITY): Payer: BLUE CROSS/BLUE SHIELD

## 2017-12-07 DIAGNOSIS — J9600 Acute respiratory failure, unspecified whether with hypoxia or hypercapnia: Secondary | ICD-10-CM

## 2017-12-07 LAB — BPAM FFP
Blood Product Expiration Date: 201812272359
Blood Product Expiration Date: 201812272359
ISSUE DATE / TIME: 201812230917
ISSUE DATE / TIME: 201812230917
Unit Type and Rh: 5100
Unit Type and Rh: 5100

## 2017-12-07 LAB — CBC
HCT: 32.2 % — ABNORMAL LOW (ref 39.0–52.0)
Hemoglobin: 11.5 g/dL — ABNORMAL LOW (ref 13.0–17.0)
MCH: 25.6 pg — AB (ref 26.0–34.0)
MCHC: 35.7 g/dL (ref 30.0–36.0)
MCV: 71.6 fL — ABNORMAL LOW (ref 78.0–100.0)
PLATELETS: 36 10*3/uL — AB (ref 150–400)
RBC: 4.5 MIL/uL (ref 4.22–5.81)
RDW: 15.7 % — AB (ref 11.5–15.5)
WBC: 22.7 10*3/uL — ABNORMAL HIGH (ref 4.0–10.5)

## 2017-12-07 LAB — BASIC METABOLIC PANEL
Anion gap: 14 (ref 5–15)
BUN: 93 mg/dL — ABNORMAL HIGH (ref 6–20)
CALCIUM: 6.6 mg/dL — AB (ref 8.9–10.3)
CO2: 24 mmol/L (ref 22–32)
CREATININE: 3.18 mg/dL — AB (ref 0.61–1.24)
Chloride: 98 mmol/L — ABNORMAL LOW (ref 101–111)
GFR calc Af Amer: 29 mL/min — ABNORMAL LOW (ref >60)
GFR, EST NON AFRICAN AMERICAN: 25 mL/min — AB (ref >60)
GLUCOSE: 185 mg/dL — AB (ref 65–99)
Potassium: 3 mmol/L — ABNORMAL LOW (ref 3.5–5.1)
Sodium: 136 mmol/L (ref 135–145)

## 2017-12-07 LAB — GLUCOSE, CAPILLARY
GLUCOSE-CAPILLARY: 141 mg/dL — AB (ref 65–99)
GLUCOSE-CAPILLARY: 99 mg/dL (ref 65–99)
Glucose-Capillary: 119 mg/dL — ABNORMAL HIGH (ref 65–99)
Glucose-Capillary: 121 mg/dL — ABNORMAL HIGH (ref 65–99)
Glucose-Capillary: 133 mg/dL — ABNORMAL HIGH (ref 65–99)
Glucose-Capillary: 155 mg/dL — ABNORMAL HIGH (ref 65–99)

## 2017-12-07 LAB — PREPARE FRESH FROZEN PLASMA
UNIT DIVISION: 0
UNIT DIVISION: 0

## 2017-12-07 LAB — LIPID PANEL
CHOLESTEROL: 36 mg/dL (ref 0–200)
Triglycerides: 78 mg/dL (ref <150)
VLDL: 16 mg/dL (ref 0–40)

## 2017-12-07 LAB — PROTIME-INR
INR: 1.83
Prothrombin Time: 21 seconds — ABNORMAL HIGH (ref 11.4–15.2)

## 2017-12-07 LAB — POCT I-STAT 3, ART BLOOD GAS (G3+)
BICARBONATE: 23.7 mmol/L (ref 20.0–28.0)
O2 Saturation: 96 %
PCO2 ART: 33.6 mmHg (ref 32.0–48.0)
PH ART: 7.457 — AB (ref 7.350–7.450)
PO2 ART: 78 mmHg — AB (ref 83.0–108.0)
Patient temperature: 99.1
TCO2: 25 mmol/L (ref 22–32)

## 2017-12-07 LAB — PHOSPHORUS: PHOSPHORUS: 4.5 mg/dL (ref 2.5–4.6)

## 2017-12-07 LAB — MAGNESIUM: MAGNESIUM: 2.4 mg/dL (ref 1.7–2.4)

## 2017-12-07 LAB — URINE CULTURE: Culture: <10000 — AB

## 2017-12-07 LAB — LACTIC ACID, PLASMA: LACTIC ACID, VENOUS: 2.4 mmol/L — AB (ref 0.5–1.9)

## 2017-12-07 LAB — HEMOGLOBIN A1C
HEMOGLOBIN A1C: 6 % — AB (ref 4.8–5.6)
MEAN PLASMA GLUCOSE: 125.5 mg/dL

## 2017-12-07 LAB — PROCALCITONIN

## 2017-12-07 LAB — CK: Total CK: 296 U/L (ref 49–397)

## 2017-12-07 MED ORDER — VITAL AF 1.2 CAL PO LIQD
1000.0000 mL | ORAL | Status: DC
  Administered 2017-12-07 – 2017-12-09 (×2): 1000 mL

## 2017-12-07 MED ORDER — PRO-STAT SUGAR FREE PO LIQD: 30.0000 mL | Freq: Two times a day (BID) | ORAL | Status: DC

## 2017-12-07 MED ORDER — NOREPINEPHRINE BITARTRATE 1 MG/ML IV SOLN
0.0000 ug/min | INTRAVENOUS | Status: DC
  Administered 2017-12-07 – 2017-12-09 (×3): 20 ug/min via INTRAVENOUS
  Filled 2017-12-07 (×3): qty 16

## 2017-12-07 MED ORDER — PANTOPRAZOLE SODIUM 40 MG PO PACK
40.0000 mg | PACK | Freq: Every day | ORAL | Status: DC
  Administered 2017-12-07 – 2017-12-08 (×2): 40 mg
  Filled 2017-12-07 (×2): qty 20

## 2017-12-07 MED ORDER — SODIUM CHLORIDE 0.9 % IV SOLN
800.0000 mg | INTRAVENOUS | Status: DC
  Administered 2017-12-07 – 2017-12-08 (×2): 800 mg via INTRAVENOUS
  Filled 2017-12-07 (×4): qty 16

## 2017-12-07 MED ORDER — POTASSIUM CHLORIDE 10 MEQ/50ML IV SOLN
10.0000 meq | INTRAVENOUS | Status: AC
  Administered 2017-12-07 (×6): 10 meq via INTRAVENOUS
  Filled 2017-12-07 (×7): qty 50

## 2017-12-07 NOTE — Progress Notes (Signed)
Patient ID: Norman Reed, male   DOB: 1986/12/14, 31 y.o.   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  Echocardiogram suggests involvement of his native aortic valve and bioprosthetic mitral and tricuspid valves.  He has a very large mitral valve vegetation and evidence of central nervous system emboli.  He has multiorgan failure.  Repeat blood cultures are negative at 24 hours.  I will continue daptomycin and rifampin for now.  I would not add an aminoglycoside given his worsening renal failure.         [name], MD [facility] [phone] pager   [phone] cell 12/07/2017, 10:37 AM

## 2017-12-07 NOTE — Plan of Care (Signed)
Patient has required increased presser support and cooling blanket to control hyperthermia.  With these interventions patent vitals have improved.

## 2017-12-07 NOTE — Progress Notes (Signed)
Progress Note  Patient Name: Norman Reed Date of Encounter: 12/07/2017  Primary Cardiologist:   Dr. Ellyn Hack   Subjective   [place].  Arouses.    Inpatient Medications    Scheduled Meds: . chlorhexidine gluconate (MEDLINE KIT)  15 mL Mouth Rinse BID  . mouth rinse  15 mL Mouth Rinse QID  . pantoprazole (PROTONIX) IV  40 mg Intravenous QHS   Continuous Infusions: . sodium chloride    . sodium chloride    . DAPTOmycin (CUBICIN)  IV Stopped (12/06/17 2020)  . fentaNYL infusion INTRAVENOUS 100 mcg/hr (12/07/17 0743)  . norepinephrine (LEVOPHED) Adult infusion 16 mcg/min (12/07/17 0733)  . rifampin (RIFADIN) IVPB Stopped (12/07/17 0715)  .  sodium bicarbonate  infusion 1000 mL 125 mL/hr at 12/07/17 0743  . vasopressin (PITRESSIN) infusion - *FOR SHOCK* Stopped (12/07/17 0116)   PRN Meds: sodium chloride, acetaminophen (TYLENOL) oral liquid 160 mg/5 mL, midazolam, midazolam   Vital Signs    Vitals:   12/07/17 0445 12/07/17 0500 12/07/17 0515 12/07/17 0530  BP:  102/67  99/61  Pulse: (!) 117 (!) 117 (!) 119 (!) 118  Resp: (!) 30 (!) 30 (!) 30 (!) 30  Temp: 99.9 F (37.7 C) 99.9 F (37.7 C) 100 F (37.8 C) 100 F (37.8 C)  TempSrc:      SpO2: 100% 100% 100% 100%  Weight:      Height:        Intake/Output Summary (Last 24 hours) at 12/07/2017 0759 Last data filed at 12/07/2017 0530 Gross per 24 hour  Intake 4588.28 ml  Output 770 ml  Net 3818.28 ml   Filed Weights   12/06/17 0026 12/06/17 0500 12/07/17 0115  Weight: 168 lb 14 oz (76.6 kg) 174 lb 2.6 oz (79 kg) 176 lb 5.9 oz (80 kg)    Telemetry    Sinus tach - Personally Reviewed  ECG    NA - Personally Reviewed  Physical Exam   GEN: Acute distress Neck: No  JVD Cardiac: RRR, distant heart sounds.  Respiratory: Clear  to auscultation bilaterally. GI:  Decreased bowel sounds MS: No  edema; No deformity. Neuro:  [place], minimally responsive   Labs    Chemistry Recent Labs  Lab  12/14/2017 1710 12/06/17 0610 12/07/17 0355  NA 129* 135 136  K 4.3 4.3 3.0*  CL 96* 105 98*  CO2 19* 13* 24  GLUCOSE 110* 102* 185*  BUN 43* 63* 93*  CREATININE 2.00* 2.96* 3.18*  CALCIUM 8.6* 7.5* 6.6*  PROT 8.2*  --   --   ALBUMIN 2.9*  --   --   AST 134*  --   --   ALT 52  --   --   ALKPHOS 133*  --   --   BILITOT 4.5*  --   --   GFRNONAA 43* 27* 25*  GFRAA 50* 31* 29*  ANIONGAP 14 17* 14     Hematology Recent Labs  Lab 12/06/2017 1710 12/06/17 0610 12/07/17 0355  WBC 27.9* 33.1* 22.7*  RBC 5.07 4.76 4.50  HGB 12.9* 12.1* 11.5*  HCT 37.1* 35.7* 32.2*  MCV 73.2* 75.0* 71.6*  MCH 25.4* 25.4* 25.6*  MCHC 34.8 33.9 35.7  RDW 15.6* 15.8* 15.7*  PLT 37* 27* 36*    Cardiac Enzymes Recent Labs  Lab 12/04/2017 2107 12/06/17 0446 12/06/17 1058 12/06/17 1641  TROPONINI 9.06* 10.33* 9.56* 10.60*    Recent Labs  Lab 12/14/2017 1846  TROPIPOC 11.30*     BNPNo results for  input(s): BNP, PROBNP in the last 168 hours.   DDimer No results for input(s): DDIMER in the last 168 hours.   Radiology    Ct Head Wo Contrast  Result Date: 11/25/2017 CLINICAL DATA:  31 year old male with altered mental status. EXAM: CT HEAD WITHOUT CONTRAST TECHNIQUE: Contiguous axial images were obtained from the base of the skull through the vertex without intravenous contrast. COMPARISON:  Head CT dated 07/28/2017 FINDINGS: Brain: There is a 1 x 2 cm low attenuating area in the left cerebellum. A similar low attenuating area is noted in the right temporal lobe posteriorly as well as a focal low attenuating area in the gray-white matter junction of the right occipital lobe. Findings most consistent with areas of infarct, acute or subacute. Further evaluation with MRI is recommended. The ventricles and sulci appropriate size for patient's age. There is no acute intracranial hemorrhage. No midline shift. No extra-axial fluid collection. Vascular: No hyperdense vessel or unexpected calcification.  Skull: Normal. Negative for fracture or focal lesion. Sinuses/Orbits: Extensive amount of venous gas in the facial musculature with involvement of the left masticator musculature, orbits, and air within the cavernous sinus. Other: None IMPRESSION: 1. No acute intracranial hemorrhage. 2. Multiple low attenuating foci bilaterally most consistent with areas of infarct, subacute or acute. The pattern of infarcts in various vascular distribution suggest a central thromboembolic etiology. Further evaluation with MRI is recommended. Chest CT may provide additional evaluation of the central vasculature. 3. Air within the cavernous sinus as well as within the veins of the face and orbits of indeterminate etiology but may be related to IV access/injection. These results were called by telephone at the time of interpretation on 12/13/2017 at 8:06 pm to Dr. Elnora Morrison , who verbally acknowledged these results. Electronically Signed   By: Anner Crete M.D.   On: 12/06/2017 20:08   Dg Chest Port 1 View  Result Date: 12/07/2017 CLINICAL DATA:  Followup ventilator support EXAM: PORTABLE CHEST 1 VIEW COMPARISON:  12/01/2017 FINDINGS: Endotracheal tube is 4 cm above the carina. Nasogastric tube enters the stomach. Right internal jugular central line tip is 4 cm above the right atrium. There is persistent pulmonary edema, perhaps minimally improved since the study of 2 days ago. There is a small amount of pleural fluid on the right. IMPRESSION: Lines and tubes well positioned. Persistent pulmonary edema, perhaps minimally improved. Small amount of pleural fluid on the right. Electronically Signed   By: Nelson Chimes M.D.   On: 12/07/2017 06:57   Dg Chest Portable 1 View  Result Date: 12/02/2017 CLINICAL DATA:  Initial evaluation for central line placement. EXAM: PORTABLE CHEST 1 VIEW COMPARISON:  Of prior radiograph from earlier same day. FINDINGS: Interval placement of right IJ approach central venous catheter with  tip overlying the distal SVC. Patient now intubated with the tip of an endotracheal tube positioned 3.5 cm above the carina. Enteric tube courses in in the abdomen. Stable cardiomegaly with prosthetic valve. Mediastinal silhouette normal. Lungs mildly hypoinflated. Increased pulmonary vascular congestion and interstitial prominence as compared to previous, suggesting mildly progressive edema. Patchy right basilar opacity may reflect superimposed atelectasis or infiltrate. Small right pleural effusion. No pneumothorax. No acute osseus abnormality. IMPRESSION: 1. Right IJ approach centra venous catheter in place with tip overlying the distal SVC. 2. Tip of the endotracheal tube positioned 3.5 cm above the carina. 3. Interval worsening in mild diffuse pulmonary interstitial edema with small right pleural effusion. Superimposed right basilar opacity may reflect atelectasis or infiltrate.  Electronically Signed   By: Jeannine Boga M.D.   On: 11/24/2017 23:23   Dg Chest Portable 1 View  Result Date: 11/28/2017 CLINICAL DATA:  Altered mental status and difficulty breathing. EXAM: PORTABLE CHEST 1 VIEW COMPARISON:  08/12/2017 and chest CT 07/30/2017 FINDINGS: Sternotomy wires and prosthetic heart valve unchanged. Lungs are adequately inflated with chronic interstitial changes in the right base. Slightly more confluent opacification over these interstitial changes of the right base as a superimposed early infectious process is possible. No evidence of effusion. Mild stable cardiomegaly. Remainder of the exam is unchanged. IMPRESSION: Chronic interstitial changes right base with slightly more confluent opacification in the right base as early superimposed infection is possible. Mild stable cardiomegaly. Electronically Signed   By: Marin Olp M.D.   On: 12/06/2017 17:50    Cardiac Studies   Echo 12/22  Study Conclusions  - Left ventricle: The cavity size was mildly dilated. Wall   thickness was normal.  Systolic function was severely reduced. The   estimated ejection fraction was in the range of 20% to 25%.   Diffuse hypokinesis. The study is not technically sufficient to   allow evaluation of LV diastolic function. - Aortic valve: There was severe regurgitation. - Mitral valve: A bioprosthesis was present. - Right ventricle: Systolic function was severely reduced. - Right atrium: The atrium was mildly dilated. - Tricuspid valve: A bioprosthesis was present.  Impressions:  - Severe global reduction in LV systolic function; mild LVE;   trileaflet aortic with possible perforated noncoronary cusp and   severe AI; s/p MVR with large oscillating density c/w vegetation;   some rocking motion posterior annulus (cannot R/O valve   dehiscence); s/p TVR; cannot R/O small oscillating density;   elevated mean gradient 10 mmHg); severely reduced RV function.   Patient Profile     31 y.o. 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  MRSA.  Evidence of septic emboli.   Antibiotics per ID.  Echo reviewed.  Very large MV vegetation.   High potential for continued emboli.  However, not a surgical candidate.  Supportive care but likely poor outcome.   SEPSIS:  Per primary team.    ELEVATED TROPONIN:   Prior normal coronaries.  No indication for cath.  This is likely secondary to the acute endocarditis and not obstructive disease.    AKI:   Creat increased this morning.  Follow.    CARDIOMYOPATHY:   EF 20%.  Continue supportive care.   For questions or updates, please contact [facility] Please consult [email or web address] for contact info under Cardiology/STEMI.   Signed, Minus Breeding, MD  12/07/2017, 7:59 AM

## 2017-12-07 NOTE — Progress Notes (Signed)
Initial Nutrition Assessment  DOCUMENTATION CODES:   Not applicable  INTERVENTION:    Vital AF 1.2 at 80 ml/h (1920 ml per day)   Provides 2304 kcal, 144 gm protein, 1557 ml free water daily  NUTRITION DIAGNOSIS:   Inadequate oral intake related to inability to eat as evidenced by NPO status.  GOAL:   Patient will meet greater than or equal to 90% of their needs  MONITOR:   Vent status, TF tolerance, Labs, I & O's  REASON FOR ASSESSMENT:   Consult(verbal) Enteral/tube feeding initiation and management  ASSESSMENT:   31 yo male with PMH of cardiac arrest, IV drug abuse, bacterial endocarditis, hepatitis C, anxiety, depression, cardiomyopathy, MVR & TVR at [facility] in 2016 who was admitted on 12/22 with endocarditis.  Received verbal MD Consult for TF initiation and management. Patient is currently intubated on ventilator support MV: 14.3 L/min Temp (24hrs), Avg:100.5 F (38.1 C), Min:98.2 F (36.8 C), Max:102.2 F (39 C)   Labs reviewed. Potassium 3 (L) CBG's: 121-133 Medications reviewed and include potassium chloride.  NUTRITION - FOCUSED PHYSICAL EXAM:    Most Recent Value  Orbital Region  No depletion  Upper Arm Region  No depletion  Thoracic and Lumbar Region  Unable to assess  Buccal Region  No depletion  Temple Region  No depletion  Clavicle Bone Region  No depletion  Clavicle and Acromion Bone Region  No depletion  Scapular Bone Region  Unable to assess  Dorsal Hand  No depletion  Patellar Region  No depletion  Anterior Thigh Region  No depletion  Posterior Calf Region  No depletion  Edema (RD Assessment)  None  Hair  Reviewed  Eyes  Unable to assess  Mouth  Unable to assess  Skin  Reviewed  Nails  Reviewed       Diet Order:  No diet orders on file  EDUCATION NEEDS:   No education needs have been identified at this time  Skin:  Skin Assessment: Reviewed RN Assessment  Last BM:  PTA  Height:   Ht Readings from Last 1 Encounters:   12/06/17 6' (1.829 m)    Weight:   Wt Readings from Last 1 Encounters:  12/07/17 176 lb 5.9 oz (80 kg)    Ideal Body Weight:  80.9 kg  BMI:  Body mass index is 23.92 kg/m.  Estimated Nutritional Needs:   Kcal:  2310  Protein:  120-145 gm  Fluid:  2.3 L   Joaquin Courts, RD, LDN, CNSC Pager [phone] After Hours Pager [phone]

## 2017-12-07 NOTE — Progress Notes (Signed)
STROKE TEAM PROGRESS NOTE   HISTORY OF PRESENT ILLNESS (per record) Norman Reed is an 31 y.o. male  PMH of IV Drug Abuse, MV and [facility]  Endocarditis, chronic systolic HF on Warfarin  presents altered mental status, fever and difficulty breathing. He was intubated for severe metabolic acidosis. Bedside ultrasound showed large mitral valve vegetation with mitral stenosis, smaller tricuspid valve vegetation. CT head was performed which showed multifocal acute and subacute strokes. Neurology was consulted for recommendations.  His INR is 5.6 on arrival. Also in renal failure. UDS positive for amphetamines. Not a candidate for tPA as outside window, cx in case of endocarditis and already on anticoagulation.    SUBJECTIVE (INTERVAL HISTORY) No family members present.His RN is at bedside The patient is intubated and sedated. No significant changes overnight. MRI scan not yet done  OBJECTIVE Temp:  [98.8 F (37.1 C)-102.2 F (39 C)] 101.7 F (38.7 C) (12/24 0915) Pulse Rate:  [61-129] 107 (12/24 1156) Cardiac Rhythm: Sinus tachycardia (12/24 0800) Resp:  [24-31] 31 (12/24 1156) BP: (78-122)/(44-78) 113/67 (12/24 1156) SpO2:  [97 %-100 %] 100 % (12/24 0915) FiO2 (%):  [40 %] 40 % (12/24 1156) Weight:  [176 lb 5.9 oz (80 kg)] 176 lb 5.9 oz (80 kg) (12/24 0115)  CBC:  Recent Labs  Lab 11/21/2017 1710 12/06/17 0610 12/07/17 0355  WBC 27.9* 33.1* 22.7*  NEUTROABS 24.3*  --   --   HGB 12.9* 12.1* 11.5*  HCT 37.1* 35.7* 32.2*  MCV 73.2* 75.0* 71.6*  PLT 37* 27* 36*    Basic Metabolic Panel:  Recent Labs  Lab 12/06/17 0610 12/07/17 0355  NA 135 136  K 4.3 3.0*  CL 105 98*  CO2 13* 24  GLUCOSE 102* 185*  BUN 63* 93*  CREATININE 2.96* 3.18*  CALCIUM 7.5* 6.6*  MG 2.3 2.4  PHOS 8.0* 4.5    Lipid Panel: No results found for: CHOL, TRIG, HDL, CHOLHDL, VLDL, LDLCALC HgbA1c: No results found for: HGBA1C Urine Drug Screen:     Component Value  Date/Time   LABOPIA NONE DETECTED 12/07/2017 2108   COCAINSCRNUR NONE DETECTED 12/13/2017 2108   LABBENZ NONE DETECTED 12/07/2017 2108   AMPHETMU POSITIVE (A) 12/04/2017 2108   THCU NONE DETECTED 12/08/2017 2108   LABBARB NONE DETECTED 11/25/2017 2108    Alcohol Level     Component Value Date/Time   ETH <11 01/11/2013 1226    IMAGING  Ct Head Wo Contrast 12/04/2017 IMPRESSION:  1. No acute intracranial hemorrhage.  2. Multiple low attenuating foci bilaterally most consistent with areas of infarct, subacute or acute. The pattern of infarcts in various vascular distribution suggest a central thromboembolic etiology. Further evaluation with MRI is recommended. Chest CT may provide additional evaluation of the central vasculature.  3. Air within the cavernous sinus as well as within the veins of the face and orbits of indeterminate etiology but may be related to IV access/injection.     Dg Chest Portable 1 View 12/02/2017 IMPRESSION:  1. Right IJ approach centra venous catheter in place with tip overlying the distal SVC.  2. Tip of the endotracheal tube positioned 3.5 cm above the carina.  3. Interval worsening in mild diffuse pulmonary interstitial edema with small right pleural effusion. Superimposed right basilar opacity may reflect atelectasis or infiltrate.     Dg Chest Portable 1 View 11/14/2017 IMPRESSION:  Chronic interstitial changes right base with slightly more confluent opacification in the right base as  early superimposed infection is possible. Mild stable cardiomegaly.     Transthoracic Echocardiogram  12/06/2017 Study Conclusions - Left ventricle: The cavity size was mildly dilated. Wall   thickness was normal. Systolic function was severely reduced. The   estimated ejection fraction was in the range of 20% to 25%.   Diffuse hypokinesis. The study is not technically sufficient to   allow evaluation of LV diastolic function. - Aortic valve: There was severe  regurgitation. - Mitral valve: A bioprosthesis was present. - Right ventricle: Systolic function was severely reduced. - Right atrium: The atrium was mildly dilated. - Tricuspid valve: A bioprosthesis was present.  Impressions:  - Severe global reduction in LV systolic function; mild LVE;   trileaflet aortic with possible perforated noncoronary cusp and   severe AI; s/p MVR with large oscillating density c/w vegetation;   some rocking motion posterior annulus (cannot R/O valve   dehiscence); s/p TVR; cannot R/O small oscillating density;   elevated mean gradient 10 mmHg); severely reduced RV function.     PHYSICAL EXAM Vitals:   12/07/17 0845 12/07/17 0900 12/07/17 0915 12/07/17 1156  BP:  (!) 103/51 (!) 104/57 113/67  Pulse: 61 61 (!) 120 (!) 107  Resp: (!) 30 (!) 30 (!) 30 (!) 31  Temp: (!) 101.5 F (38.6 C) (!) 101.7 F (38.7 C) (!) 101.7 F (38.7 C)   TempSrc:      SpO2: 100% 100% 100%   Weight:      Height:       Neurological exam : limited as patient is sedated and intubated eyes are closed but opens partially to sternal rub. Fundi not visualized Right gaze preference. Will not look to the left. Blinks to threat on the right but to a lesser extent on the left. Pupils equal reactive. Moves all 4 extremities to painful stimuli but right-sided more than the left upper extremity which moves less. Tone is normal reflexes are present. Plantars are downgoing.    ASSESSMENT/PLAN [name] is a 31 y.o. male with history of IV drug abuse, mitral and tricuspid valve replacements in 2016 at [facility] due to MSSA endocarditis, previous MI, history of cardiac arrest, history of hepatitis C, history of depression, history of pulmonary embolus, cardiomyopathy with chronic systolic heart failure on warfarin, presenting with altered mental status, fever, renal failure, supratherapeutic INR (5.56), and respiratory distress requiring intubation. He did not  receive IV t-PA due to warfarin therapy.  Stroke:  Multiple infarcts felt secondary to septic emboli.  Resultant  altered mental status  CT head - Multiple low attenuating foci bilaterally most consistent with areas of infarct, subacute or acute. The pattern of infarcts in various vascular distribution suggest a central thromboembolic etiology.  MRI head - pending  MRA head - pending  Carotid Doppler - not ordered  2D Echo - EF 20-25%. Prosthetic valves consistent with vegetations - possible dehiscence.  LDL - pending  HgbA1c - pending  VTE prophylaxis - SCDs / warfarin No diet orders on file  warfarin daily prior to admission, now on No antithrombotic INR 6.54  Patient will be counseled to be compliant with his antithrombotic medications  Ongoing aggressive stroke risk factor management  Therapy recommendations:  pending  Disposition:  Pending  Hypertension  Blood pressure running low  Permissive hypertension (OK if < 220/120) but gradually normalize in 5-7 days  Long-term BP goal normotensive  Hyperlipidemia  Home meds:No lipid lowering medications prior to admission  LDL not ordered,  goal < 70   Other Stroke Risk Factors  Cigarette smoker - advised to stop smoking  Coronary artery disease  Prosthetic heart valves  IV drug abuse  Cardiomyopathy - EF 20-25%  Other Active Problems  Supra therapeutic warfarin level  Endocarditis MRSA bacteremia -> infectious disease following  Leukocytosis - 33.1  Tachycardia  Renal failure - 63 / 2.96  UDS positive for amphetamines  Thrombocytopenia - 27K  Hypotension - Levophed  Elevated troponin levels   Plan / Recommendations  The patient is not felt to be a candidate for surgery at this time - poor prognosis.  MRI scan if patient is stable for transfer to the scanner.  No family available at the bedside for discussion.  Hospital day # 2  I have personally examined this patient, reviewed  notes, independently viewed imaging studies, participated in medical decision making and plan of care.ROS completed by me personally and pertinent positives fully documented  I have made any additions or clarifications directly to the above note.  He has presented with altered mental status in the setting of bacterial endocarditis and has by cerebral infarcts on the CT scan. Likely has septic infarcts. Continue antibiotics. No need for anticoagulation unless atrial fibrillation is definitely verified in his past medical history. Follow INR no need to reverse unless there is bleeding in the brain or peripherally. No family available at the bedside per discussion. Discussed with Dr. Isaiah Serge This patient is critically ill and at significant risk of neurological worsening, death and care requires constant monitoring of vital signs, hemodynamics,respiratory and cardiac monitoring, extensive review of multiple databases, frequent neurological assessment, discussion with family, other specialists and medical decision making of high complexity.I have made any additions or clarifications directly to the above note.This critical care time does not reflect procedure time, or teaching time or supervisory time of PA/NP/Med Resident etc but could involve care discussion time.  I spent 30 minutes of neurocritical care time  in the care of  this patient.     Delia Heady, MD Medical Director [facility] Pager: [phone] 12/07/2017 12:56 PM   To contact Stroke Continuity provider, please refer to [email or web address]. After hours, contact General Neurology

## 2017-12-07 NOTE — Progress Notes (Signed)
PULMONARY / CRITICAL CARE MEDICINE   Name: Norman Reed MRN: [id number] DOB: 11-21-1986    ADMISSION DATE:  12/09/2017 CONSULTATION DATE:  11/27/2017  REFERRING MD:  Dr. Rejeana Brock   CHIEF COMPLAINT: AMS   HISTORY OF PRESENT ILLNESS:   31 year old male with PMH of IV Drug Abuse, MV and TV replacement in 2016 at [facility] to ED on 12/22 with fever, AMS, and hypoxia. HR 147, WBC 27.9, Tmax 103.2, Troponin 9.06, LA 4.7, INR 5.56. CXR with chronic interstitial changes with slightly increased right basilar opacification. CT Head with multiple low attenuating foci. Cardiology Consulted. PCCM asked to admit.   Patient was intubated for hypoxia and lethargy and admitted to the ICU. Neurology and CT surgery consulted.   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  PAST SURGICAL HISTORY: He  has a past surgical history that includes TEE without cardioversion (N/A, 07/30/2017); Cardiac valve replacement (11/2015); Knee arthroscopy (Left, ~ 2009); transthoracic echocardiogram (10/2015); transthoracic echocardiogram (01/2016); Cardiac catheterization (11/2015); and Cardioversion (11/2015).  Allergies  Allergen Reactions  . Penicillins Other (See Comments)    Childhood reaction  . Vancomycin     Other reaction(s): Other (See Comments) Patient with generalized pruritic erythematous flat indistinct macules, worst on his neck, after completion of vancomycin 1.38 milligrams over 70 minutes. Patient given Benadryl. Suspect "red man" syndrome, consider close  observation with next dose.     No current facility-administered medications on file prior to encounter.    Current Outpatient Medications on File Prior to Encounter  Medication Sig  . acetaminophen (TYLENOL) 325 MG tablet Take 650 mg by mouth every 6 (six) hours as needed for mild pain or fever.  . Buprenorphine HCl-Naloxone HCl (SUBOXONE) 8-2 MG FILM Place 2 Film under the tongue daily.  Marland Kitchen buPROPion (WELLBUTRIN SR) 150 MG 12 hr tablet Take 150 mg by mouth See admin instructions. Take 1 tablet (150MG ) by mouth daily for 1 week; then 1 tablet (150 mg) by mouth twice daily.  Marland Kitchen buPROPion (WELLBUTRIN) 75 MG tablet Take 1 tablet (75 mg total) by mouth 3 (three) times daily.  . carvedilol (COREG) 3.125 MG tablet Take 1 tablet (3.125 mg total) by mouth 2 (two) times daily with a meal. (Patient not taking: Reported on 09/18/2017)  . gabapentin (NEURONTIN) 600 MG tablet Take 0.5 tablets (300 mg total) by mouth 2 (two) times daily.  . Multiple Vitamins-Minerals (MULTIVITAMIN WITH MINERALS) tablet Take 1 tablet by mouth daily.  . nicotine (NICODERM CQ - DOSED IN MG/24 HOURS) 21 mg/24hr patch Place 1 patch (21 mg total) onto the skin daily.  Marland Kitchen warfarin (COUMADIN) 10 MG tablet Take 10 mg by mouth daily.   Marland Kitchen warfarin (COUMADIN) 2.5 MG tablet Take 2.5 mg by mouth daily.  Marland Kitchen warfarin (COUMADIN) 7.5 MG tablet Take 2 tablets (15 mg total) by mouth one time only at 6 PM.  . zolpidem (AMBIEN) 10 MG tablet Take 1 tablet (10 mg total) by mouth at bedtime as needed for sleep.    FAMILY HISTORY:  His indicated that his mother is alive. He indicated that his father is alive. He indicated that the  status of his maternal grandmother is unknown. He indicated that the status of his paternal grandfather is unknown.   SOCIAL HISTORY: He  reports that he has been smoking cigarettes.  He has a 3.25 pack-year smoking history. His smokeless tobacco use includes chew. He reports that he uses drugs. Drugs: Methamphetamines,  Marijuana, and Other-see comments. He reports that he does not drink alcohol.  REVIEW OF SYSTEMS:   Unable to review as patient is encephalopathic   SUBJECTIVE:  Still on levophed drip  VITAL SIGNS: BP 115/66   Pulse (!) 119   Temp 100 F (37.8 C)   Resp (!) 30   Ht 6' (1.829 m)   Wt 176 lb 5.9 oz (80 kg)   SpO2 100%   BMI 23.92 kg/m   HEMODYNAMICS: CVP:  [14 mmHg-21 mmHg] 18 mmHg  VENTILATOR SETTINGS: Vent Mode: PCV FiO2 (%):  [40 %] 40 % Set Rate:  [30 bmp] 30 bmp Vt Set:  [20 mL] 20 mL PEEP:  [5 cmH20] 5 cmH20 Plateau Pressure:  [22 cmH20-24 cmH20] 23 cmH20  INTAKE / OUTPUT: I/O last 3 completed shifts: In: 5494.2 [I.V.:4123.2; Blood:468; Other:18; NG/GT:70; IV Piggyback:815] Out: 1070 [Urine:970; Emesis/NG output:100]  PHYSICAL EXAMINATION: Gen:      No acute distress, ill appearing HEENT:  EOMI, sclera anicteric Neck:     No masses; no thyromegaly Lungs:    Clear to auscultation bilaterally; normal respiratory effort CV:         Regular rate and rhythm; no murmurs Abd:      + bowel sounds; soft, non-tender; no palpable masses, no distension Ext:    No edema; adequate peripheral perfusion Skin:      Warm and dry; no rash Neuro: Sedated, intubated  LABS:  BMET Recent Labs  Lab 11/22/2017 1710 12/06/17 0610 12/07/17 0355  NA 129* 135 136  K 4.3 4.3 3.0*  CL 96* 105 98*  CO2 19* 13* 24  BUN 43* 63* 93*  CREATININE 2.00* 2.96* 3.18*  GLUCOSE 110* 102* 185*    Electrolytes Recent Labs  Lab 11/18/2017 1710 12/06/17 0610 12/07/17 0355  CALCIUM 8.6* 7.5* 6.6*  MG  --  2.3 2.4  PHOS  --  8.0* 4.5    CBC Recent Labs  Lab 11/22/2017 1710 12/06/17 0610 12/07/17 0355  WBC 27.9* 33.1* 22.7*  HGB 12.9* 12.1* 11.5*  HCT 37.1* 35.7* 32.2*  PLT 37* 27* 36*    Coag's Recent Labs  Lab 11/17/2017 1710 12/06/17 0610  INR 5.56* 6.54*    Sepsis Markers Recent Labs  Lab 12/04/2017 2107 12/06/17 0146 12/06/17 0446 12/06/17 0447 12/07/17 0355   LATICACIDVEN 4.7* 6.5*  --  7.5*  --   PROCALCITON 57.08  --  95.18  --  >150.00    ABG Recent Labs  Lab 12/06/17 0011 12/06/17 0401 12/07/17 0426  PHART 7.093* 7.240* 7.457*  PCO2ART 56.6* 28.2* 33.6  PO2ART 503.0* 188.0* 78.0*    Liver Enzymes Recent Labs  Lab 12/02/2017 1710  AST 134*  ALT 52  ALKPHOS 133*  BILITOT 4.5*  ALBUMIN 2.9*    Cardiac Enzymes Recent Labs  Lab 12/06/17 0446 12/06/17 1058 12/06/17 1641  TROPONINI 10.33* 9.56* 10.60*    Glucose Recent Labs  Lab 12/06/17 1106 12/06/17 1515 12/06/17 1919 12/06/17 2342 12/07/17 0323 12/07/17 0710  GLUCAP 158* 102* 108* 76 155* 121*    Imaging Dg Chest Port 1 View  Result Date: 12/07/2017 CLINICAL DATA:  Followup ventilator support EXAM: PORTABLE CHEST 1 VIEW  COMPARISON:  12/13/2017 FINDINGS: Endotracheal tube is 4 cm above the carina. Nasogastric tube enters the stomach. Right internal jugular central line tip is 4 cm above the right atrium. There is persistent pulmonary edema, perhaps minimally improved since the study of 2 days ago. There is a small amount of pleural fluid on the right. IMPRESSION: Lines and tubes well positioned. Persistent pulmonary edema, perhaps minimally improved. Small amount of pleural fluid on the right. Electronically Signed   By: Paulina Fusi M.D.   On: 12/07/2017 06:57    STUDIES:  CXR 12/22 > Chronic interstitial changes right base with slightly more confluent opacification in the right base as early superimposed infection is possible.Mild stable cardiomegaly CT Head 12/22 > 1. No acute intracranial hemorrhage. 2. Multiple low attenuating foci bilaterally most consistent with areas of infarct, subacute or acute. The pattern of infarcts in various vascular distribution suggest a central thromboembolic etiology. Further evaluation with MRI is recommended. Chest CT may provide additional evaluation of the central vasculature. 3. Air within the cavernous sinus as well as  within the veins of the face and orbits of indeterminate etiology but may be related to IV access/injection. Echo 12/23 >> EF 25%, diffuse hypokinesis with severe regurgitation.  Large vegetation on mitral valve.  Severely reduced RV function.  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 Presents with AMS, CT Head with low attenuating foci bilaterally. ECHO with large vegetation of prosthetic mitral valve and mitral stenosis with EF 20%. Intubated.   ASSESSMENT / PLAN:  PULMONARY A: Acute Hypoxic Respiratory Failure CXR with chronic interstitial changes right base more confluent opacification  P:   Continue full vent support Follow CXR, ABG  CARDIOVASCULAR A:  Septic Shock  Endocarditis s/p MV and TV Bioprostetic replacement in 2016 Chronic Systolic Heart Failure (EF 45-50, aortic valve has some calcification and is mildly leaky)  Troponin 11.30 P:  Appreciate recommendations from cardiology and cardiothoracic surgery Hold home coumadin/Anticogulation per neurology as below  Not sure why he needs to be on anticoagulation as the valves are bioprosthetic  RENAL A:   Acute Kidney Injury  Crt 2 >  Anion Gap Metabolic Acidosis  LA 5.19 >   P:   Stop bicarb drip Follow urine output and Cr  GASTROINTESTINAL A:   SUP  P:   Start tube feeds PPI  HEMATOLOGIC A:   Supratherapeutic INR  On Coumadin as an outpatient. 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 PRN versed, wean fentanyl prn  FAMILY  - Updates: Unable to reach family. Attempted to call  numbers listed on Facesheet for mother and brother. Awaiting response from family. Prognosis is dismal. - Inter-disciplinary family meet or Palliative Care meeting due by: 12/30     The patient is critically ill with multiple organ system failure and requires high complexity decision making for assessment and support, frequent evaluation and titration of therapies, advanced monitoring, review of radiographic studies and interpretation of complex data.   Critical Care Time devoted to patient care services, exclusive of separately billable procedures, described in this note is 35 minutes.   Chilton GreathousePraveen Kabella Cassidy MD [facility] Pager [phone] If no answer or after 3pm call: [phone] 12/07/2017, 8:22 AM

## 2017-12-07 NOTE — Progress Notes (Signed)
PHARMACY NOTE:  ANTIMICROBIAL RENAL DOSAGE ADJUSTMENT  Current antimicrobial regimen includes a mismatch between antimicrobial dosage and estimated renal function.  As per policy approved by the Pharmacy & Therapeutics and Medical Executive Committees, the antimicrobial dosage will be adjusted accordingly.  Current antimicrobial dosage:  Daptomycin 8 mg/kg every 24 hours   Indication: Endocarditis   Renal Function:  Estimated Creatinine Clearance: 37.6 mL/min (A) (by C-G formula based on SCr of 3.18 mg/dL (H)). []      On intermittent HD, scheduled: []      On CRRT    Antimicrobial dosage has been changed to: Daptomycin 10 mg/kg (800 mg) every 24 hours due to severity of infection including multi-valve involvement and septic emboli      Thank you for allowing pharmacy to be a part of this patient's care.  Della Goo, PharmD, BCPS PGY2 Infectious Diseases Pharmacy Resident Pager: [phone]  12/07/2017 2:52 PM

## 2017-12-07 NOTE — Progress Notes (Signed)
[facility]  [facility] Infusion Coordinator will follow pt with ID team to support home IVABX at DC as ordered.  If patient discharges after hours, please call [phone].   Sedalia Muta 12/07/2017, 11:37 AM

## 2017-12-08 ENCOUNTER — Inpatient Hospital Stay (HOSPITAL_COMMUNITY): Payer: BLUE CROSS/BLUE SHIELD

## 2017-12-08 DIAGNOSIS — I6340 Cerebral infarction due to embolism of unspecified cerebral artery: Secondary | ICD-10-CM

## 2017-12-08 LAB — BASIC METABOLIC PANEL
Anion gap: 12 (ref 5–15)
BUN: 97 mg/dL — AB (ref 6–20)
CHLORIDE: 97 mmol/L — AB (ref 101–111)
CO2: 27 mmol/L (ref 22–32)
CREATININE: 2.82 mg/dL — AB (ref 0.61–1.24)
Calcium: 7.1 mg/dL — ABNORMAL LOW (ref 8.9–10.3)
GFR, EST AFRICAN AMERICAN: 33 mL/min — AB (ref >60)
GFR, EST NON AFRICAN AMERICAN: 29 mL/min — AB (ref >60)
Glucose, Bld: 128 mg/dL — ABNORMAL HIGH (ref 65–99)
Potassium: 3.5 mmol/L (ref 3.5–5.1)
SODIUM: 136 mmol/L (ref 135–145)

## 2017-12-08 LAB — CBC
HCT: 33.8 % — ABNORMAL LOW (ref 39.0–52.0)
HEMOGLOBIN: 11.6 g/dL — AB (ref 13.0–17.0)
MCH: 24.8 pg — AB (ref 26.0–34.0)
MCHC: 34.3 g/dL (ref 30.0–36.0)
MCV: 72.4 fL — ABNORMAL LOW (ref 78.0–100.0)
PLATELETS: 52 10*3/uL — AB (ref 150–400)
RBC: 4.67 MIL/uL (ref 4.22–5.81)
RDW: 15.9 % — ABNORMAL HIGH (ref 11.5–15.5)
WBC: 21.3 10*3/uL — ABNORMAL HIGH (ref 4.0–10.5)

## 2017-12-08 LAB — PROTIME-INR
INR: 1.68
PROTHROMBIN TIME: 19.7 s — AB (ref 11.4–15.2)

## 2017-12-08 LAB — CULTURE, RESPIRATORY W GRAM STAIN

## 2017-12-08 LAB — CULTURE, RESPIRATORY

## 2017-12-08 LAB — GLUCOSE, CAPILLARY
GLUCOSE-CAPILLARY: 128 mg/dL — AB (ref 65–99)
Glucose-Capillary: 113 mg/dL — ABNORMAL HIGH (ref 65–99)

## 2017-12-08 LAB — MAGNESIUM: Magnesium: 2.7 mg/dL — ABNORMAL HIGH (ref 1.7–2.4)

## 2017-12-08 LAB — PHOSPHORUS: PHOSPHORUS: 5.6 mg/dL — AB (ref 2.5–4.6)

## 2017-12-08 NOTE — Progress Notes (Signed)
Progress Note  Patient Name: Norman Reed Date of Encounter: 12/08/2017  Primary Cardiologist: Dr Ellyn Hack  Subjective   Intubated and sedated; opens eyes; does not follow commands  Inpatient Medications    Scheduled Meds: . chlorhexidine gluconate (MEDLINE KIT)  15 mL Mouth Rinse BID  . mouth rinse  15 mL Mouth Rinse QID  . pantoprazole sodium  40 mg Per Tube QHS   Continuous Infusions: . sodium chloride 250 mL (12/07/17 2100)  . sodium chloride Stopped (12/07/17 2059)  . DAPTOmycin (CUBICIN)  IV Stopped (12/07/17 2238)  . feeding supplement (VITAL AF 1.2 CAL) 1,000 mL (12/07/17 2046)  . fentaNYL infusion INTRAVENOUS 200 mcg/hr (12/07/17 2237)  . norepinephrine (LEVOPHED) Adult infusion 20 mcg/min (12/07/17 2239)  . rifampin (RIFADIN) IVPB Stopped (12/08/17 0532)  . vasopressin (PITRESSIN) infusion - *FOR SHOCK* Stopped (12/07/17 0116)   PRN Meds: sodium chloride, acetaminophen (TYLENOL) oral liquid 160 mg/5 mL, midazolam, midazolam   Vital Signs    Vitals:   12/08/17 0324 12/08/17 0330 12/08/17 0400 12/08/17 0500  BP: 115/75 116/65 110/60 108/60  Pulse: 84 85 86 88  Resp: (!) 31 (!) 30 (!) 30 (!) 30  Temp:  100 F (37.8 C) (!) 100.4 F (38 C) (!) 100.8 F (38.2 C)  TempSrc:      SpO2: 94% 93% 98% 98%  Weight:    193 lb 9 oz (87.8 kg)  Height:        Intake/Output Summary (Last 24 hours) at 12/08/2017 0641 Last data filed at 12/08/2017 0532 Gross per 24 hour  Intake 5319.05 ml  Output 1675 ml  Net 3644.05 ml   Filed Weights   12/06/17 0500 12/07/17 0115 12/08/17 0500  Weight: 174 lb 2.6 oz (79 kg) 176 lb 5.9 oz (80 kg) 193 lb 9 oz (87.8 kg)    Telemetry    Sinus tachy with mobitz 1 - Personally Reviewed   Physical Exam   GEN: Intubated and sedated; opens eyes Neck: No JVD Cardiac: tachycardic and irregular Respiratory: CTA anteriorly GI: soft, no masses MS: 1+ ankle edema Neuro:  opens eyes; does not respond to questions   Labs      Chemistry Recent Labs  Lab 11/20/2017 1710 12/06/17 0610 12/07/17 0355 12/08/17 0523  NA 129* 135 136 136  K 4.3 4.3 3.0* 3.5  CL 96* 105 98* 97*  CO2 19* 13* 24 27  GLUCOSE 110* 102* 185* 128*  BUN 43* 63* 93* 97*  CREATININE 2.00* 2.96* 3.18* 2.82*  CALCIUM 8.6* 7.5* 6.6* 7.1*  PROT 8.2*  --   --   --   ALBUMIN 2.9*  --   --   --   AST 134*  --   --   --   ALT 52  --   --   --   ALKPHOS 133*  --   --   --   BILITOT 4.5*  --   --   --   GFRNONAA 43* 27* 25* 29*  GFRAA 50* 31* 29* 33*  ANIONGAP 14 17* 14 12     Hematology Recent Labs  Lab 12/06/17 0610 12/07/17 0355 12/08/17 0523  WBC 33.1* 22.7* 21.3*  RBC 4.76 4.50 4.67  HGB 12.1* 11.5* 11.6*  HCT 35.7* 32.2* 33.8*  MCV 75.0* 71.6* 72.4*  MCH 25.4* 25.6* 24.8*  MCHC 33.9 35.7 34.3  RDW 15.8* 15.7* 15.9*  PLT 27* 36* 52*    Cardiac Enzymes Recent Labs  Lab 11/25/2017 2107 12/06/17 0446 12/06/17 1058  12/06/17 1641  TROPONINI 9.06* 10.33* 9.56* 10.60*    Recent Labs  Lab 11/24/2017 1846  TROPIPOC 11.30*      Radiology    Dg Chest Port 1 View  Result Date: 12/07/2017 CLINICAL DATA:  Followup ventilator support EXAM: PORTABLE CHEST 1 VIEW COMPARISON:  12/07/2017 FINDINGS: Endotracheal tube is 4 cm above the carina. Nasogastric tube enters the stomach. Right internal jugular central line tip is 4 cm above the right atrium. There is persistent pulmonary edema, perhaps minimally improved since the study of 2 days ago. There is a small amount of pleural fluid on the right. IMPRESSION: Lines and tubes well positioned. Persistent pulmonary edema, perhaps minimally improved. Small amount of pleural fluid on the right. Electronically Signed   By: Nelson Chimes M.D.   On: 12/07/2017 06:57    Patient Profile     31 y.o. 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 Pt remains critically ill with embolic CVA, sepsis, acute renal failure and thrombocytopenia. Continue antibiotics. Note, mobitz 1 noted on telemetry raises concern for abscess. No intervention indicated given pt not a surgical candidate (previously evaluated by Dr Roxan Hockey). Prognosis poor.  2 elevated troponin-possibly related to embolic event to coronaries (ECG showed some degree of inferolateral ST elevation); however, not a candidate for cath.  3 NICM-cannot add beta blocker or ARB as pt remains on pressors. 4 Acute renal failure-possibly related to sepsis/hypotension vs renal emboli. Repeat BMET in AM. 5 coagulopathy-improving; valves are bioprosthetic; no need for coumadin. 6 Thrombocytopenia-improving; follow. 7 H/O IVDA 8 s/p embolic CVA  30 min prolonged physician time (7:45-8:15 AM) For questions or updates, please contact [place] Please consult [email or web address] for contact info under Cardiology/STEMI.      Signed, Kirk Ruths, MD  12/08/2017, 6:41 AM

## 2017-12-08 NOTE — Plan of Care (Signed)
  Interdisciplinary Goals of Care Family Meeting   Date carried out:: 12/08/2017  Location of the meeting: Conference room  Member's involved: Physician, Bedside Registered Nurse and Family Member or next of kin  Durable Power of Attorney or acting medical decision maker: Father, Mother    Discussion: We discussed goals of care for [employer] .  His prognosis is extremely poor as he is not a surgical candidate.  If he were to continue current level of care he will likely need a trach as I do not anticipate him being weaned off anytime soon.  His parents have made it clear that they have been preparing for this for a long time.  They do not want a trach or continued support.  We have made him DNR.  Plan on terminal extubation in the next day or 2 after the rest of the family has had a chance to visit.  Code status: Full DNR  Disposition: Continue current acute care. Anticipate withdrawal in next 1-2 days  Time spent for the meeting: 30 mins  Arian Mcquitty 12/08/2017, 11:06 AM

## 2017-12-08 NOTE — Progress Notes (Signed)
STROKE TEAM PROGRESS NOTE   HISTORY OF PRESENT ILLNESS (per record) Norman Reed is an 31 y.o. male  PMH of IV Drug Abuse, MV and TV replacement in 2016 at [facility] due to MSSA  Endocarditis, chronic systolic HF on Warfarin  presents altered mental status, fever and difficulty breathing. He was intubated for severe metabolic acidosis. Bedside ultrasound showed large mitral valve vegetation with mitral stenosis, smaller tricuspid valve vegetation. CT head was performed which showed multifocal acute and subacute strokes. Neurology was consulted for recommendations.  His INR is 5.6 on arrival. Also in renal failure. UDS positive for amphetamines. Not a candidate for tPA as outside window, cx in case of endocarditis and already on anticoagulation.    SUBJECTIVE (INTERVAL HISTORY) No family members present.His RN is at bedside The patient is intubated and sedated. No significant changes overnight. Dr Vaughan Browner has met with his mother and brother and family is leaning towards palliative care and terminal extubation in next few days  OBJECTIVE Temp:  [98.6 F (37 C)-101.1 F (38.4 C)] 101.1 F (38.4 C) (12/25 0900) Pulse Rate:  [53-116] 91 (12/25 0900) Cardiac Rhythm: Sinus tachycardia (12/25 0800) Resp:  [22-32] 23 (12/25 0900) BP: (101-134)/(55-109) 119/60 (12/25 0900) SpO2:  [91 %-100 %] 98 % (12/25 1119) FiO2 (%):  [40 %] 40 % (12/25 1119) Weight:  [193 lb 9 oz (87.8 kg)] 193 lb 9 oz (87.8 kg) (12/25 0500)  CBC:  Recent Labs  Lab 11/18/2017 1710  12/07/17 0355 12/08/17 0523  WBC 27.9*   < > 22.7* 21.3*  NEUTROABS 24.3*  --   --   --   HGB 12.9*   < > 11.5* 11.6*  HCT 37.1*   < > 32.2* 33.8*  MCV 73.2*   < > 71.6* 72.4*  PLT 37*   < > 36* 52*   < > = values in this interval not displayed.    Basic Metabolic Panel:  Recent Labs  Lab 12/07/17 0355 12/08/17 0523  NA 136 136  K 3.0* 3.5  CL 98* 97*  CO2 24 27  GLUCOSE 185* 128*  BUN 93* 97*  CREATININE 3.18* 2.82*  CALCIUM 6.6*  7.1*  MG 2.4 2.7*  PHOS 4.5 5.6*    Lipid Panel:     Component Value Date/Time   CHOL 36 12/07/2017 1517   TRIG 78 12/07/2017 1517   HDL <10 (L) 12/07/2017 1517   CHOLHDL NOT CALCULATED 12/07/2017 1517   VLDL 16 12/07/2017 1517   LDLCALC NOT CALCULATED 12/07/2017 1517   HgbA1c:  Lab Results  Component Value Date   HGBA1C 6.0 (H) 12/07/2017   Urine Drug Screen:     Component Value Date/Time   LABOPIA NONE DETECTED 11/26/2017 2108   COCAINSCRNUR NONE DETECTED 11/28/2017 2108   LABBENZ NONE DETECTED 12/06/2017 2108   AMPHETMU POSITIVE (A) 12/12/2017 2108   THCU NONE DETECTED 11/15/2017 2108   LABBARB NONE DETECTED 12/06/2017 2108    Alcohol Level     Component Value Date/Time   ETH <11 01/11/2013 1226    IMAGING  Ct Head Wo Contrast 12/12/2017 IMPRESSION:  1. No acute intracranial hemorrhage.  2. Multiple low attenuating foci bilaterally most consistent with areas of infarct, subacute or acute. The pattern of infarcts in various vascular distribution suggest a central thromboembolic etiology. Further evaluation with MRI is recommended. Chest CT may provide additional evaluation of the central vasculature.  3. Air within the cavernous sinus as well as within the veins of the face and orbits  of indeterminate etiology but may be related to IV access/injection.     Dg Chest Portable 1 View 12/03/2017 IMPRESSION:  1. Right IJ approach centra venous catheter in place with tip overlying the distal SVC.  2. Tip of the endotracheal tube positioned 3.5 cm above the carina.  3. Interval worsening in mild diffuse pulmonary interstitial edema with small right pleural effusion. Superimposed right basilar opacity may reflect atelectasis or infiltrate.     Dg Chest Portable 1 View 11/25/2017 IMPRESSION:  Chronic interstitial changes right base with slightly more confluent opacification in the right base as early superimposed infection is possible. Mild stable cardiomegaly.      Transthoracic Echocardiogram  12/06/2017 Study Conclusions - Left ventricle: The cavity size was mildly dilated. Wall   thickness was normal. Systolic function was severely reduced. The   estimated ejection fraction was in the range of 20% to 25%.   Diffuse hypokinesis. The study is not technically sufficient to   allow evaluation of LV diastolic function. - Aortic valve: There was severe regurgitation. - Mitral valve: A bioprosthesis was present. - Right ventricle: Systolic function was severely reduced. - Right atrium: The atrium was mildly dilated. - Tricuspid valve: A bioprosthesis was present.  Impressions:  - Severe global reduction in LV systolic function; mild LVE;   trileaflet aortic with possible perforated noncoronary cusp and   severe AI; s/p MVR with large oscillating density c/w vegetation;   some rocking motion posterior annulus (cannot R/O valve   dehiscence); s/p TVR; cannot R/O small oscillating density;   elevated mean gradient 10 mmHg); severely reduced RV function.     PHYSICAL EXAM Vitals:   12/08/17 0830 12/08/17 0845 12/08/17 0900 12/08/17 1119  BP: 118/62  119/60   Pulse: 89 90 91   Resp: (!) 22 (!) 22 (!) 23   Temp: (!) 100.9 F (38.3 C) (!) 101.1 F (38.4 C) (!) 101.1 F (38.4 C)   TempSrc:      SpO2: 98% 98% 98% 98%  Weight:      Height:       Neurological exam : limited as patient is sedated and intubated eyes are closed but opens partially to sternal rub. Fundi not visualized Right gaze preference. He will not look to the left. Blinks to threat on the right but to a lesser extent on the left. Pupils equal reactive. Moves all 4 extremities to painful stimuli but right-sided more than the left upper extremity which moves less. Tone is normal reflexes are present. Plantars are downgoing.    ASSESSMENT/PLAN [name] is a 31 y.o. male with history of IV drug abuse, mitral and tricuspid valve replacements in 2016 at [facility] due to MSSA endocarditis, previous MI, history of cardiac arrest, history of hepatitis C, history of depression, history of pulmonary embolus, cardiomyopathy with chronic systolic heart failure on warfarin, presenting with altered mental status, fever, renal failure, supratherapeutic INR (5.56), and respiratory distress requiring intubation. He did not receive IV t-PA due to warfarin therapy.  Stroke:  Multiple infarcts felt secondary to septic emboli.  Resultant  altered mental status  CT head - Multiple low attenuating foci bilaterally most consistent with areas of infarct, subacute or acute. The pattern of infarcts in various vascular distribution suggest a central thromboembolic etiology.  MRI head - pending  MRA head - pending  Carotid Doppler - not ordered  2D Echo - EF 20-25%. Prosthetic valves consistent with vegetations - possible dehiscence.  LDL -  pending  HgbA1c - pending  VTE prophylaxis - SCDs / warfarin No diet orders on file  warfarin daily prior to admission, now on No antithrombotic INR 6.54  Patient will be counseled to be compliant with his antithrombotic medications  Ongoing aggressive stroke risk factor management  Therapy recommendations:  pending  Disposition:  Pending  Hypertension  Blood pressure running low  Permissive hypertension (OK if < 220/120) but gradually normalize in 5-7 days  Long-term BP goal normotensive  Hyperlipidemia  Home meds:No lipid lowering medications prior to admission  LDL not ordered, goal < 70   Other Stroke Risk Factors  Cigarette smoker - advised to stop smoking  Coronary artery disease  Prosthetic heart valves  IV drug abuse  Cardiomyopathy - EF 20-25%  Other Active Problems  Supra therapeutic warfarin level  Endocarditis MRSA bacteremia -> infectious disease following  Leukocytosis - 33.1  Tachycardia  Renal failure - 63 / 2.96  UDS positive for  amphetamines  Thrombocytopenia - 27K  Hypotension - Levophed  Elevated troponin levels   Plan / Recommendations  The patient is not felt to be a candidate for surgery at this time - poor prognosis.  MRI scan if patient is stable for transfer to the scanner.  No family available at the bedside for discussion.  Hospital day # 3    He has presented with altered mental status in the setting of bacterial endocarditis and has by cerebral infarcts on the CT scan. Likely has septic infarcts. Continue antibiotics.  No family available at the bedside per discussion. Discussed with Dr. Vaughan Browner who stated family leaning towards terminal extubation soon. This patient is critically ill and at significant risk of neurological worsening, death and care requires constant monitoring of vital signs, hemodynamics,respiratory and cardiac monitoring, extensive review of multiple databases, frequent neurological assessment, discussion with family, other specialists and medical decision making of high complexity.I have made any additions or clarifications directly to the above note.This critical care time does not reflect procedure time, or teaching time or supervisory time of PA/NP/Med Resident etc but could involve care discussion time.  I spent 30 minutes of neurocritical care time  in the care of  this patient.  Stroke team to sign off. Kindly call for questions.  Antony Contras, MD Medical Director [facility] Pager: [phone] 12/08/2017 11:50 AM   To contact Stroke Continuity provider, please refer to [email or web address] After hours, contact General Neurology

## 2017-12-08 NOTE — Progress Notes (Signed)
PULMONARY / CRITICAL CARE MEDICINE   Name: Norman Reed MRN: [id number] DOB: [birth year]    ADMISSION DATE:  11/24/2017 CONSULTATION DATE:  11/23/2017  REFERRING MD:  Dr. Rejeana Brock   CHIEF COMPLAINT: AMS   HISTORY OF PRESENT ILLNESS:   31 year old male with PMH of IV Drug Abuse, MV and TV replacement in 2016 at [facility] to ED on 12/22 with fever, AMS, and hypoxia. HR 147, WBC 27.9, Tmax 103.2, Troponin 9.06, LA 4.7, INR 5.56. CXR with chronic interstitial changes with slightly increased right basilar opacification. CT Head with multiple low attenuating foci. Cardiology Consulted. PCCM asked to admit.   Patient was intubated for hypoxia and lethargy and admitted to the ICU. Neurology, cardiology and CT surgery consulted.   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  PAST SURGICAL HISTORY: He  has a past surgical history that includes TEE without cardioversion (N/A, 07/30/2017); Cardiac valve replacement (11/2015); Knee arthroscopy (Left, ~ 2009); transthoracic echocardiogram (10/2015); transthoracic echocardiogram (01/2016); Cardiac catheterization (11/2015); and Cardioversion (11/2015).  Allergies  Allergen Reactions  . Penicillins Other (See Comments)    Childhood reaction Has patient had a PCN reaction causing immediate rash, facial/tongue/throat swelling, SOB or lightheadedness with hypotension: Unk Has patient had a PCN reaction causing severe rash involving mucus membranes or skin necrosis: Unk Has patient had a PCN reaction that  required hospitalization: Unk Has patient had a PCN reaction occurring within the last 10 years: Unk If all of the above answers are "NO", then may proceed with Cephalosporin use.   . Vancomycin Other (See Comments)    Other reaction(s): Other (See Comments) Patient with generalized pruritic erythematous flat indistinct macules, worst on his neck, after completion of vancomycin 1.38 milligrams over 70 minutes. Patient given Benadryl. 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     No current facility-administered medications on file prior to encounter.    Current Outpatient Medications on File Prior to Encounter  Medication Sig  . Buprenorphine HCl-Naloxone HCl (SUBOXONE) 8-2 MG FILM Place 2 Film under the tongue daily.  Marland Kitchen buPROPion (WELLBUTRIN) 75 MG tablet Take 1 tablet (75 mg total) by mouth 3 (three) times daily. (Patient taking differently: Take 150 mg by mouth 2 (two) times daily. )  . carvedilol (COREG) 3.125 MG tablet Take 1 tablet (3.125 mg total) by mouth 2 (two) times daily with a meal.  . gabapentin (NEURONTIN) 600 MG tablet Take 0.5 tablets (300 mg total) by mouth 2 (two) times daily.  . nicotine (NICODERM CQ - DOSED IN MG/24 HOURS) 21 mg/24hr patch Place 1 patch (21 mg total) onto the skin daily.  . permethrin (ELIMITE) 5 % cream Apply 1 application topically as directed. From neck down  . warfarin (COUMADIN) 2.5 MG tablet Take 5 mg by mouth See admin instructions. 5mg  by mouth once daily - take with 7.5mg  = 12.5mg   . warfarin (COUMADIN) 7.5 MG tablet Take 2 tablets (15 mg total) by mouth one time only at 6 PM. (Patient taking differently: Take 7.5 mg by mouth See admin instructions. 7.5mg  by mouth once daily - take with 5mg  = 12.5mg )  .  zolpidem (AMBIEN) 10 MG tablet Take 1 tablet (10 mg total) by mouth at bedtime as needed for sleep.    FAMILY HISTORY:  His indicated that his mother is alive. He indicated that his father is alive. He indicated that  the status of his maternal grandmother is unknown. He indicated that the status of his paternal grandfather is unknown.   SOCIAL HISTORY: He  reports that he has been smoking cigarettes.  He has a 3.25 pack-year smoking history. His smokeless tobacco use includes chew. He reports that he uses drugs. Drugs: Methamphetamines, Marijuana, and Other-see comments. He reports that he does not drink alcohol.  REVIEW OF SYSTEMS:   Unable to review as patient is encephalopathic   SUBJECTIVE:  Continues on Levophed drip.  Agitated when fentanyl is weaned down.  VITAL SIGNS: BP 115/60   Pulse 86   Temp (!) 100.8 F (38.2 C)   Resp (!) 30   Ht 6' (1.829 m)   Wt 193 lb 9 oz (87.8 kg)   SpO2 93%   BMI 26.25 kg/m   HEMODYNAMICS: CVP:  [13 mmHg-16 mmHg] 14 mmHg  VENTILATOR SETTINGS: Vent Mode: PCV FiO2 (%):  [40 %] 40 % Set Rate:  [30 bmp] 30 bmp PEEP:  [5 cmH20] 5 cmH20 Plateau Pressure:  [20 cmH20-24 cmH20] 20 cmH20  INTAKE / OUTPUT: I/O last 3 completed shifts: In: 7054.1 [I.V.:5187.4; Other:10; NG/GT:788.7; IV Piggyback:1068] Out: 2240 [Urine:2140; Emesis/NG output:100]  PHYSICAL EXAMINATION: Gen:      No acute distress,  HEENT:  EOMI, sclera anicteric, ETT in place Neck:     No masses; no thyromegaly Lungs:    Clear to auscultation bilaterally; normal respiratory effort CV:         Regular rate and rhythm; no murmurs Abd:      + bowel sounds; soft, non-tender; no palpable masses, no distension Ext:    No edema; adequate peripheral perfusion Skin:      Warm and dry; no rash Neuro: Sedated, intubated  LABS:  BMET Recent Labs  Lab 12/06/17 0610 12/07/17 0355 12/08/17 0523  NA 135 136 136  K 4.3 3.0* 3.5  CL 105 98* 97*  CO2 13* 24 27  BUN 63* 93* 97*  CREATININE 2.96* 3.18* 2.82*  GLUCOSE 102* 185* 128*    Electrolytes Recent Labs  Lab 12/06/17 0610 12/07/17 0355 12/08/17 0523  CALCIUM 7.5* 6.6* 7.1*  MG 2.3 2.4 2.7*  PHOS 8.0* 4.5 5.6*    CBC Recent  Labs  Lab 12/06/17 0610 12/07/17 0355 12/08/17 0523  WBC 33.1* 22.7* 21.3*  HGB 12.1* 11.5* 11.6*  HCT 35.7* 32.2* 33.8*  PLT 27* 36* 52*    Coag's Recent Labs  Lab 12/06/17 0610 12/07/17 0846 12/08/17 0523  INR 6.54* 1.83 1.68    Sepsis Markers Recent Labs  Lab 12/02/2017 2107 12/06/17 0146 12/06/17 0446 12/06/17 0447 12/07/17 0355 12/07/17 0846  LATICACIDVEN 4.7* 6.5*  --  7.5*  --  2.4*  PROCALCITON 57.08  --  95.18  --  >150.00  --     ABG Recent Labs  Lab 12/06/17 0401 12/07/17 0426 12/08/17 0500  PHART 7.240* 7.457* 7.424  PCO2ART 28.2* 33.6 39.7  PO2ART 188.0* 78.0* 153*    Liver Enzymes Recent Labs  Lab 11/21/2017 1710  AST 134*  ALT 52  ALKPHOS 133*  BILITOT 4.5*  ALBUMIN 2.9*    Cardiac Enzymes Recent Labs  Lab 12/06/17 0446 12/06/17 1058 12/06/17 1641  TROPONINI 10.33* 9.56* 10.60*  Glucose Recent Labs  Lab 12/07/17 1118 12/07/17 1554 12/07/17 1926 12/07/17 2335 12/08/17 0314 12/08/17 0712  GLUCAP 133* 141* 99 119* 128* 113*    Imaging Dg Chest Port 1 View  Result Date: 12/08/2017 CLINICAL DATA:  Respiratory failure EXAM: PORTABLE CHEST 1 VIEW COMPARISON:  12/07/2017 FINDINGS: Support devices are stable. Postoperative changes from valve replacement. Cardiomegaly. Bilateral airspace opacities, likely pulmonary edema. No pneumothorax. IMPRESSION: Continued pulmonary edema pattern, slightly worsened since prior study. Electronically Signed   By: Charlett Nose M.D.   On: 12/08/2017 07:11    STUDIES:  CXR 12/22 > Chronic interstitial changes right base with slightly more confluent opacification in the right base as early superimposed infection is possible.Mild stable cardiomegaly CT Head 12/22 > 1. No acute intracranial hemorrhage. 2. Multiple low attenuating foci bilaterally most consistent with areas of infarct, subacute or acute. The pattern of infarcts in various vascular distribution suggest a central  thromboembolic etiology. Further evaluation with MRI is recommended. Chest CT may provide additional evaluation of the central vasculature. 3. Air within the cavernous sinus as well as within the veins of the face and orbits of indeterminate etiology but may be related to IV access/injection. Echo 12/23 >> EF 25%, diffuse hypokinesis with severe regurgitation.  Large vegetation on mitral valve.  Severely reduced RV function.  CULTURES: Blood 12/23 >> MRSA Blood 12/24 >> Urine 12/23 >> Sputum 12/23 >>  ANTIBIOTICS: Rocephin 12/23 Daptomycin 12/23 >>  Rifmapin 12/23 >>  SIGNIFICANT EVENTS: 12/22 > Presents to ED   LINES/TUBES: ETT 12/23 >>  CVL 12/23 >>  DISCUSSION: 31 year old male with h/o of IVDA and MSSA endocarditis s/p MVR and TVR at [facility] in 2016. Presents with AMS, CT Head with low attenuating foci bilaterally. ECHO with large vegetation of prosthetic mitral valve and mitral stenosis with EF 20%. Intubated.   ASSESSMENT / PLAN:  PULMONARY A: Acute Hypoxic Respiratory Failure P:   Continue full vent support Unable to wean due to agitation. He may end up needing a trach  Change PCV to PRVC. Follow CXR   CARDIOVASCULAR A:  Septic Shock  Endocarditis s/p MV and TV Bioprostetic replacement in 2016 Chronic Systolic Heart Failure (EF 45-50, aortic valve has some calcification and is mildly leaky)  Troponin 11.30 P:  Appreciate recommendations from cardiology and cardiothoracic surgery He is not a candidate for surgery No need for anticoagulation as the valves are bioprosthetic  RENAL A:   Acute Kidney Injury > improving Anion Gap Metabolic Acidosis  P:   Follow urine output and Cr  GASTROINTESTINAL A:   SUP  P:   Continue tube feeds PPI  HEMATOLOGIC A:   Supratherapeutic INR  On Coumadin as an outpatient. P:  Follow CBC, INR  INFECTIOUS A:   Septic Shock  H/O MSSA Endocarditis PCT 57.08 >> P:   On Daptomycin and Rifampin  Trend WBC and  Fever Curve Trend PCT and LA Follow Culture Data   ENDOCRINE A:   No issues    P:   Trend Glucose   NEUROLOGIC A:   Metabolic Encephalopathy  Left Cerebellum/Right Temporal Lobe /Right Occipital Lobe CVA in setting of septic emboli  H/O Polysubstance Abuse (Heroin and Meth)  UDS + Amphetamines  P:   RASS goal: 0/-1 PRN versed, wean fentanyl prn  FAMILY  - Updates: I updated the mother and brother 12/23 about the poor prognosis. I doubt he wil wean well. We will try to set up a family meeting to discuss goals  of care. If we want to keep going he will need a trach.  - Inter-disciplinary family meet or Palliative Care meeting due by: 12/30     The patient is critically ill with multiple organ system failure and requires high complexity decision making for assessment and support, frequent evaluation and titration of therapies, advanced monitoring, review of radiographic studies and interpretation of complex data.   Critical Care Time devoted to patient care services, exclusive of separately billable procedures, described in this note is 35 minutes.   Chilton GreathousePraveen Korene Dula MD [place] [facility] Pager [phone] If no answer or after 3pm call: [phone] 12/08/2017, 7:23 AM

## 2017-12-09 ENCOUNTER — Inpatient Hospital Stay (HOSPITAL_COMMUNITY): Payer: BLUE CROSS/BLUE SHIELD

## 2017-12-09 DIAGNOSIS — J9601 Acute respiratory failure with hypoxia: Secondary | ICD-10-CM

## 2017-12-09 LAB — BLOOD GAS, ARTERIAL
ACID-BASE EXCESS: 1.4 mmol/L (ref 0.0–2.0)
Acid-Base Excess: 1.5 mmol/L (ref 0.0–2.0)
BICARBONATE: 25.5 mmol/L (ref 20.0–28.0)
Bicarbonate: 25.7 mmol/L (ref 20.0–28.0)
DRAWN BY: 511911
Drawn by: 280981
FIO2: 40
FIO2: 40
LHR: 30 {breaths}/min
LHR: 20 {breaths}/min
O2 SAT: 97.2 %
O2 Saturation: 98.9 %
PATIENT TEMPERATURE: 98.6
PCO2 ART: 39.7 mmHg (ref 32.0–48.0)
PEEP/CPAP: 5 cmH2O
PEEP/CPAP: 5 cmH2O
PH ART: 7.389 (ref 7.350–7.450)
PIP: 20 cmH2O
Patient temperature: 99.8
VT: 600 mL
pCO2 arterial: 43.9 mmHg (ref 32.0–48.0)
pH, Arterial: 7.424 (ref 7.350–7.450)
pO2, Arterial: 153 mmHg — ABNORMAL HIGH (ref 83.0–108.0)
pO2, Arterial: 109 mmHg — ABNORMAL HIGH (ref 83.0–108.0)

## 2017-12-09 LAB — CULTURE, BLOOD (SINGLE): Special Requests: ADEQUATE

## 2017-12-09 LAB — BASIC METABOLIC PANEL
Anion gap: 10 (ref 5–15)
BUN: 100 mg/dL — ABNORMAL HIGH (ref 6–20)
CHLORIDE: 101 mmol/L (ref 101–111)
CO2: 27 mmol/L (ref 22–32)
CREATININE: 2.35 mg/dL — AB (ref 0.61–1.24)
Calcium: 7.2 mg/dL — ABNORMAL LOW (ref 8.9–10.3)
GFR calc non Af Amer: 36 mL/min — ABNORMAL LOW (ref >60)
GFR, EST AFRICAN AMERICAN: 41 mL/min — AB (ref >60)
Glucose, Bld: 144 mg/dL — ABNORMAL HIGH (ref 65–99)
Potassium: 3.4 mmol/L — ABNORMAL LOW (ref 3.5–5.1)
Sodium: 138 mmol/L (ref 135–145)

## 2017-12-09 LAB — CULTURE, BLOOD (ROUTINE X 2)
SPECIAL REQUESTS: ADEQUATE
Special Requests: ADEQUATE

## 2017-12-09 LAB — CBC
HEMATOCRIT: 31.8 % — AB (ref 39.0–52.0)
HEMOGLOBIN: 10.6 g/dL — AB (ref 13.0–17.0)
MCH: 24.4 pg — AB (ref 26.0–34.0)
MCHC: 33.3 g/dL (ref 30.0–36.0)
MCV: 73.1 fL — ABNORMAL LOW (ref 78.0–100.0)
Platelets: 66 10*3/uL — ABNORMAL LOW (ref 150–400)
RBC: 4.35 MIL/uL (ref 4.22–5.81)
RDW: 15.9 % — ABNORMAL HIGH (ref 11.5–15.5)
WBC: 21.8 10*3/uL — ABNORMAL HIGH (ref 4.0–10.5)

## 2017-12-09 LAB — MAGNESIUM: Magnesium: 2.7 mg/dL — ABNORMAL HIGH (ref 1.7–2.4)

## 2017-12-09 LAB — PHOSPHORUS: PHOSPHORUS: 4.9 mg/dL — AB (ref 2.5–4.6)

## 2017-12-09 MED ORDER — GLYCOPYRROLATE 0.2 MG/ML IJ SOLN: 0.2000 mg | INTRAMUSCULAR | Status: DC | PRN

## 2017-12-09 MED ORDER — BIOTENE DRY MOUTH MT LIQD: 15.0000 mL | OROMUCOSAL | Status: DC | PRN

## 2017-12-09 MED ORDER — ACETAMINOPHEN 325 MG PO TABS: 650.0000 mg | ORAL_TABLET | Freq: Four times a day (QID) | ORAL | Status: DC | PRN

## 2017-12-09 MED ORDER — ONDANSETRON HCL 4 MG/2ML IJ SOLN
4.0000 mg | Freq: Four times a day (QID) | INTRAMUSCULAR | Status: DC | PRN
Start: 2017-12-09 — End: 2017-12-10

## 2017-12-09 MED ORDER — SODIUM CHLORIDE 0.9 % IV SOLN
2.0000 mg/h | INTRAVENOUS | Status: DC
  Administered 2017-12-09 (×2): 2 mg/h via INTRAVENOUS
  Filled 2017-12-09 (×2): qty 10

## 2017-12-09 MED ORDER — MORPHINE BOLUS VIA INFUSION
4.0000 mg | INTRAVENOUS | Status: DC | PRN
  Administered 2017-12-09 (×3): 4 mg via INTRAVENOUS
  Filled 2017-12-09: qty 4

## 2017-12-09 MED ORDER — POLYVINYL ALCOHOL 1.4 % OP SOLN
1.0000 [drp] | Freq: Four times a day (QID) | OPHTHALMIC | Status: DC | PRN
  Filled 2017-12-09: qty 15

## 2017-12-09 MED ORDER — ONDANSETRON 4 MG PO TBDP
4.0000 mg | ORAL_TABLET | Freq: Four times a day (QID) | ORAL | Status: DC | PRN
  Filled 2017-12-09: qty 1

## 2017-12-09 MED ORDER — ACETAMINOPHEN 650 MG RE SUPP: 650.0000 mg | Freq: Four times a day (QID) | RECTAL | Status: DC | PRN

## 2017-12-09 MED ORDER — MIDAZOLAM BOLUS VIA INFUSION
2.0000 mg | INTRAVENOUS | Status: DC | PRN
  Administered 2017-12-09 (×3): 2 mg via INTRAVENOUS
  Filled 2017-12-09: qty 2

## 2017-12-09 MED ORDER — GLYCOPYRROLATE 1 MG PO TABS: 1.0000 mg | ORAL_TABLET | ORAL | Status: DC | PRN

## 2017-12-09 MED ORDER — SODIUM CHLORIDE 0.9 % IV SOLN
20.0000 mg/h | INTRAVENOUS | Status: DC
  Administered 2017-12-09: 10 mg/h via INTRAVENOUS
  Filled 2017-12-09: qty 10

## 2017-12-09 NOTE — Progress Notes (Signed)
PULMONARY / CRITICAL CARE MEDICINE  Name: Norman Reed MRN:   [id number] DOB:   22-Mar-1986             ADMISSION DATE:  12/14/2017 CONSULTATION DATE:  11/22/2017  REFERRING MD:  Dr. Rejeana Brock   CHIEF COMPLAINT: AMS   HISTORY OF PRESENT ILLNESS:   31 year old male with PMH of IV Drug Abuse, MV and TV replacement in 2016 at [facility] to ED on 12/22 with fever, AMS, and hypoxia. HR 147, WBC 27.9, Tmax 103.2, Troponin 9.06, LA 4.7, INR 5.56. CXR with chronic interstitial changes with slightly increased right basilar opacification. CT Head with multiple low attenuating foci. Cardiology Consulted. PCCM asked to admit.   Patient was intubated for hypoxia and lethargy and admitted to the ICU. Neurology, cardiology and CT surgery consulted.   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  PAST SURGICAL HISTORY: He  has a past surgical history that includes TEE without cardioversion (N/A, 07/30/2017); Cardiac valve replacement (11/2015); Knee arthroscopy (Left, ~ 2009); transthoracic echocardiogram (10/2015); transthoracic echocardiogram (01/2016); Cardiac catheterization (11/2015); and Cardioversion (11/2015).       Allergies  Allergen Reactions  . Penicillins Other (See Comments)    Childhood reaction Has patient had a PCN reaction causing immediate rash, facial/tongue/throat swelling, SOB or lightheadedness with hypotension: Unk Has patient had a PCN reaction causing severe rash involving mucus membranes or skin necrosis: Unk Has patient  had a PCN reaction that required hospitalization: Unk Has patient had a PCN reaction occurring within the last 10 years: Unk If all of the above answers are "NO", then may proceed with Cephalosporin use.   . Vancomycin Other (See Comments)    Other reaction(s): Other (See Comments) Patient with generalized pruritic erythematous flat indistinct macules, worst on his neck, after completion of vancomycin 1.38 milligrams over 70 minutes. Patient given Benadryl. 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     No current facility-administered medications on file prior to encounter.        Current Outpatient Medications on File Prior to Encounter  Medication Sig  . Buprenorphine HCl-Naloxone HCl (SUBOXONE) 8-2 MG FILM Place 2 Film under the tongue daily.  Marland Kitchen buPROPion (WELLBUTRIN) 75 MG tablet Take 1 tablet (75 mg total) by mouth 3 (three) times daily. (Patient taking differently: Take 150 mg by mouth 2 (two) times daily. )  . carvedilol (COREG) 3.125 MG tablet Take 1 tablet (3.125 mg total) by mouth 2 (two) times daily with a meal.  . gabapentin (NEURONTIN) 600 MG tablet Take 0.5 tablets (300 mg total) by mouth 2 (two) times daily.  . nicotine (NICODERM CQ - DOSED IN MG/24 HOURS) 21 mg/24hr patch Place 1 patch (21 mg total) onto the skin daily.  . permethrin (ELIMITE) 5 % cream Apply 1 application topically as directed. From neck down  . warfarin (COUMADIN) 2.5 MG tablet Take 5 mg by mouth See admin instructions. 5mg  by mouth once daily - take with 7.5mg  = 12.5mg   . warfarin (COUMADIN) 7.5 MG tablet Take 2 tablets (15 mg total) by mouth one time only at 6 PM. (Patient taking  differently: Take 7.5 mg by mouth See admin instructions. 7.5mg  by mouth once daily - take with 5mg  = 12.5mg )  . zolpidem (AMBIEN) 10 MG tablet Take 1 tablet (10 mg total) by mouth at bedtime as needed for sleep.    FAMILY HISTORY:  His indicated that his mother is alive. He indicated that  his father is alive. He indicated that the status of his maternal grandmother is unknown. He indicated that the status of his paternal grandfather is unknown.   SOCIAL HISTORY: He  reports that he has been smoking cigarettes.  He has a 3.25 pack-year smoking history. His smokeless tobacco use includes chew. He reports that he uses drugs. Drugs: Methamphetamines, Marijuana, and Other-see comments. He reports that he does not drink alcohol.  REVIEW OF SYSTEMS:   Unable to review as patient is encephalopathic   SUBJECTIVE:  Continues on Levophed drip.  Agitated when fentanyl is weaned down.  VITAL SIGNS: BP 115/60   Pulse 86   Temp (!) 100.8 F (38.2 C)   Resp (!) 30   Ht 6' (1.829 m)   Wt 193 lb 9 oz (87.8 kg)   SpO2 93%   BMI 26.25 kg/m   HEMODYNAMICS: CVP:  [13 mmHg-16 mmHg] 14 mmHg  VENTILATOR SETTINGS: Vent Mode: PCV FiO2 (%):  [40 %] 40 % Set Rate:  [30 bmp] 30 bmp PEEP:  [5 cmH20] 5 cmH20 Plateau Pressure:  [20 cmH20-24 cmH20] 20 cmH20  INTAKE / OUTPUT: I/O last 3 completed shifts: In: 7054.1 [I.V.:5187.4; Other:10; NG/GT:788.7; IV Piggyback:1068] Out: 2240 [Urine:2140; Emesis/NG output:100]  PHYSICAL EXAMINATION: Gen:       No acute distress,  HEENT:  EOMI, sclera anicteric, ETT in place. Multiple flame hemorrhages Neck:     No masses; no thyromegaly Lungs:    Consolidation and crackles at both bases; normal respiratory effort CV:         Regular rate and rhythm; EDM 2/6 Abd:        + bowel sounds; soft, non-tender; no palpable masses, no distension. Ext:         No edema; adequate peripheral perfusion Skin:      Warm and dry; petechial rash Neuro:   Sedated, intubated. Agitation with stimulation, not following commands/  LABS:  BMET LastLabs       Recent Labs  Lab 12/06/17 0610 12/07/17 0355 12/08/17 0523  NA 135 136 136  K 4.3 3.0* 3.5  CL 105 98* 97*  CO2 13* 24 27  BUN 63* 93* 97*  CREATININE 2.96* 3.18* 2.82*  GLUCOSE 102* 185*  128*      Electrolytes LastLabs       Recent Labs  Lab 12/06/17 0610 12/07/17 0355 12/08/17 0523  CALCIUM 7.5* 6.6* 7.1*  MG 2.3 2.4 2.7*  PHOS 8.0* 4.5 5.6*      CBC LastLabs       Recent Labs  Lab 12/06/17 0610 12/07/17 0355 12/08/17 0523  WBC 33.1* 22.7* 21.3*  HGB 12.1* 11.5* 11.6*  HCT 35.7* 32.2* 33.8*  PLT 27* 36* 52*      Coag's LastLabs       Recent Labs  Lab 12/06/17 0610 12/07/17 0846 12/08/17 0523  INR 6.54* 1.83 1.68      Sepsis Markers LastLabs          Recent Labs  Lab 11/24/2017 2107 12/06/17 0146 12/06/17 0446 12/06/17 0447 12/07/17 0355 12/07/17 0846  LATICACIDVEN 4.7* 6.5*  --  7.5*  --  2.4*  PROCALCITON 57.08  --  95.18  --  >150.00  --       ABG LastLabs       Recent Labs  Lab 12/06/17 0401 12/07/17 0426 12/08/17 0500  PHART 7.240* 7.457* 7.424  PCO2ART 28.2* 33.6 39.7  PO2ART 188.0* 78.0* 153*      Liver Enzymes LastLabs     Recent Labs  Lab 12/02/2017 1710  AST 134*  ALT 52  ALKPHOS 133*  BILITOT 4.5*  ALBUMIN 2.9*      Cardiac Enzymes LastLabs       Recent Labs  Lab 12/06/17 0446 12/06/17 1058 12/06/17 1641  TROPONINI 10.33* 9.56* 10.60*      Glucose LastLabs          Recent Labs  Lab 12/07/17 1118 12/07/17 1554 12/07/17 1926 12/07/17 2335 12/08/17 0314 12/08/17 0712  GLUCAP 133* 141* 99 119* 128* 113*      Imaging Dg Chest Port 1 View  Result Date: 12/08/2017 CLINICAL DATA:  Respiratory failure EXAM: PORTABLE CHEST 1 VIEW COMPARISON:  12/07/2017 FINDINGS: Support devices are stable. Postoperative changes from valve replacement. Cardiomegaly. Bilateral airspace opacities, likely pulmonary edema. No pneumothorax. IMPRESSION: Continued pulmonary edema pattern, slightly worsened since prior study. Electronically Signed   By: Charlett Nose M.D.   On: 12/08/2017 07:11    STUDIES:  CXR 12/22 > Chronic interstitial changes right base with  slightly more confluent opacification in the right base as early superimposed infection is possible.Mild stable cardiomegaly CT Head 12/22 > 1. No acute intracranial hemorrhage. 2. Multiple low attenuating foci bilaterally most consistent with areas of infarct, subacute or acute. The pattern of infarcts in various vascular distribution suggest a central thromboembolic etiology. Further evaluation with MRI is recommended. Chest CT may provide additional evaluation of the central vasculature. 3. Air within the cavernous sinus as well as within the veins of the face and orbits of indeterminate etiology but may be related to IV access/injection. Echo 12/23 >> EF 25%, diffuse hypokinesis with severe regurgitation.  Large vegetation on mitral valve.  Severely reduced RV function.  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 Presents with AMS, CT Head with low attenuating foci bilaterally. ECHO with large vegetation of prosthetic mitral valve and mitral stenosis with EF 20%. Intubated.   ASSESSMENT / PLAN:  PULMONARY A: Acute Hypoxic Respiratory Failure P:   Continue full vent support Unable to wean due to agitation. He may end up needing a trach  Change PCV to PRVC. Follow CXR   CARDIOVASCULAR A:  Septic Shock  Endocarditis s/p MV and TV Bioprostetic replacement in 2016 Chronic Systolic Heart Failure (EF 45-50, aortic valve has some calcification and is mildly leaky)  Troponin 11.30 P:  Appreciate recommendations from cardiology and cardiothoracic surgery He is not a candidate for surgery Family has decided to withdraw active care.   Will initiate comfort care.   RENAL A:    Acute Kidney Injury > improving Anion Gap Metabolic Acidosis  P:   Follow urine output and Cr  GASTROINTESTINAL A:   SUP  P:   Continue tube feeds PPI  HEMATOLOGIC A:   Supratherapeutic INR  On Coumadin as an outpatient. P:  Follow CBC,  INR  INFECTIOUS A:   Septic Shock  H/O MSSA Endocarditis PCT 57.08 >> P:   On Daptomycin and Rifampin  Trend WBC and Fever Curve Trend PCT and LA Follow Culture Data   ENDOCRINE A:   No issues    P:   Trend Glucose   NEUROLOGIC A:   Metabolic Encephalopathy  Left Cerebellum/Right Temporal Lobe /Right Occipital Lobe CVA in setting of septic emboli  H/O Polysubstance Abuse (Heroin and Meth)  UDS + Amphetamines  P:   RASS goal: 0/-1 PRN versed, wean fentanyl prn  FAMILY  - Updates: I updated the father, mother and brother today.  The patient is critically ill with multiple organ system failure and requires high complexity decision making for assessment and support, frequent evaluation and titration of therapies, advanced monitoring, review of radiographic studies and interpretation of complex data.   Critical Care Time devoted to patient care services, exclusive of separately billable procedures, described in this note is 45 minutes.    Lynnell Catalanavi Gwenevere Goga, MD  Pulmonary and Critical Care Medicine [facility] Pager: [phone]  12/09/2017, 12:53 PM

## 2017-12-09 NOTE — Progress Notes (Signed)
[facility]  [facility] Infusion Coordinator will follow pt with ID team  to support home infusion services at DC as ordered.  If patient discharges after hours, please call [phone].   Sedalia Muta 12/09/2017, 7:10 AM

## 2017-12-09 NOTE — Progress Notes (Signed)
Pts father, mother and brother now at bedside and wanting to speak with MD regarding comfort care. Dr. Mervyn Skeeters informed and will be in to speak with family shortly.

## 2017-12-09 NOTE — Procedures (Signed)
Extubation Procedure Note  Patient Details:   Name: Norman Reed DOB: 03/25/1986 MRN: [id number]   Airway Documentation:     Evaluation  O2 sats: stable throughout Complications: No apparent complications Patient did tolerate procedure well. Bilateral Breath Sounds: Clear, Diminished   No   Patient one way extubated per MD order and per family request.  Patient placed on 4L nasal cannula, per order, for patient comfort.  Patient tolerated well.  Will continue to monitor.   Durwin Glaze 12/09/2017, 1:58 PM

## 2017-12-12 LAB — CULTURE, BLOOD (ROUTINE X 2)
Culture: NO GROWTH
Special Requests: ADEQUATE

## 2017-12-15 NOTE — Progress Notes (Signed)
DNR Comfort Care Pt PEA as witnessed by Eddie North, RN at 01:56 on 12/11/27. Two nurse ausculation verification of asystole, Eddie North, RN & Nechama Guard, RN.  Pt family at bedside and aware of time of death. All pt belongings given to pt's mother. Pt's family declined chaplain counseling. MD aware.

## 2017-12-15 NOTE — Progress Notes (Signed)
30cc Morphine & 25cc Versed wasted in sink, witnessed by Eddie North, RN.

## 2017-12-15 DEATH — deceased

## 2018-01-03 IMAGING — CT CT HEAD W/O CM
3 series · 14 of 47 positions shown, 16 images · non-contrast
Comparison: Head CT dated 07/28/2017

CLINICAL DATA: 31-year-old male with altered mental status.

EXAM:
CT HEAD WITHOUT CONTRAST
TECHNIQUE: Contiguous axial images were obtained from the base of the skull
through the vertex without intravenous contrast.

[Series 3: head 5.0 h30s · axial · 0.44mm/px · z∈[-85,+50]mm · 8 of 33 slices shown, 10 images]
[im 3/33  brain]
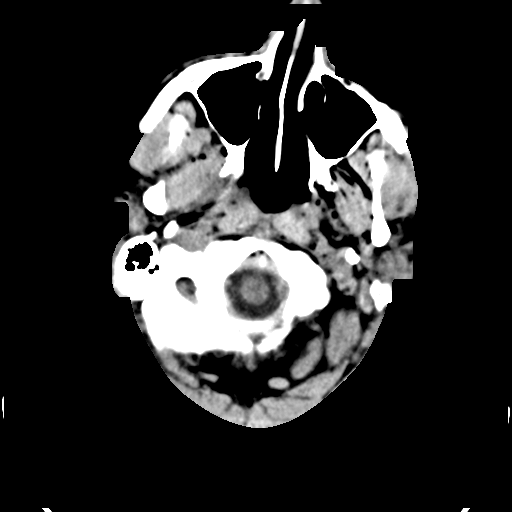
[im 3/33  bone]
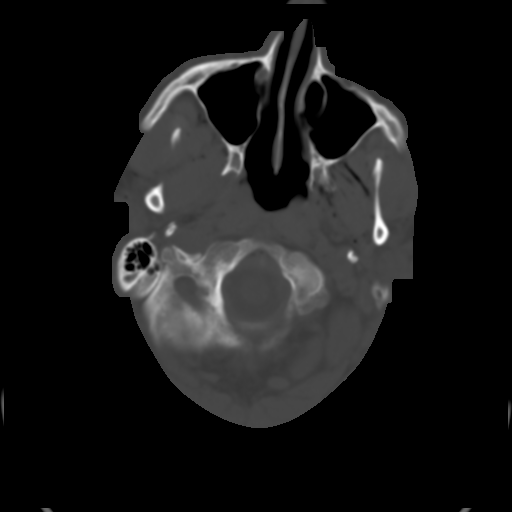
[im 7/33  brain]
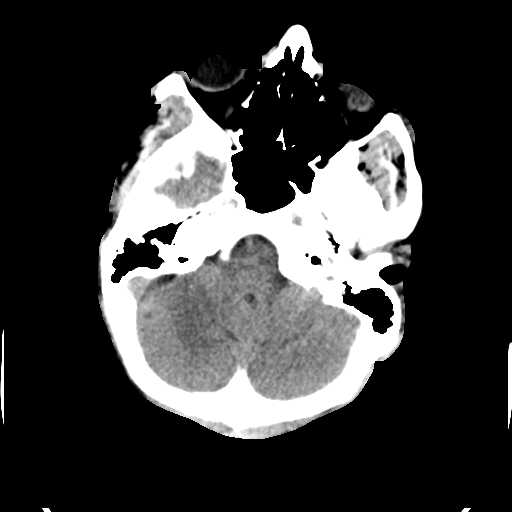
[im 10/33  brain]
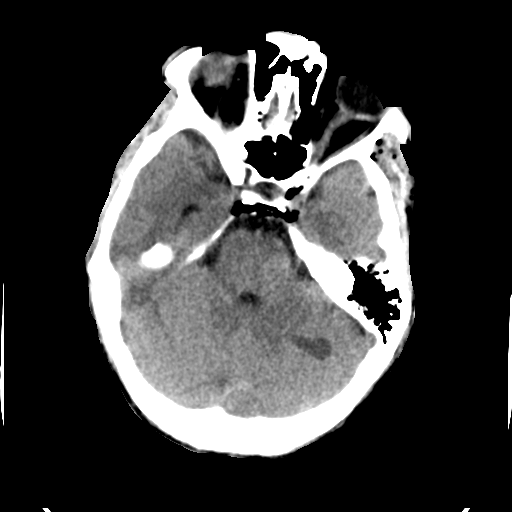
[im 15/33  brain]
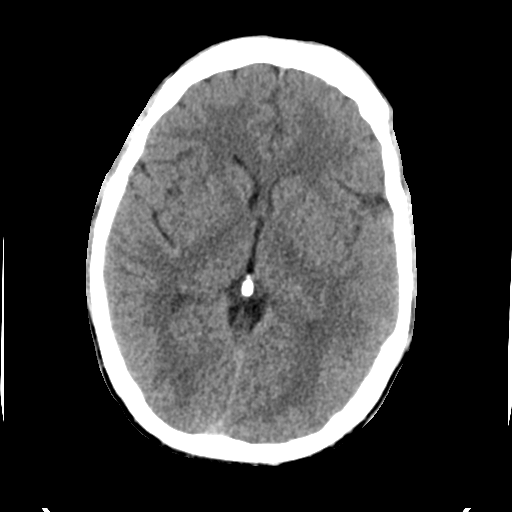
[im 18/33  brain]
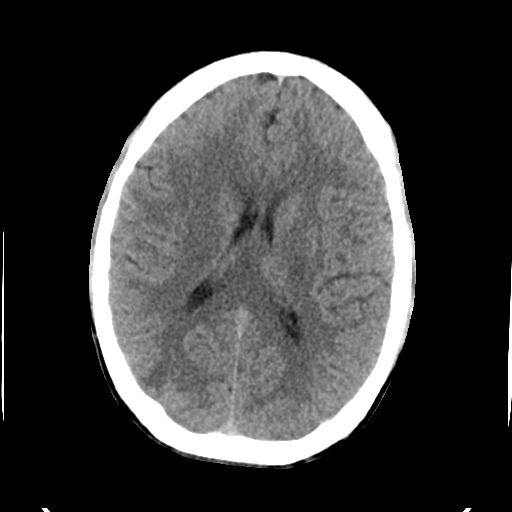
[im 18/33  bone]
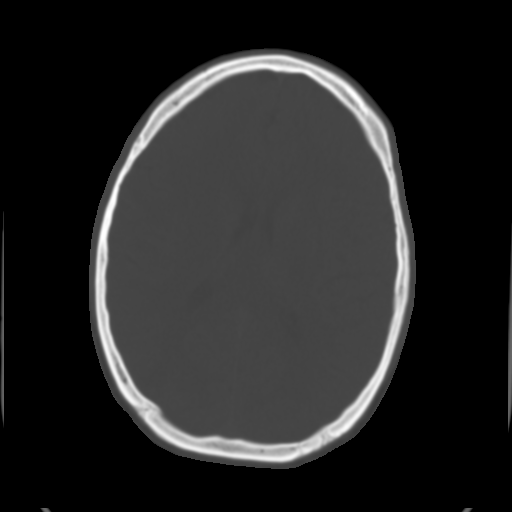
[im 23/33  brain]
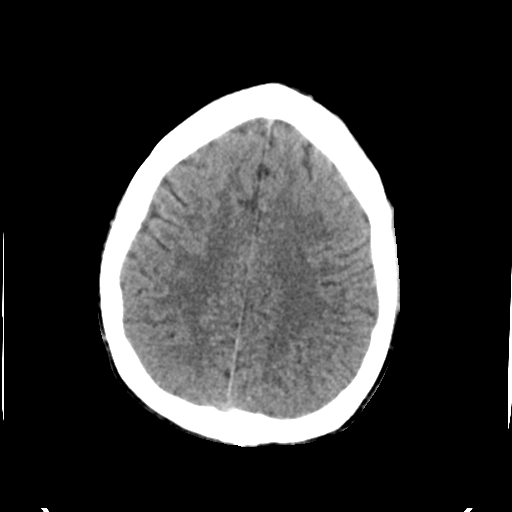
[im 26/33  brain]
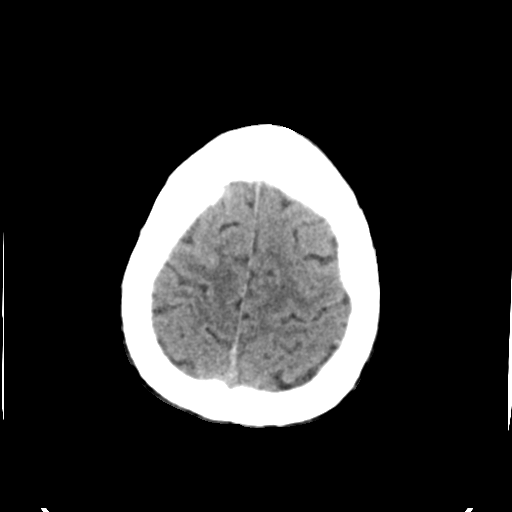
[im 30/33  brain]
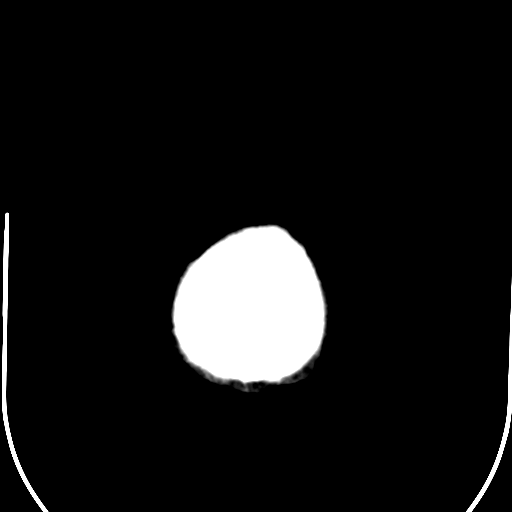

[Series 5: head 3.0 mpr cor · coronal · 0.33mm/px · 3 of 71 slices shown]
[im 24/71  brain]
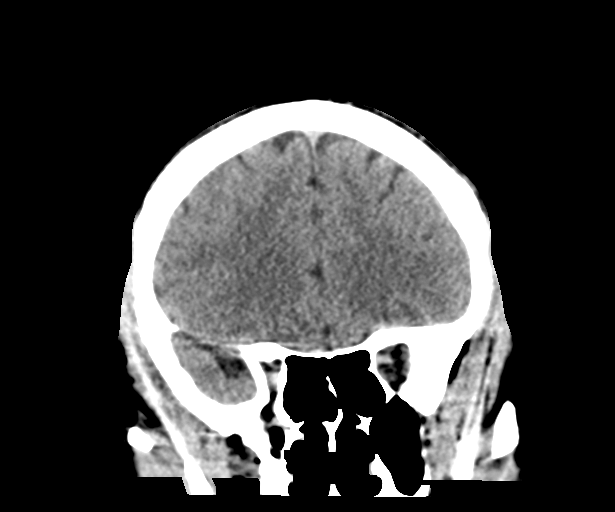
[im 32/71  brain]
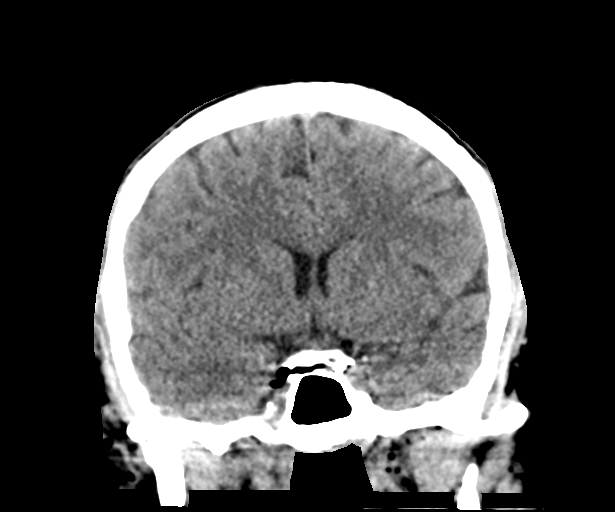
[im 39/71  brain]
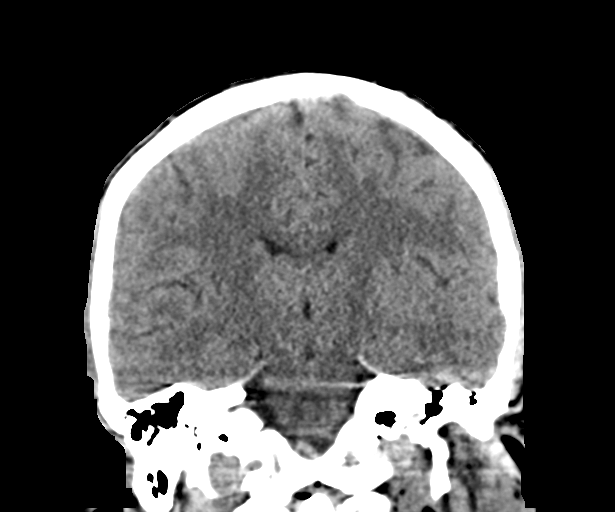

[Series 6: head 3.0 mpr sag · sagittal · 0.38mm/px · 3 of 66 slices shown]
[im 22/66  brain]
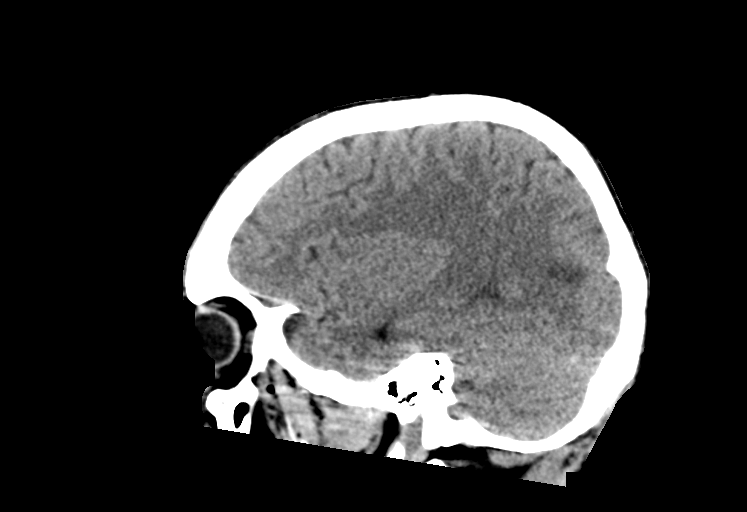
[im 33/66  brain]
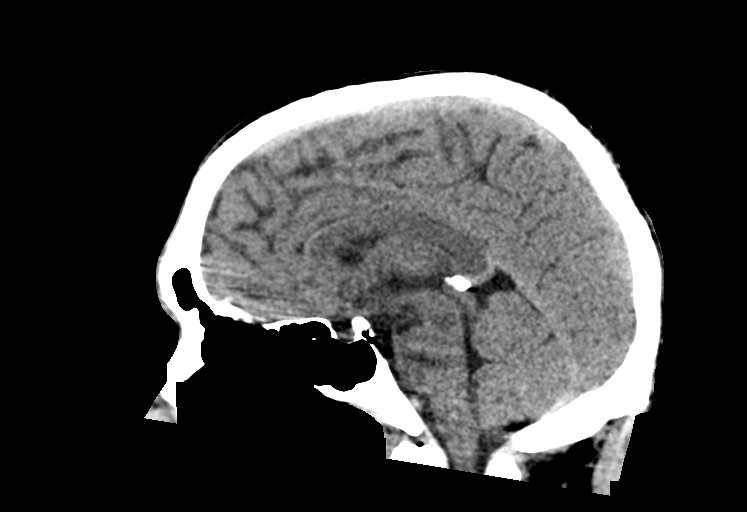
[im 44/66  brain]
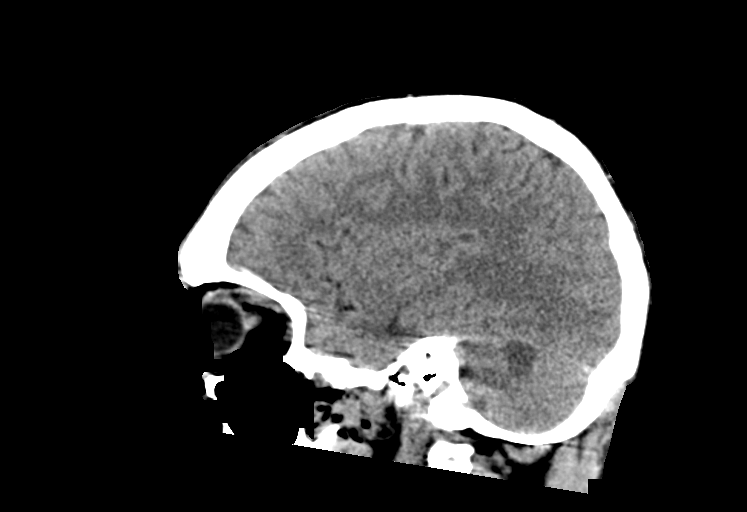

[14 of 47 positions shown; findings below may reference images not displayed]

FINDINGS: Brain: There is a 1 x 2 cm low attenuating area in the left
cerebellum. A similar low attenuating area is noted in the right
temporal lobe posteriorly as well as a focal low attenuating area in
the gray-white matter junction of the right occipital lobe. Findings
most consistent with areas of infarct, acute or subacute. Further
evaluation with MRI is recommended. The ventricles and sulci
appropriate size for patient's age. There is no acute intracranial
hemorrhage. No midline shift. No extra-axial fluid collection.

Vascular: No hyperdense vessel or unexpected calcification.

Skull: Normal. Negative for fracture or focal lesion.

Sinuses/Orbits: Extensive amount of venous gas in the facial
musculature with involvement of the left masticator musculature,
orbits, and air within the cavernous sinus.

Other: None
IMPRESSION: 1. No acute intracranial hemorrhage.
2. Multiple low attenuating foci bilaterally most consistent with
areas of infarct, subacute or acute. The pattern of infarcts in
various vascular distribution suggest a central thromboembolic
etiology. Further evaluation with MRI is recommended. Chest CT may
provide additional evaluation of the central vasculature.
3. Air within the cavernous sinus as well as within the veins of the
face and orbits of indeterminate etiology but may be related to IV
access/injection.
These results were called by telephone at the time of interpretation
on 12/05/2017 at [DATE] to Dr. OG ISAIS , who verbally
acknowledged these results.

## 2018-01-15 NOTE — Accreditation Note (Signed)
Pursuant to regulation 482.13 (G) (3) use of soft wrist restraints was logged on 12/16/2017 at 1041 by Burnard Leigh.Marland Kitchen

## 2018-01-15 NOTE — Discharge Summary (Signed)
Physician Discharge Summary  Patient ID: Norman Reed MRN: [id number] DOB/AGE: [birth year] 30 y.o.  Admit date: 12/07/2017 Discharge date: 12/18/2017  Admission Diagnoses: Altered mental status  Discharge Diagnoses:  Active Problems:   Endocarditis   Acute respiratory failure (HCC)   Cerebral embolism with cerebral infarction Septic shock Mitral valve endocarditis MRSA bacteremia  Discharged Condition: Deceased  Hospital Course:  32 year old male with PMH of IV Drug Abuse, MV and TV replacement in 2016 at [facility] to ED on 12/22 with fever, AMS, and hypoxia. HR 147, WBC 27.9, Tmax 103.2, Troponin 9.06, LA 4.7, INR 5.56. CXR with chronic interstitial changes with slightly increased right basilar opacification. CT Head with multiple low attenuating foci. PCCM asked to admit.   Patient was intubated for hypoxia and lethargy and admitted to the ICU. Neurology, cardiology, infectious diseaseand CT surgery were consulted. Echocardiogram showed a large vegetation on mitral valve with severely reduced cardiac function. Blood cultures showed MRSA bacteremia. He was not a candidate for valve surgery per CT surgery. Overall he had a very poor prognosis with poor chance of recovery. He remained vent and pressor dependent.   A family meeting was held on 12/25 with his father and mother. We discussed the fact that he will continue to have stokes from the large vegetation on the mitral valve and it would be difficult to control the infection with antibiotics alone. He cannot be weaned off the ventilator due to these issues. Trach and PEG was discussed but the family did not want him to suffer through that. They have decided to transition of comfort care. [name] was updated over the phone. He underwent terminal extubation on 2017-12-24 and passed away on December 25, 2017  Consults: Neurology, cardiology, infectious disease, cardiothoracic surgery  Disposition: 20-Expired   Allergies as of  2017/12/25      Reactions   Penicillins Other (See Comments)   Childhood reaction Has patient had a PCN reaction causing immediate rash, facial/tongue/throat swelling, SOB or lightheadedness with hypotension: Unk Has patient had a PCN reaction causing severe rash involving mucus membranes or skin necrosis: Unk Has patient had a PCN reaction that required hospitalization: Unk Has patient had a PCN reaction occurring within the last 10 years: Unk If all of the above answers are "NO", then may proceed with Cephalosporin use.   Vancomycin Other (See Comments)   Other reaction(s): Other (See Comments) Patient with generalized pruritic erythematous flat indistinct macules, worst on his neck, after completion of vancomycin 1.38 milligrams over 70 minutes. Patient given Benadryl. 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       Medication List    ASK your doctor about these medications   Buprenorphine HCl-Naloxone HCl 8-2 MG Film Commonly known as:  SUBOXONE Place 2 Film under the tongue daily.   buPROPion 75 MG tablet Commonly known as:  WELLBUTRIN Take 1 tablet (75 mg total) by mouth 3 (three) times daily.   carvedilol 3.125 MG tablet Commonly known as:  COREG Take 1 tablet (3.125 mg total) by mouth 2 (two) times daily with a meal.   gabapentin 600 MG tablet Commonly known as:  NEURONTIN Take 0.5 tablets (300 mg total) by mouth 2 (two) times daily.   nicotine 21 mg/24hr patch Commonly known as:  NICODERM CQ - dosed in mg/24 hours Place 1 patch (21 mg total) onto the skin daily.   permethrin 5 % cream Commonly known as:  ELIMITE Apply 1 application topically as directed. From  neck down   warfarin 2.5 MG tablet Commonly known as:  COUMADIN Take 5 mg by mouth See admin instructions. 5mg  by mouth once daily - take with 7.5mg  = 12.5mg    warfarin 7.5 MG tablet Commonly known as:  COUMADIN Take 2 tablets (15 mg total) by mouth one time only at 6 PM.    zolpidem 10 MG tablet Commonly known as:  AMBIEN Take 1 tablet (10 mg total) by mouth at bedtime as needed for sleep.        Signed: Artavius Reed 12/18/2017, 9:08 AM

## 2018-01-15 NOTE — Accreditation Note (Signed)
Restraints not reported to CMS Pursuant to regulation 482.13 use of soft restraints was logged 12/22/2016 at 1403

## 2018-12-15 DEATH — deceased

## 2024-03-27 ENCOUNTER — Other Ambulatory Visit: Payer: Self-pay | Admitting: Family Medicine
